# Patient Record
Sex: Male | Born: 1945 | Race: White | Hispanic: No | Marital: Married
Health system: Midwestern US, Community
[De-identification: ages and names within clinical notes are randomized; demographics above are authoritative.]

## PROBLEM LIST (undated history)

## (undated) DIAGNOSIS — E785 Hyperlipidemia, unspecified: Principal | ICD-10-CM

## (undated) DIAGNOSIS — R7301 Impaired fasting glucose: Secondary | ICD-10-CM

## (undated) DIAGNOSIS — J3801 Paralysis of vocal cords and larynx, unilateral: Secondary | ICD-10-CM

## (undated) DIAGNOSIS — D485 Neoplasm of uncertain behavior of skin: Secondary | ICD-10-CM

## (undated) DIAGNOSIS — R0602 Shortness of breath: Secondary | ICD-10-CM

## (undated) DIAGNOSIS — R195 Other fecal abnormalities: Secondary | ICD-10-CM

## (undated) DIAGNOSIS — M5412 Radiculopathy, cervical region: Secondary | ICD-10-CM

## (undated) DIAGNOSIS — D649 Anemia, unspecified: Secondary | ICD-10-CM

## (undated) DIAGNOSIS — Z01818 Encounter for other preprocedural examination: Secondary | ICD-10-CM

## (undated) DIAGNOSIS — I10 Essential (primary) hypertension: Secondary | ICD-10-CM

## (undated) DIAGNOSIS — I119 Hypertensive heart disease without heart failure: Secondary | ICD-10-CM

## (undated) DIAGNOSIS — E7800 Pure hypercholesterolemia, unspecified: Secondary | ICD-10-CM

## (undated) DIAGNOSIS — K621 Rectal polyp: Secondary | ICD-10-CM

## (undated) DIAGNOSIS — R1900 Intra-abdominal and pelvic swelling, mass and lump, unspecified site: Secondary | ICD-10-CM

## (undated) MED ORDER — AMLODIPINE 2.5 MG TAB
2.5 mg | ORAL_TABLET | Freq: Every day | ORAL | Status: DC
Start: ? — End: 2012-07-29

## (undated) MED ORDER — OMEGA-3 ACID ETHYL ESTERS 1 GRAM CAP
1 gram | ORAL_CAPSULE | Freq: Every evening | ORAL | Status: DC
Start: ? — End: 2013-08-26

## (undated) MED ORDER — TRIAMCINOLONE ACETONIDE 55 MCG NASAL SPRAY AEROSOL: 55 mcg | NASAL | Status: AC

## (undated) MED ORDER — TRIAMCINOLONE ACETONIDE 55 MCG NASAL SPRAY AEROSOL
55 mcg | NASAL | Status: DC
Start: ? — End: 2012-10-25

## (undated) MED ORDER — SIMVASTATIN 20 MG TAB
20 mg | ORAL_TABLET | Freq: Every evening | ORAL | Status: DC
Start: ? — End: 2013-02-10

## (undated) MED ORDER — POTASSIUM CHLORIDE SR 10 MEQ TAB
10 mEq | ORAL_TABLET | Freq: Every day | ORAL | Status: DC
Start: ? — End: 2013-03-31

## (undated) MED ORDER — POTASSIUM CHLORIDE SR 10 MEQ TAB
10 mEq | ORAL_TABLET | Freq: Every day | ORAL | Status: DC
Start: ? — End: 2012-09-30

## (undated) MED ORDER — CARVEDILOL 12.5 MG TAB
12.5 mg | ORAL_TABLET | Freq: Two times a day (BID) | ORAL | Status: DC
Start: ? — End: 2012-12-30

## (undated) MED ORDER — POTASSIUM CHLORIDE SR 10 MEQ TAB
10 mEq | ORAL_TABLET | Freq: Every day | ORAL | Status: DC
Start: ? — End: 2012-04-01

## (undated) MED ORDER — CARVEDILOL 12.5 MG TAB
12.5 mg | ORAL_TABLET | Freq: Two times a day (BID) | ORAL | Status: DC
Start: ? — End: 2013-11-10

## (undated) MED ORDER — NIACIN ER 500 MG TAB (NIASPAN)
500 mg | ORAL_TABLET | Freq: Every evening | ORAL | Status: DC
Start: ? — End: 2013-04-15

## (undated) MED ORDER — LOSARTAN-HYDROCHLOROTHIAZIDE 100 MG-12.5 MG TAB
ORAL_TABLET | Freq: Every day | ORAL | Status: DC
Start: ? — End: 2013-06-23

## (undated) MED ORDER — AMLODIPINE 2.5 MG TAB
2.5 mg | ORAL_TABLET | ORAL | Status: DC
Start: ? — End: 2012-03-05

## (undated) MED ORDER — AMLODIPINE 2.5 MG TAB
2.5 mg | ORAL_TABLET | Freq: Every day | ORAL | Status: DC
Start: ? — End: 2013-06-23

## (undated) MED ORDER — SIMVASTATIN 20 MG TAB
20 mg | ORAL_TABLET | Freq: Every evening | ORAL | Status: DC
Start: ? — End: 2012-08-12

## (undated) MED ORDER — SIMVASTATIN 20 MG TAB
20 mg | ORAL_TABLET | Freq: Every evening | ORAL | Status: DC
Start: ? — End: 2013-08-06

---

## 2009-04-07 LAB — PSA SCREENING (SCREENING): Prostate Specific Ag: 2.3

## 2009-08-30 NOTE — Patient Instructions (Signed)
Nuclear Stress Test    February 22, 2010 @ 9:00am arrive fifteen minutes early.    [facility] [employer] [facility] - [phone] - Please bring a list of your medications and insurance cards.     Nothing to eat or drink and NO caffeine after midnight, No medication the morning of test. Wear comfortable clothing and shoes. Please be aware that nuclear stress tests are 4-6 hours long.

## 2009-08-30 NOTE — Progress Notes (Signed)
HISTORY OF PRESENT ILLNESS  Raymond [name] is a 64 y.o. male.    HPI  He has been feeling well.  He has rare to occasional brief fluttering.  He has had no prolonged palpitations or sinking feelings.  He denies chest pain, dyspnea, orthopnea or PND.  He has had no syncope.  He denies any symptoms of TIA or amaurosis fugax.  He, as you know, has a history of known coronary artery disease and had an acute inferior wall myocardial infarction on October 12, 2001, and had stenting of the right coronary artery at that time.  In January 2006, he started experiencing frequent palpitations and dizziness with a feeling as if he was dropping down on a roller coaster.  He was admitted to [facility] on February 27, 2004 with an episode of the sinking feeling and nonsustained ventricular tachycardia.  He subsequently underwent cardiac catheterization followed by CABG X3 on March 03, 2004, which consisted of:    1.  LIMA to the LAD.  2.  Sequential SVG to the diagonal and obtuse marginal branch.    His preoperative EF was in the 45% range.  He was treated with amiodarone for nonsustained ventricular tachycardia and has had no recurrence of the sinking feeling or palpitations since the bypass surgery.  He developed a large right pleural effusion which was once drained, but he has had some reaccumulation.  Because of the sinking feeling, he had a 24-hour Holter monitor which failed to reveal a significant cardiac arrhythmia other than PVCs.  He did have the sinking feeling while he was wearing the monitor, but it did not coincide with any significant ventricular arrhythmia.        Review of Systems   Constitutional: Negative for weight loss and malaise/fatigue.   Respiratory: Negative for shortness of breath.    Cardiovascular: Positive for palpitations. Negative for chest pain, orthopnea, claudication, leg swelling and PND.   Neurological: Negative for dizziness and weakness.       Physical Exam    Constitutional: He is oriented to person, place, and time. He appears well-developed and well-nourished.   Eyes: Pupils are equal, round, and reactive to light.   Neck: Normal range of motion. Neck supple. No JVD present.   Cardiovascular: Normal rate, regular rhythm and normal heart sounds.  Exam reveals no gallop and no friction rub.    No murmur heard.  Pulmonary/Chest: Effort normal. He has no rales.   Abdominal: Soft. No tenderness.   Musculoskeletal: Normal range of motion. He exhibits no edema.   Neurological: He is alert and oriented to person, place, and time.     BP 126/72   Pulse 48   Ht 5\' 9"  (1.753 m)   Wt 175 lb (79.379 kg)   BMI 25.84 kg/m2   SpO2 98%    Past Medical History   Diagnosis Date   ??? Coronary artery disease      inferior wall myocardial infarction and stenting of the RCA 9/03; Status post CABG X3 in January 2006.   ??? Myocardial infarction, inferior wall 10/12/01     acute   ??? Hypertension    ??? Hypercholesteremia      low HDL   ??? Ventricular tachycardia, nonsustained    ??? 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 Pleural effusion on right    ??? Echocardiogram 11/08/05     LVH; inferior & inferolateral wall motion abnormality; trace MR; EF 45-50%   ??? S/P cardiac cath  02/29/04     RCA-occluded ostium; p/mLAD-85%; LAD-D1, D2-85-95%; Cx-40-50%; LVEDP 12 mmHg; EF 45%   ??? Myocardial perfusion 12/22/03     small to mod size scarring in mid to basal inf and inferolateral wall w/mild ischemia in mid/basal lateral wall only; mild hypok of inf wall; mild/mod hypok of mid lateral wall; EF 48%; mild LV dilatation         Past Surgical History   Procedure Date   ??? Stent insertion 10/12/01     right coronary artery   ??? Hx heart catheterization 03/03/04     followed by CABG X3   ??? Hx coronary artery bypass graft 03/03/04     LIMA to the LAD. Sequential SVG to the diagonal and obtuse marginal branch   ??? Hx coronary stent placement 10/12/01     RCA stented using 3.5 x 13 mm Express 2 stent         Current outpatient prescriptions    Medication Sig Dispense Refill   ??? niacin ER (NIASPAN) 500 mg tablet Take 1,500 mg by mouth nightly.       ??? aspirin 81 mg tablet Take 81 mg by mouth daily.       ??? simvastatin (ZOCOR) 20 mg tablet Take 20 mg by mouth nightly.       ??? MULTIVITAMIN PO Take 1 Tab by mouth daily.       ??? TRIAMCINOLONE ACETONIDE (NASACORT NA) 1 Spray by Nasal route as needed.       ??? omega-3 acid ethyl esters (LOVAZA) 1 gram capsule Take 1 g by mouth daily (with breakfast).       ??? amlodipine (NORVASC) 2.5 mg tablet Take 2.5 mg by mouth daily.       ??? carvedilol (COREG) 12.5 mg tablet Take 12.5 mg by mouth two (2) times daily (with meals).       ??? losartan-hydrochlorothiazide (HYZAAR) 100-12.5 mg per tablet Take 1 Tab by mouth daily.             EKG: unchanged from previous tracings, sinus bradycardia, RBBB, inf.scar    .   ASSESSMENT and PLAN  Encounter Diagnoses   Code Name Primary?   ??? 414.01 CAD, history of inferior wall myocardial infarction/status post stenting of the RCA in September 2003/status post CABG X3 in January 2006/EF 45%. Yes   ??? 427.1BD Nonsustained ventricular tachycardia    ??? 402.10 Hypertension    ??? 272.0J Hypercholesteremia    ??? 427.69BL PVC's      Raymond [name] continues to do well.  He has had no symptoms to indicate angina or cardiac decompensation.  He has had no symptoms of significant cardiac arrhythmia.  His blood pressure has been under control.  For now, I would simply continue on current medical regimen.

## 2009-09-02 MED ORDER — TRIAMCINOLONE ACETONIDE 55 MCG/ACTUATION NASAL SPRAY, AEROSOL
55 mcg/actuation | Freq: Every day | NASAL | Status: DC
Start: 2009-09-02 — End: 2010-09-19

## 2009-09-06 NOTE — Telephone Encounter (Signed)
rx called [facility]

## 2009-09-06 NOTE — Telephone Encounter (Signed)
Called in nasacort  AQ 74mcg/a  On Thursday-still not at [place]. Please call in

## 2009-09-13 MED ORDER — CARVEDILOL 12.5 MG TAB
12.5 mg | ORAL_TABLET | Freq: Two times a day (BID) | ORAL | Status: DC
Start: 2009-09-13 — End: 2010-06-28

## 2009-09-13 NOTE — Telephone Encounter (Signed)
Last seen 07-13-09.

## 2009-12-06 MED ORDER — NIACIN ER 500 MG TAB (NIASPAN)
500 mg | ORAL_TABLET | Freq: Every evening | ORAL | Status: DC
Start: 2009-12-06 — End: 2011-02-20

## 2009-12-06 NOTE — Telephone Encounter (Signed)
Last seen 07-13-09

## 2009-12-06 NOTE — Telephone Encounter (Signed)
Chart float 1

## 2009-12-06 NOTE — Telephone Encounter (Signed)
Chart to float 1

## 2010-01-04 MED ORDER — AMLODIPINE 2.5 MG TAB
2.5 mg | ORAL_TABLET | Freq: Every day | ORAL | Status: DC
Start: 2010-01-04 — End: 2010-04-04

## 2010-01-04 NOTE — Telephone Encounter (Signed)
Next ov 01-13-10

## 2010-01-07 LAB — CBC WITH AUTOMATED DIFF
ABS. BASOPHILS: 0 10*3/uL (ref 0.0–0.2)
ABS. EOSINOPHILS: 0.3 10*3/uL (ref 0.0–0.4)
ABS. LYMPHOCYTES: 1.4 10*3/uL (ref 0.7–4.5)
ABS. MONOCYTES: 0.3 10*3/uL (ref 0.1–1.0)
ABS. NEUTROPHILS: 2.5 10*3/uL (ref 1.8–7.8)
BASOPHILS: 0 % (ref 0–3)
EOSINOPHILS: 6 % (ref 0–7)
HCT: 37.7 % (ref 36.0–50.0)
HGB: 13 g/dL (ref 12.5–17.0)
LYMPHOCYTES: 31 % (ref 14–46)
MCH: 31.7 pg (ref 27.0–34.0)
MCHC: 34.5 g/dL (ref 32.0–36.0)
MCV: 92 fL (ref 80–98)
MONOCYTES: 8 % (ref 4–13)
NEUTROPHILS: 55 % (ref 40–74)
PLATELET: 138 10*3/uL — ABNORMAL LOW (ref 140–415)
RBC: 4.1 x10E6/uL (ref 4.10–5.60)
RDW: 12.4 % (ref 11.7–15.0)
WBC: 4.5 10*3/uL (ref 4.0–10.5)

## 2010-01-07 LAB — METABOLIC PANEL, COMPREHENSIVE
A-G Ratio: 1.6 (ref 0.6–2.2)
ALT (SGPT): 16 U/L (ref 10–40)
AST (SGOT): 15 U/L (ref 9–44)
Albumin: 3.9 g/dL (ref 3.5–5.3)
Alk. phosphatase: 46 U/L — ABNORMAL LOW (ref 53–128)
BUN: 17 mg/dL (ref 7–25)
Bilirubin, total: 0.6 mg/dL (ref 0.2–1.3)
CO2: 29.3 mmol/L (ref 21.0–32.0)
Calcium: 8.7 mg/dL (ref 8.1–10.4)
Chloride: 107 mEq/L (ref 94–112)
Creatinine: 1.07 mg/dL (ref 0.40–1.40)
GFR est AA: 84 mL/min/{1.73_m2} (ref >59)
GFR est non-AA: 72 mL/min/{1.73_m2} (ref >59)
GLOBULIN, TOTAL: 2.4 g/dL (ref 2.0–4.8)
Glucose: 108 mg/dL — ABNORMAL HIGH (ref 70–100)
Potassium: 3.8 mEq/L (ref 3.6–5.5)
Protein, total: 6.3 g/dL (ref 6.3–8.5)
Sodium: 139 mEq/L (ref 135–145)

## 2010-01-07 LAB — URINALYSIS W/ RFLX MICROSCOPIC
Bilirubin: NEGATIVE
Glucose: NEGATIVE
Ketone: NEGATIVE
Leukocyte Esterase: NEGATIVE
Nitrites: NEGATIVE
Protein: NEGATIVE
Specific Gravity: 1.02 (ref 1.005–1.030)
Urobilinogen: 0.2 mg/dL (ref 0.0–1.9)
pH (UA): 7 (ref 5.0–7.5)

## 2010-01-07 LAB — MICROSCOPIC EXAMINATION
Cast type: NEGATIVE
Casts: NEGATIVE /lpf
Crystal type: NEGATIVE
Crystals: NEGATIVE
Epithelial cells, renal: NEGATIVE /hpf
Mucus: NEGATIVE
Trichomonas: NEGATIVE
WBC: NEGATIVE /hpf (ref 0–?)
Yeast: NEGATIVE

## 2010-01-07 LAB — LIPID PANEL
Cholesterol, total: 132 mg/dL (ref 120–200)
HDL Cholesterol: 49 mg/dL (ref 36–85)
LDL, calculated: 73 mg/dL (ref 0–160)
Triglyceride: 51 mg/dL (ref 36–165)
VLDL, calculated: 10 mg/dL (ref 5–40)

## 2010-01-07 NOTE — Progress Notes (Addendum)
Addended byRoyetta Asal on: 01/07/2010      Modules accepted: Orders

## 2010-01-08 LAB — PSA, DIAGNOSTIC (PROSTATE SPECIFIC AG): Prostate Specific Ag: 5.9 ng/mL — ABNORMAL HIGH (ref 0.0–4.0)

## 2010-01-13 NOTE — Patient Instructions (Signed)
MyChart Activation    Thank you for requesting access to MyChart. Please follow the instructions below to securely access and download your online medical record. MyChart allows you to send messages to your doctor, view your test results, renew your prescriptions, schedule appointments, and more.    How Do I Sign Up?    1. In your internet browser, go to [email or web address].  2. Click on the First Time User? Click Here link in the Sign In box. You will see the New Member Sign Up page.  3. Enter your MyChart Access Code exactly as it appears below. You will not need to use this code after you???ve completed the sign-up process. If you do not sign up before the expiration date, you must request a new code.    MyChart Access Code: [email or web address]  Expires: 04/13/10 08:55 AM (This is the date your MyChart access code will expire)    4. Enter the last four digits of your Social Security Number (xxxx) and Date of Birth (mm/dd/yyyy) as indicated and click Submit. You will be taken to the next sign-up page.  5. Create a MyChart ID. This will be your MyChart login ID and cannot be changed, so think of one that is secure and easy to remember.  6. Create a MyChart password. You can change your password at any time.  7. Enter your Password Reset Question and Answer. This can be used at a later time if you forget your password.   8. Enter your e-mail address. You will receive e-mail notification when new information is available in MyChart.  9. Click Sign Up. You can now view and download portions of your medical record.  10. Click the Download Summary menu link to download a portable copy of your medical information.    Additional Information    If you have questions, please call [phone]. Remember, MyChart is NOT to be used for urgent needs. For medical emergencies, dial 911.

## 2010-01-13 NOTE — Progress Notes (Addendum)
Addended by: Atlee Abide on: 01/13/2010      Modules accepted: Orders, SmartSet

## 2010-01-13 NOTE — Progress Notes (Signed)
Raymond [name], sanders 24-Oct-1945, is a 64 y.o. male, who is seen today for reevaluation of hypertension, atherosclerotic heart disease, elevated PSA, and impaired fasting glucose. He is feeling well and remains active though he doesn't exercise per se. He does cut the grass and does does a lot of work at [employer] and lives with grandchildren but doesn't do any regular walking for exercise anymore. 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 His hypertension has been quite well-controlled on current medication which he takes regularly. Lipids are done well on current medicine is well, his cardiologist added Niaspan to increase his HDL cholesterol. He has arthroscopic heart disease having had an inferior infarction followed by a stent in September of 2003 and then develop more symptoms and required coronary artery bypass surgery in January of 2006. He has done well since then and will be having a stress test next month at the office of his cardiologist. He was found to have elevated PSA of 6 a year ago and is seen a urologist since then. PSA has been lower since then and he has a followup appointment tomorrow with Dr.Auman.      Past Medical History   Diagnosis Date   ??? Coronary artery disease      inferior wall myocardial infarction and stenting of the RCA 9/03; Status post CABG X3 in January 2006.   ??? Myocardial infarction, inferior wall 10/12/01     acute   ??? Hypertension    ??? Hypercholesteremia      low HDL   ??? Ventricular tachycardia, nonsustained    ??? 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 Pleural effusion on right    ??? Echocardiogram 11/08/05     LVH; inferior & inferolateral wall motion abnormality; trace MR; EF 45-50%   ??? S/P cardiac cath 02/29/04     RCA-occluded ostium; p/mLAD-85%; LAD-D1, D2-85-95%; Cx-40-50%; LVEDP 12 mmHg; EF 45%   ??? Myocardial perfusion 12/22/03      small to mod size scarring in mid to basal inf and inferolateral wall w/mild ischemia in mid/basal lateral wall only; mild hypok of inf wall; mild/mod hypok of mid lateral wall; EF 48%; mild LV dilatation   ??? BPH w urinary obs/LUTS    ??? Elevated prostate specific antigen (PSA)    ??? Microscopic hematuria    ??? Hypertension    ??? Right renal cyst    ??? ASHD (arteriosclerotic heart disease)        Current outpatient prescriptions   Medication Sig Dispense Refill   ??? amLODIPine (NORVASC) 2.5 mg tablet Take 1 Tab by mouth daily.  30 Tab  2   ??? niacin ER (NIASPAN) 500 mg tablet Take 3 Tabs by mouth nightly.  270 Tab  4   ??? carvedilol (COREG) 12.5 mg tablet Take 1 Tab by mouth two (2) times a day.  60 Tab  9   ??? Triamcinolone Acetonide (NASACORT AQ) 55 mcg/Actuation Aero nasal spray 2 Sprays by Nasal route daily.  1 Container  11   ??? aspirin 81 mg tablet Take 81 mg by mouth daily.       ??? simvastatin (ZOCOR) 20 mg tablet Take 20 mg by mouth nightly.       ??? MULTIVITAMIN PO Take 1 Tab by mouth daily.       ??? omega-3 acid ethyl esters (LOVAZA) 1 gram capsule Take 1 g by mouth daily (with  breakfast).       ??? losartan-hydrochlorothiazide (HYZAAR) 100-12.5 mg per tablet Take 1 Tab by mouth daily.           He doesn't drink alcohol or smoke cigarettes. He has no drug allergies but is allergic to bee stings.    He is married and retired as noted above.    Review of systems his weight is basically unchanged over the last year, he has no HEENT complaints and does see an eye doctor each year, is no dyspnea on exertion cough PND orthopnea, no exertional chest discomfort and no edema. He also has no nausea vomiting bowel complaints blood in his stools black stools or urinary symptoms.    Visit Vitals   Item Reading   ??? BP 144/68   ??? Pulse 68   ??? Temp(Src) 98.4 ??F (36.9 ??C) (Oral)   ??? Resp 16   ??? Ht 5\' 8"  (1.727 m)   ??? Wt 178 lb 8 oz (80.967 kg)   ??? BMI 27.14 kg/m2        Pupils are equal and react to light, extraocular movements are full, fundi are not checked. Tympanic membranes appear normal. Oral cavity no lesions. Neck reveals no adenopathy or thyromegaly. Carotids are 2+ no bruits. Lungs are clear to percussion. No wheezing rales or rhonchi on auscultation. Heart reveals a regular rhythm with no murmur gallop click or rub. Apical impulse is in the fifth interspace in the midclavicular line. Median sternotomy scar is well-healed. Abdomen is soft and nontender with no postauricular masses and no bruits. Extremities reveal no clubbing cyanosis or edema. Pulses are 2+. Rectal exam was not done that will be done tomorrow by his urologist.    Results for orders placed in visit on 01/07/10   CBC WITH AUTOMATED DIFF   Component Value Range   ??? WBC 4.5  4.0 - 10.5 (x10E3/uL)   ??? RBC 4.10  4.10 - 5.60 (x10E6/uL)   ??? HGB 13.0  12.5 - 17.0 (g/dL)   ??? HCT 37.7  36.0 - 50.0 (%)   ??? MCV 92  80 - 98 (fL)   ??? MCH 31.7  27.0 - 34.0 (pg)   ??? MCHC 34.5  32.0 - 36.0 (g/dL)   ??? RDW 12.4  11.7 - 15.0 (%)   ??? PLATELET 138 (*) 140 - 415 (x10E3/uL)   ??? NEUTROPHILS 55  40 - 74 (%)   ??? LYMPHOCYTES 31  14 - 46 (%)   ??? MONOCYTES 8  4 - 13 (%)   ??? EOSINOPHILS 6  0 - 7 (%)   ??? BASOPHILS 0  0 - 3 (%)   ??? ABSOLUTE NEUTS 2.5  1.8 - 7.8 (x10E3/uL)   ??? ABSOLUTE LYMPHS 1.4  0.7 - 4.5 (x10E3/uL)   ??? ABSOLUTE MONOS 0.3  0.1 - 1.0 (x10E3/uL)   ??? ABSOLUTE EOSINS 0.3  0.0 - 0.4 (x10E3/uL)   ??? ABSOLUTE BASOS 0.0  0.0 - 0.2 (x10E3/uL)   ??? Hematology Comments: CANCELED     METABOLIC PANEL, COMPREHENSIVE   Component Value Range   ??? Glucose 108 (*) 70 - 100 (mg/dL)   ??? BUN 17  7 - 25 (mg/dL)   ??? Creatinine 1.07  0.40 - 1.40 (mg/dL)   ??? GFR est non-AA 72  >59 (mL/min/1.73)   ??? GFR est AA 84  >59 (mL/min/1.73)   ??? Sodium 139  135 - 145 (mEq/L)   ??? Potassium 3.8  3.6 - 5.5 (mEq/L)   ???  Chloride 107  94 - 112 (mEq/L)   ??? CO2 29.3  21.0 - 32.0 (mmol/L)   ??? Calcium 8.7  8.1 - 10.4 (mg/dL)   ??? Protein, total 6.3  6.3 - 8.5 (g/dL)    ??? Albumin 3.9  3.5 - 5.3 (g/dL)   ??? GLOBULIN, TOTAL 2.4  2.0 - 4.8 (g/dL)   ??? A-G Ratio 1.6  0.6 - 2.2    ??? Bilirubin, total 0.6  0.2 - 1.3 (mg/dL)   ??? Alk. phosphatase 46 (*) 53 - 128 (U/L)   ??? AST 15  9 - 44 (U/L)   ??? ALT 16  10 - 40 (U/L)   LIPID PANEL   Component Value Range   ??? Cholesterol, total 132  120 - 200 (mg/dL)   ??? Triglyceride 51  36 - 165 (mg/dL)   ??? HDL Cholesterol 49  36 - 85 (mg/dL)   ??? VLDL, calculated 10  5 - 40 (mg/dL)   ??? LDL, calculated 73  0 - 160 (mg/dL)   URINALYSIS W/ RFLX MICROSCOPIC   Component Value Range   ??? Specific Gravity 1.020  1.005 - 1.030    ??? pH 7.0  5.0 - 7.5    ??? Color Yellow  Yellow    ??? Appearance Clear  Clear    ??? Leukocyte Esterase Negative  Negative    ??? Protein Negative  Negative/Trace    ??? Glucose Negative  Negative    ??? GLUCOSE REFLEX CANCELED     ??? Ketone Negative  Negative    ??? Blood TRACE  Negative    ??? Bilirubin Negative  Negative    ??? Urobilinogen 0.2  0.0 - 1.9 (mg/dL)   ??? Nitrites Negative  Negative    ??? Microscopic Examination See additional order     PROSTATE SPECIFIC AG (PSA)   Component Value Range   ??? Prostate Specific Ag 5.9 (*) 0.0 - 4.0 (ng/mL)   MICROSCOPIC EXAMINATION   Component Value Range   ??? WBC Negative  0 -  5 (/hpf)   ??? RBC 0-3  0 -  3 (/hpf)   ??? Epithelial cells 0-10  0 - 10 (/hpf)   ??? Epithelials-renal Negative  None seen (/hpf)   ??? Casts Negative  None seen (/lpf)   ??? Cast type Negative  N/A    ??? Crystals Negative  N/A    ??? Crystal type Negative  N/A    ??? Mucus Negative  Not Estab.    ??? Bacteria Moderate (*) None seen/Few    ??? Yeast Negative  None seen    ??? Trichomonas Negative  None seen    ??? Comment CANCELED          Assessment: #1. He now has impaired fasting glucose, we will continue healthy diet and maintain a normal weight and we will recheck a glucose and check a hemoglobin A1c in 6 months. #2. Atherosclerotic heart disease status post inferior infarction followed by stents and then nearly 5 years ago by bypass surgery, now asymptomatic but schedule for a stress test next month. #3. Hyperlipidemia is doing well on simvastatin. He'll continue 20 mg of simvastatin each evening and I will leave Niaspan to the discretion of his cardiologist, though a large randomized study showed that it does not reduce risk of coronary events.  #4. Elevated PSA is about the same as it was a year ago, he will see his urologist tomorrow. #5. His never had a colonoscopy that we discussed  this a few years ago in some detail as to the advantages and who he might see for colonoscopy so I gave him the same information again today the reasons why he should have it the procedure involved and the names and phone numbers for 5 local qualified endoscopists.    The patient did get a flu shot today and after a discussion with him he also agreed to Pneumovax. Risks and benefits were discussed in detail.    He'll continue as above and we will plan followup with lab in 6 months.

## 2010-01-14 LAB — AMB POC URINALYSIS DIP STICK AUTO W/O MICRO
Bilirubin (UA POC): NEGATIVE
Glucose (UA POC): NEGATIVE
Ketones (UA POC): NEGATIVE
Leukocyte esterase (UA POC): NEGATIVE
Nitrites (UA POC): NEGATIVE
Protein (UA POC): NEGATIVE mg/dL
Specific gravity (UA POC): 1.02 (ref 1.001–1.035)
Urobilinogen (UA POC): 1
pH (UA POC): 6 (ref 4.6–8.0)

## 2010-01-14 NOTE — Progress Notes (Signed)
Raymond [name] returns for followup of a variable PSA. Last December he had a rise in his PSA but came down to his usual mid 2 range in March. Recent repeat was 5.9. He has no change in his voiding symptoms. He has had no urinary tract infections, prostatitis or retention.     His urine today is clear.    Prostates estimated 20 g, smooth without nodules. No rectal masses.    Impression: Elevated PSA    Plan: Recommend recheck in 6 months. PSA variability and lack of specificity were discussed.

## 2010-02-22 NOTE — Procedures (Signed)
[facility]   [facility]   [address]   [place], [place] [address]   NUCLEAR CARDIOLOGY REPORT    PATIENT: Raymond [name], Raymond [name]  MRN: [id number]  BILLING: [id number]  DATE: 02/22/2010  LOCATION:  REFERRING:  DICTATING: Synetta Shadow, MD      EXERCISE NUCLEAR SCAN    PROCEDURES PERFORMED: Exercise nuclear scan.    Baseline electrocardiogram shows sinus bradycardia, rate of 55 beats per  minute. Right bundle branch block pattern is noted. The patient exercised 7  minutes and 59 seconds on Bruce protocol stopping secondary to fatigue. No  chest pain was noted.    FINDINGS  1. ST segment changes: None.  2. Dysrhythmia: Occasional PVCs.  3. Chest pains: None.  4. Heart rate response: Normal.  5. Blood pressure response: Normal.    TREADMILL CONCLUSION: This is a nondiagnostic exercise stress test from  electrocardiographic standpoint due to bundle branch block pattern noted at  baseline.    MYOCARDIAL NUCLEAR PERFUSION STUDY FINDINGS  1. Perfusion imaging shows no evidence of reversible defect. However, there  is inferobasal fixed defect noted at rest as well as during stress. This is  likely consistent with diaphragmatic attenuation. However, prior  inferobasal infarct cannot be excluded.  2. Gated SPECT imaging shows upper normal left ventricular chamber size and  normal left ventricular systolic function with estimated ejection fraction  of 55%. There may be very minimal inferobasal hypokinesis noted.    CONCLUSIONS  1. Nondiagnostic exercise stress test from electrocardiographic standpoint  due to baseline bundle branch block pattern.  2. Equivocal perfusion study.  3. Perfusion imaging shows no evidence of significant reversible defect.  However, there is fixed defect involving the inferior base noted at rest as  well as during stress. This can be consistent with diaphragmatic  attenuation versus prior inferobasal injury.   4. Gated SPECT imaging shows upper normal to mildly increased left  ventricular chamber size with overall normal left ventricular systolic  function with estimated ejection fraction of 55%. There may be mild  inferobasal hypokinesis noted.                   STAGE MINUTES MPH GRADE METS HR BP   ?? ?? ?? ?? ?? ?? ??   ?? ?? ?? ?? ?? ?? ??   ?? ?? ?? ?? ?? ?? ??   ?? ?? ?? ?? ?? ?? ??   ?? ?? ?? ?? ?? ?? ??   ?? ?? ?? ?? ?? ?? ??   ?? ?? ?? ?? ?? ?? ??   ?? ?? ?? ?? ?? ?? ??                     Electronically Signed   Synetta Shadow, MD 02/28/2010 13:38   Milas Kocher Maryjo [place], MD    [facility]  D: 02/22/2010  CScript:02/22/2010 5:14 P  CQDocID #: [id number] CScriptDoc #:  [id number]   cc: DAE B. Junius Finner, MD   Synetta Shadow, MD   [name], MD  DR. Theodoro Grist

## 2010-02-22 NOTE — Procedures (Signed)
Procedures signed by  at 02/28/10  1338                 Author: Synetta Shadow, MD  Service: --  Author Type: Physician       Filed: 02/28/10 1339  Date of Service: 02/22/10 1714  Status: Signed          Editor: Synetta Shadow, MD (Physician)            Procedure Orders        1. TRANSCRIBED NUCLEAR MEDICINE [[id number]] ordered by  at 02/22/10 1714                         <!--EPICS--> <BR>                         [facility]<BR>                           [facility]<BR>                               [address]<BR>                         [place], [place] [address]<BR>                          NUCLEAR CARDIOLOGY REPORT<BR> <BR> PATIENT:    Raymond [name], Raymond [name]<BR> MRN:        [id number]<BR> BILLING:    [id number]<HQ> DATE:       02/22/2010<BR>  LOCATION:<BR> REFERRING:<BR> DICTATING:  Kihanna Kamiya K. Chrislynn Mosely, MD<BR> <BR> <BR> EXERCISE NUCLEAR SCAN<BR> <BR> PROCEDURES PERFORMED: Exercise nuclear scan.<BR> <BR> Baseline electrocardiogram shows sinus bradycardia, rate of 55 beats per<BR> minute. Right bundle  branch block pattern is noted. The patient exercised 7<BR> minutes and 59 seconds on Bruce protocol stopping secondary to fatigue. No<BR> chest pain was noted.<BR> <BR> FINDINGS<BR> 1. ST segment changes: None.<BR> 2. Dysrhythmia: Occasional PVCs.<BR>  3. Chest pains: None.<BR> 4. Heart rate response: Normal.<BR> 5. Blood pressure response: Normal.<BR> <BR> TREADMILL CONCLUSION: This is a nondiagnostic exercise stress test from<BR> electrocardiographic standpoint due to bundle branch block pattern noted  at<BR> baseline.<BR> <BR> MYOCARDIAL NUCLEAR PERFUSION STUDY FINDINGS<BR> 1. Perfusion imaging shows no evidence of reversible defect. However, there<BR> is inferobasal fixed defect noted at rest as well as during stress. This is<BR> likely consistent  with diaphragmatic attenuation. However, prior<BR> inferobasal infarct cannot be excluded.<BR> 2. Gated SPECT imaging shows upper normal left  ventricular chamber size and<BR> normal left ventricular systolic function with estimated ejection fraction<BR>  of 55%. There may be very minimal inferobasal hypokinesis noted.<BR> <BR> CONCLUSIONS<BR> 1. Nondiagnostic exercise stress test from electrocardiographic standpoint<BR> due to baseline bundle branch block pattern.<BR> 2. Equivocal perfusion study.<BR>  3. Perfusion imaging shows no evidence of significant reversible defect.<BR> However, there is fixed defect involving the inferior base noted at rest as<BR> well as during stress. This can be consistent with diaphragmatic<BR> attenuation versus prior  inferobasal injury.<BR> 4. Gated SPECT imaging shows upper normal to mildly increased left<BR> ventricular chamber size with overall normal left ventricular systolic<BR> function with estimated ejection fraction of 55%. There may be mild<BR> inferobasal  hypokinesis noted.<BR> <BR> <BR> <BR> <BR> <BR> <BR> <BR> <BR>   STAGE    MINUTES  MPH       GRADE       METS        HR      BP<BR>    ??         ??         ??         ??         ??         ??      ??<BR>    ??         ??         ??         ??          ??         ??      ??<BR>    ??         ??         ??         ??         ??         ??      ??<BR>    ??         ??         ??         ??         ??         ??      ??<BR>    ??         ??         ??         ??         ??         ??      ??<BR>     ??         ??         ??         ??         ??         ??      ??<BR>    ??         ??         ??         ??         ??         ??      ??<BR>    ??         ??         ??         ??         ??         ??      ??<BR> <BR> <BR> <BR> <BR> <BR> <BR> <BR>  <BR> <BR>      Electronically Signed<BR>      Jayley Hustead Maryjo Rochester, MD 02/28/2010 13:38<BR>                                      Elyssia Strausser K. Gjon Letarte, MD<BR> <BR> JKC:WMX<BR> D: 02/22/2010<BR> CScript:02/22/2010  5:14 P<BR> CQDocID #: [id number]   CScriptDoc #:<BR> [id number]<BR>   cc:  DAE B. CHOUGH, MD<BR>       Robynne Roat K. Danel Studzinski, MD<BR>        [name], MD<BR> DR. CHO<BR> <!--EPICE-->

## 2010-02-28 NOTE — Telephone Encounter (Signed)
LMOM for patient to call me back.  ++++++++++++++++++++  Looks good.    Lavonna Monarch, RN    +++++++++++++++++    Please see NUC on Mr. Arris Meyn, seeing Dr. Junius Finner on 03/24/10

## 2010-03-01 NOTE — Telephone Encounter (Signed)
Patient called back today.  Made him aware of results.  Will keep Feb. Follow up with Dr. Junius Finner as planned.

## 2010-03-02 NOTE — Telephone Encounter (Addendum)
Let him know I have reviewd it and it's ok  ----- Message -----  From: Delilah Shan  Sent: 02/28/2010 4:10 PM  To: Edrick Oh, MD

## 2010-03-24 NOTE — Progress Notes (Signed)
HISTORY OF PRESENT ILLNESS  Raymond [name] is a 65 y.o. male.    HPI  He has been feeling well.  He has had occasional brief palpitation but no prolonged palpitation.  He has had no sinking feelings.  He denies chest pain, dyspnea, orthopnea, or PND.  He has had no dizziness or syncope.  He has had no symptoms to indicate TIA or amaurosis fugax.  Overall, he has been feeling well, and he has remained active.    He has a history of known coronary artery disease and had an acute inferior wall myocardial infarction on October 12, 2001, and had stenting of the right coronary artery at that time. In January 2006, he started experiencing frequent palpitations and dizziness with a feeling as if he was dropping down on a roller coaster. He was admitted to [facility] on February 27, 2004 with an episode of the sinking feeling and nonsustained ventricular tachycardia. He subsequently underwent cardiac catheterization followed by CABG X3 on March 03, 2004, which consisted of:   1. LIMA to the LAD.   2. Sequential SVG to the diagonal and obtuse marginal branch.   His preoperative EF was in the 45% range. He was treated with amiodarone for nonsustained ventricular tachycardia and has had no recurrence of the sinking feeling or palpitations since the bypass surgery. He developed a large right pleural effusion which was once drained, but he has had some reaccumulation. Because of the sinking feeling, he had a 24-hour Holter monitor which failed to reveal a significant cardiac arrhythmia other than PVCs. He did have the sinking feeling while he was wearing the monitor, but it did not coincide with any significant ventricular arrhythmia.  He had the follow up stress nuclear cardiac imaging on 02/17/2010 which demonstrated fixed defect involving the inferior base with no significant ischemia.  Ejection fraction was estimated at 55%.        Review of Systems    Constitutional: Negative for weight loss and malaise/fatigue.   Respiratory: Negative for shortness of breath.    Cardiovascular: Positive for palpitations. Negative for chest pain, orthopnea, claudication, leg swelling and PND.   Neurological: Negative for dizziness and weakness.       Physical Exam   Constitutional: He is oriented to person, place, and time. He appears well-developed and well-nourished.   HENT:   Head: Normocephalic.   Eyes: Conjunctivae are normal. Pupils are equal, round, and reactive to light.   Neck: Normal range of motion. Neck supple. No JVD present.   Cardiovascular: Regular rhythm, S1 normal and S2 normal.   No extrasystoles are present. Bradycardia present.  PMI is not displaced.  Exam reveals no gallop, no S3 and no friction rub.    No murmur heard.  Pulses:       Carotid pulses are 3+ on the right side, and 3+ on the left side.  Pulmonary/Chest: Effort normal. He has no rales.   Abdominal: Soft. No tenderness.   Musculoskeletal: Normal range of motion. He exhibits no edema.   Neurological: He is alert and oriented to person, place, and time.   Skin: Skin is warm and dry.     BP 130/72   Pulse 44   Ht 5\' 8"  (1.727 m)   Wt 184 lb (83.462 kg)   BMI 27.98 kg/m2   SpO2 99%    Past Medical History   Diagnosis Date   ??? Coronary artery disease      inferior wall myocardial infarction and  stenting of the RCA 9/03; Status post CABG X3 in January 2006.   ??? Myocardial infarction, inferior wall 10/12/01     acute   ??? Hypertension    ??? Hypercholesteremia      low HDL   ??? Ventricular tachycardia, nonsustained    ??? 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 Pleural effusion on right    ??? Echocardiogram 11/08/05     LVH; inferior & inferolateral wall motion abnormality; trace MR; EF 45-50%   ??? S/P cardiac cath 02/29/04     RCA-occluded ostium; p/mLAD-85%; LAD-D1, D2-85-95%; Cx-40-50%; LVEDP 12 mmHg; EF 45%   ??? Myocardial perfusion 02/22/2010      Fixed defect in inferior base c/w artifact vs prior inferobasal injury. LVE. EF 55%. MIld inferobasal HK noted.   ??? BPH w urinary obs/LUTS    ??? Elevated prostate specific antigen (PSA)    ??? Microscopic hematuria    ??? Hypertension    ??? Right renal cyst    ??? ASHD (arteriosclerotic heart disease)        Past Surgical History   Procedure Date   ??? Stent insertion 10/12/01     right coronary artery   ??? Hx heart catheterization 03/03/04     followed by CABG X3   ??? Hx coronary artery bypass graft 03/03/04     LIMA to the LAD. Sequential SVG to the diagonal and obtuse marginal branch   ??? Hx coronary stent placement 10/12/01     RCA stented using 3.5 x 13 mm Express 2 stent   ??? Hx tonsillectomy        Current Outpatient Prescriptions   Medication Sig Dispense Refill   ??? amLODIPine (NORVASC) 2.5 mg tablet Take 1 Tab by mouth daily.  30 Tab  2   ??? niacin ER (NIASPAN) 500 mg tablet Take 3 Tabs by mouth nightly.  270 Tab  4   ??? carvedilol (COREG) 12.5 mg tablet Take 1 Tab by mouth two (2) times a day.  60 Tab  9   ??? Triamcinolone Acetonide (NASACORT AQ) 55 mcg/Actuation Aero nasal spray 2 Sprays by Nasal route daily.  1 Container  11   ??? aspirin 81 mg tablet Take 81 mg by mouth daily.       ??? simvastatin (ZOCOR) 20 mg tablet Take 20 mg by mouth nightly.       ??? MULTIVITAMIN PO Take 1 Tab by mouth daily.       ??? omega-3 acid ethyl esters (LOVAZA) 1 gram capsule Take 1 g by mouth every evening.       ??? losartan-hydrochlorothiazide (HYZAAR) 100-12.5 mg per tablet Take 1 Tab by mouth daily.           EKG: unchanged from previous tracings, sinus bradycardia, RBBB, inf.scar  .   ASSESSMENT and PLAN  Encounter Diagnoses   Name Primary?   ??? CAD, history of inferior wall myocardial infarction/status post stenting of the RCA in September 2003/status post CABG X3 in January 2006/EF 45%. Yes   ??? Nonsustained ventricular tachycardia    ??? Hypertension    ??? Hypercholesteremia    ??? PVC's (premature ventricular contractions)     He has been doing quite well.  He has had no symptoms of angina.  He has had no significant cardiac arrhythmia based on his symptoms.  His blood pressure has been under control.  His cholesterol profile is very satisfactory with the total cholesterol 132, HDL 49, and LDL 73.  For  now, I would simply continue on current medical regimen.

## 2010-04-04 MED ORDER — AMLODIPINE 2.5 MG TAB
2.5 mg | ORAL_TABLET | Freq: Every day | ORAL | Status: DC
Start: 2010-04-04 — End: 2010-10-31

## 2010-06-28 MED ORDER — CARVEDILOL 12.5 MG TAB
12.5 mg | ORAL_TABLET | Freq: Two times a day (BID) | ORAL | Status: DC
Start: 2010-06-28 — End: 2011-06-20

## 2010-06-28 MED ORDER — OMEGA-3 ACID ETHYL ESTERS 1 GRAM CAP
1 gram | ORAL_CAPSULE | Freq: Every evening | ORAL | Status: DC
Start: 2010-06-28 — End: 2010-08-11

## 2010-06-28 MED ORDER — LOSARTAN-HYDROCHLOROTHIAZIDE 100 MG-12.5 MG TAB
ORAL_TABLET | Freq: Every day | ORAL | Status: DC
Start: 2010-06-28 — End: 2010-08-11

## 2010-06-28 NOTE — Progress Notes (Signed)
Raymond [name], Raymond [name] 21-Nov-1945, is a 66 y.o. male, who is seen today for evaluation after recent fall. He was carrying 2 bags full of supplies at his church when he tripped on some uneven ground and fell into a break wall scraping his left knee left arm and has had some abdominal discomfort since then as well. He was evaluated at the emergency room and no x-rays were done except his left knee. He is walking well. He had been using Neosporin until yesterday. He plans to be going to [place] soon for 2-3 weeks and did get a tetanus shot when he was evaluated for this injury.  Past Medical History   Diagnosis Date   ??? Coronary artery disease      inferior wall myocardial infarction and stenting of the RCA 9/03; Status post CABG X3 in January 2006.   ??? Myocardial infarction, inferior wall 10/12/01     acute   ??? Hypertension    ??? Hypercholesteremia      low HDL   ??? Ventricular tachycardia, nonsustained    ??? 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 Pleural effusion on right    ??? Echocardiogram 11/08/05     LVH; inferior & inferolateral wall motion abnormality; trace MR; EF 45-50%   ??? S/P cardiac cath 02/29/04     RCA-occluded ostium; p/mLAD-85%; LAD-D1, D2-85-95%; Cx-40-50%; LVEDP 12 mmHg; EF 45%   ??? Myocardial perfusion 02/22/2010     Fixed defect in inferior base c/w artifact vs prior inferobasal injury. LVE. EF 55%. MIld inferobasal HK noted.   ??? Hypertrophy of prostate with urinary obstruction and other lower urinary tract symptoms (LUTS)    ??? Elevated prostate specific antigen (PSA)    ??? Microscopic hematuria    ??? Hypertension    ??? Right renal cyst    ??? ASHD (arteriosclerotic heart disease)      Current Outpatient Prescriptions   Medication Sig Dispense Refill   ??? carvedilol (COREG) 12.5 mg tablet Take 1 Tab by mouth two (2) times a day.  60 Tab  11   ??? losartan-hydrochlorothiazide (HYZAAR) 100-12.5 mg per tablet Take 1 Tab by mouth daily.  30 Tab  11    ??? omega-3 acid ethyl esters (LOVAZA) 1 gram capsule Take 1 Cap by mouth every evening.  30 Cap  11   ??? amLODIPine (NORVASC) 2.5 mg tablet Take 1 Tab by mouth daily.  30 Tab  6   ??? niacin ER (NIASPAN) 500 mg tablet Take 3 Tabs by mouth nightly.  270 Tab  4   ??? Triamcinolone Acetonide (NASACORT AQ) 55 mcg/Actuation Aero nasal spray 2 Sprays by Nasal route daily.  1 Container  11   ??? aspirin 81 mg tablet Take 81 mg by mouth daily.       ??? simvastatin (ZOCOR) 20 mg tablet Take 20 mg by mouth nightly.       ??? MULTIVITAMIN PO Take 1 Tab by mouth daily.         Visit Vitals   Item Reading   ??? BP 132/78   ??? Pulse 80   ??? Temp(Src) 98.3 ??F (36.8 ??C) (Oral)   ??? Resp 16   ??? Ht 5\' 8"  (1.727 m)   ??? Wt 82.555 kg (182 lb)   ??? BMI 27.67 kg/m2     Neck is supple with no discomfort with range of motion. Lungs are clear with no rub wheezes rhonchi or rales. Heart reveals a regular rhythm with no murmur gallop  or rub. Abdomen is not distended and there is no tympany and no tenderness and no hepatosplenomegaly or masses. No visible bruising anywhere but he does have a superficial abrasion on the upper left arm and over the lower portion of the anterior knee. Both areas are totally sealed and neither is apparently infected.    Assessment: Recent fall with some residual abdominal pain but with bowels moving properly. #2. Abrasions are relatively superficial and no longer weeping. They will not get infected now and he may stop using Neosporin and just normal showering.    Followup as previously scheduled within the next few weeks.

## 2010-06-28 NOTE — Patient Instructions (Signed)
MyChart Activation    Thank you for requesting access to MyChart. Please follow the instructions below to securely access and download your online medical record. MyChart allows you to send messages to your doctor, view your test results, renew your prescriptions, schedule appointments, and more.    How Do I Sign Up?    1. In your internet browser, go to [email or web address]  2. Click on the First Time User? Click Here link in the Sign In box. You will be redirect to the New Member Sign Up page.  3. Enter your MyChart Access Code exactly as it appears below. You will not need to use this code after you???ve completed the sign-up process. If you do not sign up before the expiration date, you must request a new code.    MyChart Access Code: Not generated  Current MyChart Status: Active (This is the date your MyChart access code will expire)    4. Enter the last four digits of your Social Security Number (xxxx) and Date of Birth (mm/dd/yyyy) as indicated and click Submit. You will be taken to the next sign-up page.  5. Create a MyChart ID. This will be your MyChart login ID and cannot be changed, so think of one that is secure and easy to remember.  6. Create a MyChart password. You can change your password at any time.  7. Enter your Password Reset Question and Answer. This can be used at a later time if you forget your password.   8. Enter your e-mail address. You will receive e-mail notification when new information is available in MyChart.  9. Click Sign Up. You can now view and download portions of your medical record.  10. Click the Download Summary menu link to download a portable copy of your medical information.    Additional Information    Remember, MyChart is NOT to be used for urgent needs. For medical emergencies, dial 911.

## 2010-07-28 LAB — LIPID PANEL
Cholesterol, total: 142 mg/dL (ref 120–200)
HDL Cholesterol: 57 mg/dL (ref 36–85)
LDL, calculated: 73 mg/dL (ref 0–160)
Triglyceride: 58 mg/dL (ref 36–165)
VLDL, calculated: 12 mg/dL (ref 5–40)

## 2010-07-28 LAB — ALT: ALT (SGPT): 17 U/L (ref 10–40)

## 2010-07-28 LAB — GLUCOSE, RANDOM: Glucose: 95 mg/dL (ref 70–100)

## 2010-08-03 NOTE — Patient Instructions (Signed)
MyChart Activation    Thank you for requesting access to MyChart. Please follow the instructions below to securely access and download your online medical record. MyChart allows you to send messages to your doctor, view your test results, renew your prescriptions, schedule appointments, and more.    How Do I Sign Up?    1. In your internet browser, go to [email or web address]  2. Click on the First Time User? Click Here link in the Sign In box. You will be redirect to the New Member Sign Up page.  3. Enter your MyChart Access Code exactly as it appears below. You will not need to use this code after you???ve completed the sign-up process. If you do not sign up before the expiration date, you must request a new code.    MyChart Access Code: Not generated  Current MyChart Status: Active (This is the date your MyChart access code will expire)    4. Enter the last four digits of your Social Security Number (xxxx) and Date of Birth (mm/dd/yyyy) as indicated and click Submit. You will be taken to the next sign-up page.  5. Create a MyChart ID. This will be your MyChart login ID and cannot be changed, so think of one that is secure and easy to remember.  6. Create a MyChart password. You can change your password at any time.  7. Enter your Password Reset Question and Answer. This can be used at a later time if you forget your password.   8. Enter your e-mail address. You will receive e-mail notification when new information is available in MyChart.  9. Click Sign Up. You can now view and download portions of your medical record.  10. Click the Download Summary menu link to download a portable copy of your medical information.    Additional Information    If you have questions, please visit the Frequently Asked Questions section of the MyChart website at [email or web address] Remember, MyChart is NOT to be used for urgent needs. For medical emergencies, dial 911.

## 2010-08-03 NOTE — Progress Notes (Signed)
Raymond [name], malkiewicz [birth year], is a 65 y.o. male, who is seen today for Reevaluation of his recurrent disease, hypertension and hyperlipidemia. Elevated PSA several months ago and will be seeing his urologist for a period he takes his medicines correctly. Recently returned from vacation in [place]. He continues his active lifestyle and healthy diet. No chest discomfort or dyspnea.    Past Medical History   Diagnosis Date   ??? Coronary artery disease      inferior wall myocardial infarction and stenting of the RCA 9/03; Status post CABG X3 in January 2006.   ??? Myocardial infarction, inferior wall 10/12/01     acute   ??? Hypertension    ??? Hypercholesteremia      low HDL   ??? Ventricular tachycardia, nonsustained    ??? 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 Pleural effusion on right    ??? Echocardiogram 11/08/05     LVH; inferior & inferolateral wall motion abnormality; trace MR; EF 45-50%   ??? S/P cardiac cath 02/29/04     RCA-occluded ostium; p/mLAD-85%; LAD-D1, D2-85-95%; Cx-40-50%; LVEDP 12 mmHg; EF 45%   ??? Myocardial perfusion 02/22/2010     Fixed defect in inferior base c/w artifact vs prior inferobasal injury. LVE. EF 55%. MIld inferobasal HK noted.   ??? Hypertrophy of prostate with urinary obstruction and other lower urinary tract symptoms (LUTS)    ??? Elevated prostate specific antigen (PSA)    ??? Microscopic hematuria    ??? Hypertension    ??? Right renal cyst    ??? ASHD (arteriosclerotic heart disease)      Current Outpatient Prescriptions   Medication Sig Dispense Refill   ??? carvedilol (COREG) 12.5 mg tablet Take 1 Tab by mouth two (2) times a day.  60 Tab  11   ??? losartan-hydrochlorothiazide (HYZAAR) 100-12.5 mg per tablet Take 1 Tab by mouth daily.  30 Tab  11   ??? omega-3 acid ethyl esters (LOVAZA) 1 gram capsule Take 1 Cap by mouth every evening.  30 Cap  11   ??? amLODIPine (NORVASC) 2.5 mg tablet Take 1 Tab by mouth daily.  30 Tab  6   ??? niacin ER (NIASPAN) 500 mg tablet Take 3 Tabs by mouth nightly.  270 Tab  4    ??? Triamcinolone Acetonide (NASACORT AQ) 55 mcg/Actuation Aero nasal spray 2 Sprays by Nasal route daily.  1 Container  11   ??? aspirin 81 mg tablet Take 81 mg by mouth daily.       ??? simvastatin (ZOCOR) 20 mg tablet Take 20 mg by mouth nightly.       ??? MULTIVITAMIN PO Take 1 Tab by mouth daily.         Visit Vitals   Item Reading   ??? BP 118/66   ??? Pulse 58   ??? Temp(Src) 98.3 ??F (36.8 ??C) (Oral)   ??? Resp 16   ??? Ht 5\' 9"  (1.753 m)   ??? Wt 183 lb (83.008 kg)   ??? BMI 27.02 kg/m2     Carotids are 2+ without bruits. Lungs are clear to percussion. Good breath sounds with no wheezing rales or rhonchi. Heart reveals a regular rhythm with normal S1 and S2 and no murmur gallop click or rub. Is a well-healed median sternotomy scar. Abdomen is soft and nontender with no perisplenic or masses no bruits. Extremities reveal no edema. Pulses are 2+.    Results for orders placed in visit on 07/28/10   LIPID PANEL  Component Value Range    Cholesterol, total 142  120 - 200 (mg/dL)    Triglyceride 58  36 - 165 (mg/dL)    HDL Cholesterol 57  36 - 85 (mg/dL)    VLDL, calculated 12  5 - 40 (mg/dL)    LDL, calculated 73  0 - 160 (mg/dL)   HEMOGLOBIN Z6X       Component Value Range    Hemoglobin A1C 8.3 (*) 4.8 - 5.6 (%)   GLUCOSE, RANDOM       Component Value Range    Glucose 95  70 - 100 (mg/dL)   ALT       Component Value Range    ALT 17  10 - 40 (U/L)      Assessment: #1. Hypertension is very well controlled. He'll continue Hyzaar and amlodipine 2.5 mg daily. #2. Lipids are doing well. He will continue simvastatin 20 mg each evening and he is on niacin 1500 mg each evening as ordered by his cardiologist. #3. ASHD stable since bypass surgery. He'll continue healthy diet and carvedilol 12.5 mg b.i.d. And aspirin 81 mg daily. #4. Elevated PSA, that has been labile and will check PSA level now. #5. His fasting glucose was slightly elevated last visit now is 95 but his A1c is 8.3, consistent with diabetes. This was discussed at length with the patient he'll be continuing exercise and cutting out the sweets he has been eating except for special occasions we will recheck all of his lab in 6 months. He may need metformin.    followup 6 months for a complete evaluation

## 2010-08-04 LAB — AMB POC URINALYSIS DIP STICK AUTO W/O MICRO
Bilirubin (UA POC): NEGATIVE
Glucose (UA POC): NEGATIVE
Ketones (UA POC): NEGATIVE
Leukocyte esterase (UA POC): NEGATIVE
Nitrites (UA POC): NEGATIVE
Protein (UA POC): NEGATIVE mg/dL
Specific gravity (UA POC): 1.02 (ref 1.001–1.035)
Urobilinogen (UA POC): 0.2
pH (UA POC): 6 (ref 4.6–8.0)

## 2010-08-04 LAB — PSA, DIAGNOSTIC (PROSTATE SPECIFIC AG): Prostate Specific Ag: 2.8 ng/mL (ref 0.0–4.0)

## 2010-08-04 NOTE — Progress Notes (Signed)
Raymond [name] returns for followup of a variable PSA. He times his PSA goes above 4.0. He had one done yesterday which was 2.8. He has minimal BPH symptoms and is not currently on medications for his prostate. He has had no urinary retention, prostatitis, urinary tract infections, hematuria or other urinary complaints.    His urine is clear with no evidence of infection or hematuria    Past medical history, family history, review of systems, medications and allergies were reviewed and are unchanged.    I did not recheck his prostate today.    Impression: #1 elevated PSA #2 early BPH    Plan: Continue monitor PSA. Recheck in 6 months. Prostate exam at that time.

## 2010-08-05 LAB — HEMOGLOBIN A1C WITH EAG: Hemoglobin A1c: 5.9 % — ABNORMAL HIGH (ref 4.8–5.6)

## 2010-08-11 MED ORDER — OMEGA-3 ACID ETHYL ESTERS 1 GRAM CAP
1 gram | ORAL_CAPSULE | Freq: Every evening | ORAL | Status: DC
Start: 2010-08-11 — End: 2011-08-08

## 2010-08-11 MED ORDER — LOSARTAN-HYDROCHLOROTHIAZIDE 100 MG-12.5 MG TAB
ORAL_TABLET | Freq: Every day | ORAL | Status: DC
Start: 2010-08-11 — End: 2011-08-02

## 2010-08-11 NOTE — Telephone Encounter (Signed)
Last visit: 08/03/2010  RX  Class sent: normal

## 2010-08-17 MED ORDER — SIMVASTATIN 20 MG TAB
20 mg | ORAL_TABLET | Freq: Every evening | ORAL | Status: DC
Start: 2010-08-17 — End: 2011-02-13

## 2010-08-17 NOTE — Telephone Encounter (Signed)
Last visit:08/03/2010  RX  Class sent:  normal

## 2010-08-17 NOTE — Telephone Encounter (Signed)
Simvastatin 20mg  #30 refill requested  [facility]    [phone]

## 2010-09-19 MED ORDER — TRIAMCINOLONE ACETONIDE 55 MCG/ACTUATION NASAL SPRAY, AEROSOL
55 mcg/actuation | Freq: Every day | NASAL | Status: DC
Start: 2010-09-19 — End: 2012-03-05

## 2010-09-19 MED ORDER — TRIAMCINOLONE ACETONIDE 55 MCG NASAL SPRAY AEROSOL
55 mcg | NASAL | Status: DC
Start: 2010-09-19 — End: 2011-02-02

## 2010-09-19 NOTE — Telephone Encounter (Signed)
Rx routed to MD for approval  Last seen 08/03/10

## 2010-09-22 NOTE — Progress Notes (Signed)
HISTORY OF PRESENT ILLNESS  Raymond [name] is a 65 y.o. male.    HPI  Tiler has been doing well.  He has been active and denies chest pain, dyspnea, orthopnea or PND.  He denies palpitation.  He does have occasional mild dizziness but no syncope or sinking feeling.  He has had no symptoms to indicate TIA or amaurosis fugax.    He has a history of known coronary artery disease and had an acute inferior wall myocardial infarction on October 12, 2001, and had stenting of the right coronary artery at that time. In January 2006, he started experiencing frequent palpitations and dizziness with a feeling as if he was dropping down on a roller coaster. He was admitted to [facility] on February 27, 2004 with an episode of the sinking feeling and nonsustained ventricular tachycardia. He subsequently underwent cardiac catheterization followed by CABG X3 on March 03, 2004, which consisted of:   1. LIMA to the LAD.   2. Sequential SVG to the diagonal and obtuse marginal branch.   His preoperative EF was in the 45% range. He was treated with amiodarone for nonsustained ventricular tachycardia and has had no recurrence of the sinking feeling or palpitations since the bypass surgery. He developed a large right pleural effusion which was once drained, but he has had some reaccumulation. Because of the sinking feeling, he had a 24-hour Holter monitor which failed to reveal a significant cardiac arrhythmia other than PVCs. He did have the sinking feeling while he was wearing the monitor, but it did not coincide with any significant ventricular arrhythmia.   He had the follow up stress nuclear cardiac imaging on 02/17/2010 which demonstrated fixed defect involving the inferior base with no significant ischemia. Ejection fraction was estimated at 55%.      Review of Systems   Constitutional: Negative for weight loss and malaise/fatigue.   Respiratory: Negative for shortness of breath.     Cardiovascular: Negative for chest pain, palpitations, orthopnea, claudication, leg swelling and PND.   Neurological: Positive for dizziness. Negative for weakness.       Physical Exam   Constitutional: He is oriented to person, place, and time. He appears well-developed and well-nourished.   Eyes: Conjunctivae are normal. Pupils are equal, round, and reactive to light.   Neck: Normal range of motion. Neck supple. No JVD present.   Cardiovascular: Regular rhythm, S1 normal and S2 normal.   No extrasystoles are present. Bradycardia present.  PMI is not displaced.  Exam reveals no gallop and no friction rub.    No murmur heard.  Pulses:       Carotid pulses are 3+ on the right side, and 3+ on the left side.  Pulmonary/Chest: Effort normal. He has no rales.   Abdominal: Soft. There is no tenderness.   Musculoskeletal: He exhibits no edema.   Neurological: He is alert and oriented to person, place, and time.   Skin: Skin is warm and dry.     BP 125/70   Pulse 44   Ht 5\' 9"  (1.753 m)   Wt 83.462 kg (184 lb)   BMI 27.17 kg/m2   SpO2 94%    Past Medical History   Diagnosis Date   ??? Coronary artery disease      inferior wall myocardial infarction and stenting of the RCA 9/03; Status post CABG X3 in January 2006.   ??? Myocardial infarction, inferior wall 10/12/01     acute   ??? Hypertension    ???  Hypercholesteremia      low HDL   ??? Ventricular tachycardia, nonsustained    ??? 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 Pleural effusion on right    ??? Echocardiogram 11/08/05     LVH; inferior & inferolateral wall motion abnormality; trace MR; EF 45-50%   ??? S/P cardiac cath 02/29/2004     oRCA 100%.  LM patent.  p/mLAD 85%.  D1 90%.  D2 90%.  CX 45%.  LVEDP 12 mmHg.  EF 45%.  CABG recommended.   ??? Myocardial perfusion 02/22/2010     Fixed defect in inferior base c/w artifact vs prior inferobasal injury. LVE. EF 55%. MIld inferobasal HK noted.   ??? Hypertrophy of prostate with urinary obstruction and other lower urinary tract symptoms (LUTS)     ??? Elevated prostate specific antigen (PSA)    ??? Microscopic hematuria    ??? Hypertension    ??? Right renal cyst    ??? ASHD (arteriosclerotic heart disease)        Past Surgical History   Procedure Date   ??? Stent insertion 10/12/01     right coronary artery   ??? Hx heart catheterization 03/03/04     followed by CABG X3   ??? Hx coronary artery bypass graft 03/03/04     LIMA to the LAD. Sequential SVG to the diagonal and obtuse marginal branch   ??? Hx coronary stent placement 10/12/01     RCA stented using 3.5 x 13 mm Express 2 stent   ??? Hx tonsillectomy        Current Outpatient Prescriptions   Medication Sig Dispense Refill   ??? triamcinolone (NASACORT AQ) 55 mcg nasal inhaler INSTILL 2 SPRAYS INTO EACH NOSTRIL DAILY  17 g  10   ??? Triamcinolone Acetonide (NASACORT AQ) 55 mcg/actuation Aero nasal spray 2 Sprays by Both Nostrils route daily.  1 Container  11   ??? simvastatin (ZOCOR) 20 mg tablet Take 1 Tab by mouth nightly.  30 Tab  5   ??? losartan-hydrochlorothiazide (HYZAAR) 100-12.5 mg per tablet Take 1 Tab by mouth daily.  30 Tab  11   ??? omega-3 acid ethyl esters (LOVAZA) 1 gram capsule Take 1 Cap by mouth every evening.  30 Cap  11   ??? carvedilol (COREG) 12.5 mg tablet Take 1 Tab by mouth two (2) times a day.  60 Tab  11   ??? amLODIPine (NORVASC) 2.5 mg tablet Take 1 Tab by mouth daily.  30 Tab  6   ??? niacin ER (NIASPAN) 500 mg tablet Take 3 Tabs by mouth nightly.  270 Tab  4   ??? aspirin 81 mg tablet Take 81 mg by mouth daily.       ??? MULTIVITAMIN PO Take 1 Tab by mouth daily.           EKG: unchanged from previous tracings, sinus bradycardia, RBBB, inf.scar  .   ASSESSMENT and PLAN  Encounter Diagnoses   Name Primary?   ??? CAD, history of inferior wall myocardial infarction/status post stenting of the RCA in September 2003/status post CABG X3 in January 2006/EF 45%. Yes   ??? Nonsustained ventricular tachycardia    ??? Hypertension    ??? Hypercholesteremia    ??? PVC's (premature ventricular contractions)     He continues to do well.  He has had no symptoms to indicate angina.  He has had no significant dizziness or syncope.  He does have sinus bradycardia, most likely related to beta-blocker treatment  but has not had any symptoms to indicate severe bradycardia enough to warrant pacemaker insertion.  For now, he will be continued on current medical regimen.

## 2010-10-31 MED ORDER — AMLODIPINE 2.5 MG TAB
2.5 mg | ORAL_TABLET | Freq: Every day | ORAL | Status: DC
Start: 2010-10-31 — End: 2011-05-29

## 2011-01-26 LAB — LIPID PANEL
Cholesterol, total: 144 mg/dL (ref 120–200)
HDL Cholesterol: 58 mg/dL (ref 36–85)
LDL, calculated: 72 mg/dL (ref 0–160)
Triglyceride: 69 mg/dL (ref 36–165)
VLDL, calculated: 14 mg/dL (ref 5–40)

## 2011-01-26 LAB — CBC WITH AUTOMATED DIFF
ABS. BASOPHILS: 0 10*3/uL (ref 0.0–0.2)
ABS. EOSINOPHILS: 0.2 10*3/uL (ref 0.0–0.4)
ABS. MONOCYTES: 0.5 10*3/uL (ref 0.1–1.0)
ABS. NEUTROPHILS: 3.2 10*3/uL (ref 1.8–7.8)
Abs Lymphocytes: 1.5 10*3/uL (ref 0.7–4.5)
BASOPHILS: 0 % (ref 0–3)
EOSINOPHILS: 4 % (ref 0–7)
HCT: 41.1 % (ref 37.5–51.0)
HGB: 13.5 g/dL (ref 12.6–17.7)
Lymphocytes: 27 % (ref 14–46)
MCH: 32.1 pg (ref 26.6–33.0)
MCHC: 32.8 g/dL (ref 31.5–35.7)
MCV: 98 fL — ABNORMAL HIGH (ref 79–97)
MONOCYTES: 9 % (ref 4–13)
NEUTROPHILS: 59 % (ref 40–74)
PLATELET: 146 10*3/uL (ref 140–415)
RBC: 4.21 x10E6/uL (ref 4.14–5.80)
RDW: 13.1 % (ref 12.3–15.4)
WBC: 5.4 10*3/uL (ref 4.0–10.5)

## 2011-01-26 LAB — MICROSCOPIC EXAMINATION
Cast type: NEGATIVE
Casts: NEGATIVE /lpf
Crystal type: NEGATIVE
Crystals: NEGATIVE
Epithelial cells, renal: NEGATIVE /hpf
Mucus: NEGATIVE
Trichomonas: NEGATIVE
Yeast: NEGATIVE

## 2011-01-26 LAB — URINALYSIS W/ RFLX MICROSCOPIC
Bilirubin: NEGATIVE
Blood: NEGATIVE
Glucose: NEGATIVE
Ketone: NEGATIVE
Nitrites: NEGATIVE
Specific Gravity: 1.02 (ref 1.005–1.030)
Urobilinogen: 0.2 mg/dL (ref 0.0–1.9)
pH (UA): 7 (ref 5.0–7.5)

## 2011-01-26 LAB — METABOLIC PANEL, COMPREHENSIVE
A-G Ratio: 1.7 (ref 0.6–2.2)
ALT (SGPT): 6 U/L (ref 5–30)
AST (SGOT): 16 U/L (ref 9–44)
Albumin: 4 g/dL (ref 3.5–5.3)
Alk. phosphatase: 42 U/L — ABNORMAL LOW (ref 53–128)
BUN: 22 mg/dL (ref 7–25)
Bilirubin, total: 0.8 mg/dL (ref 0.2–1.3)
CO2: 29.8 mEq/L (ref 25.0–34.5)
Calcium: 9.1 mg/dL (ref 8.1–10.4)
Chloride: 104 mEq/L (ref 94–112)
Creatinine: 1.47 mg/dL — ABNORMAL HIGH (ref 0.40–1.40)
GFR est non-AA: 49 mL/min/{1.73_m2} — ABNORMAL LOW (ref >59)
GLOBULIN, TOTAL: 2.3 g/dL (ref 2.0–4.8)
Glucose: 103 mg/dL — ABNORMAL HIGH (ref 70–100)
Potassium: 3.7 mEq/L (ref 3.6–5.5)
Protein, total: 6.3 g/dL (ref 6.3–8.5)
Sodium: 142 mEq/L (ref 135–145)
eGFR If African American: 57 mL/min/{1.73_m2} — ABNORMAL LOW (ref >59)

## 2011-01-27 LAB — AMB POC URINALYSIS DIP STICK AUTO W/O MICRO
Bilirubin (UA POC): NEGATIVE
Glucose (UA POC): NEGATIVE
Ketones (UA POC): NEGATIVE
Leukocyte esterase (UA POC): NEGATIVE
Nitrites (UA POC): NEGATIVE
Protein (UA POC): NEGATIVE mg/dL
Specific gravity (UA POC): 1.025 (ref 1.001–1.035)
Urobilinogen (UA POC): 0.2 (ref 0.2–1)
pH (UA POC): 6 (ref 4.6–8.0)

## 2011-01-27 LAB — HEMOGLOBIN A1C WITH EAG: Hemoglobin A1c: 5.6 % (ref 4.8–5.6)

## 2011-01-27 LAB — PSA, DIAGNOSTIC (PROSTATE SPECIFIC AG): Prostate Specific Ag: 3 ng/mL (ref 0.0–4.0)

## 2011-01-27 NOTE — Progress Notes (Signed)
Raymond [name] returns for followup both a long history of minimal microscopic hematuria and a variable PSA. Over the past couple of years he is had a normal PSA. He is on no medications for prostate. He said no gross hematuria or prostatitis.    His urine today is trace positive for hemoglobin and has a rare RBC    Prostate is estimated 15-20 g, smooth symmetrical without nodules. There are no rectal masses    Impression: #1 history of elevated PSA #chronic microscopic hematuria    Plan: Check PSA in 6 months.

## 2011-02-02 NOTE — Progress Notes (Signed)
Raymond [name], Raymond [name] 02-Dec-1945, is a 65 y.o. male, who is seen today for.i routine physical exam and followup of his atherosclerotic heart disease, slightly elevated glucose, hyperlipidemia, no UTIs and elevated BPH along with hypertension. He is taking his medicines correctly and thinks his blood pressure is doing well. He checks it occasionally. He is having no chest discomfort with exertion and remains quite active. End stent placed in 2003 and bypass surgery in 2006 after an initial inferior infarction in 2003. He is breathing well passing his urine well and no other complaints. The cold about 10 days ago which is subsiding. Said some right shoulder discomfort off and on for several weeks at least with certain movements. He is active but doesn't exercise. Does his work.    Past Medical History   Diagnosis Date   ??? Coronary artery disease      inferior wall myocardial infarction and stenting of the RCA 9/03; Status post CABG X3 in January 2006.   ??? Myocardial infarction, inferior wall 10/12/01     acute   ??? Hypertension    ??? Hypercholesteremia      low HDL   ??? Ventricular tachycardia, nonsustained    ??? 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 Pleural effusion on right    ??? Echocardiogram 11/08/05     LVH; inferior & inferolateral wall motion abnormality; trace MR; EF 45-50%   ??? S/P cardiac cath 02/29/2004     oRCA 100%.  LM patent.  p/mLAD 85%.  D1 90%.  D2 90%.  CX 45%.  LVEDP 12 mmHg.  EF 45%.  CABG recommended.   ??? Myocardial perfusion 02/22/2010     Fixed defect in inferior base c/w artifact vs prior inferobasal injury. LVE. EF 55%. MIld inferobasal HK noted.   ??? Hypertrophy of prostate with urinary obstruction and other lower urinary tract symptoms (LUTS)    ??? Elevated prostate specific antigen (PSA)    ??? Microscopic hematuria    ??? Hypertension    ??? Right renal cyst    ??? ASHD (arteriosclerotic heart disease)      Current Outpatient Prescriptions   Medication Sig Dispense Refill    ??? amLODIPine (NORVASC) 2.5 mg tablet Take 1 Tab by mouth daily.  30 Tab  6   ??? Triamcinolone Acetonide (NASACORT AQ) 55 mcg/actuation Aero nasal spray 2 Sprays by Both Nostrils route daily.  1 Container  11   ??? simvastatin (ZOCOR) 20 mg tablet Take 1 Tab by mouth nightly.  30 Tab  5   ??? losartan-hydrochlorothiazide (HYZAAR) 100-12.5 mg per tablet Take 1 Tab by mouth daily.  30 Tab  11   ??? omega-3 acid ethyl esters (LOVAZA) 1 gram capsule Take 1 Cap by mouth every evening.  30 Cap  11   ??? carvedilol (COREG) 12.5 mg tablet Take 1 Tab by mouth two (2) times a day.  60 Tab  11   ??? niacin ER (NIASPAN) 500 mg tablet Take 3 Tabs by mouth nightly.  270 Tab  4   ??? aspirin 81 mg tablet Take 81 mg by mouth daily.       ??? MULTIVITAMIN PO Take 1 Tab by mouth daily.         He doesn't drink alcohol or smoke cigarettes. Doesn't use drugs. He is married. Grandchildren.    Review of systems on the above is negative.    Visit Vitals   Item Reading   ??? BP 118/76   ??? Pulse 60   ???  Temp(Src) 98.5 ??F (36.9 ??C) (Oral)   ??? Resp 14   ??? Ht 5\' 9"  (1.753 m)   ??? Wt 183 lb (83.008 kg)   ??? BMI 27.02 kg/m2     Oral cavity reveals no lesions. Ear canals appear normal. Neck reveals no adenopathy or thyromegaly. Carotids are 2+ without bruits. Lungs are clear to percussion. Good breath sounds with no wheezing rales or rhonchi. Heart reveals a regular rhythm with normal muscle S2 no murmur gallop click or rub. Apical impulse is nonpalpable. Has a well-healed median sternotomy scar. Abdomen is soft and nontender with no hepatosplenomegaly or masses no bruits. Extremities reveal no edema. Pulses are 2+. Extremities reveal no clubbing cyanosis or edema. Pulses are 2+. Rectal exam is normal, prostate is red nodules. No evidence of hernia.    Results for orders placed in visit on 01/27/11   AMB POC URINALYSIS DIP STICK AUTO W/O MICRO       Component Value Range    Color Yellow  (none)    Clarity Clear  (none)    Glucose Negative  (none)     Bilirubin Negative  (none)    Ketones Negative  (none)    Spec.Grav. 1.025  1.001 - 1.035    Blood Trace  (none)    pH 6.0  4.6 - 8.0    Protein neg  Negative mg/dL    Urobilinogen 0.2 mg/dL  0.2 - 1    Nitrites Negative  (none)    Leukocyte esterase Negative  (none)      assessment:#1. Hyperlipidemia is doing well. He'll continue simvastatin 20 mg each evening and Niaspan 1500 mg daily as prescribed by his cardiologist. #2. CS is doing well. He will continue Flonase and Proscar. #3. ASHD very stable with no symptoms since 2006 bypass surgery. #4. #4. Review elevation of glucose now at 103 with A1c normal at 5.6. #5. Recent head cold resolving normal exam.    He would get a flu shot now, continue with current medical regimen and followup will be in 6 months with lab.    Everything else is up-to-date. He'll continue walking for exercise once avoid weight gain.

## 2011-02-02 NOTE — Patient Instructions (Signed)
MyChart Activation    Thank you for requesting access to MyChart. Please follow the instructions below to securely access and download your online medical record. MyChart allows you to send messages to your doctor, view your test results, renew your prescriptions, schedule appointments, and more.    How Do I Sign Up?    1. In your internet browser, go to [email or web address]  2. Click on the First Time User? Click Here link in the Sign In box. You will be redirect to the New Member Sign Up page.  3. Enter your MyChart Access Code exactly as it appears below. You will not need to use this code after you???ve completed the sign-up process. If you do not sign up before the expiration date, you must request a new code.    MyChart Access Code: Not generated  Current MyChart Status: Active (This is the date your MyChart access code will expire)    4. Enter the last four digits of your Social Security Number (xxxx) and Date of Birth (mm/dd/yyyy) as indicated and click Submit. You will be taken to the next sign-up page.  5. Create a MyChart ID. This will be your MyChart login ID and cannot be changed, so think of one that is secure and easy to remember.  6. Create a MyChart password. You can change your password at any time.  7. Enter your Password Reset Question and Answer. This can be used at a later time if you forget your password.   8. Enter your e-mail address. You will receive e-mail notification when new information is available in MyChart.  9. Click Sign Up. You can now view and download portions of your medical record.  10. Click the Download Summary menu link to download a portable copy of your medical information.    Additional Information    If you have questions, please visit the Frequently Asked Questions section of the MyChart website at [email or web address] Remember, MyChart is NOT to be used for urgent needs. For medical emergencies, dial 911.

## 2011-02-13 MED ORDER — SIMVASTATIN 20 MG TAB
20 mg | ORAL_TABLET | Freq: Every evening | ORAL | Status: DC
Start: 2011-02-13 — End: 2011-08-14

## 2011-02-13 NOTE — Telephone Encounter (Signed)
From: Traum,Adron F   To: [name], MD   Sent: Wynelle Link Feb 12, 2011 7:57 PM   Subject: Medication Renewal Request    Original authorizing provider: Greggory Brandy. Jill Poling, MD    [name] would like a refill of the following medications:  simvastatin (ZOCOR) 20 mg tablet [Roderick [name], MD]    Preferred pharmacy: [facility]    Comment:

## 2011-02-20 MED ORDER — NIACIN ER 500 MG TAB (NIASPAN)
500 mg | ORAL_TABLET | Freq: Every evening | ORAL | Status: DC
Start: 2011-02-20 — End: 2012-05-13

## 2011-02-20 NOTE — Telephone Encounter (Signed)
Last visit: 02/02/2011  RX  Class sent:normal

## 2011-02-20 NOTE — Telephone Encounter (Signed)
From: Nembhard,Gradie F   To: [name], MD   Sent: Mon Feb 20, 2011 4:46 PM   Subject: Medication Renewal Request    Original authorizing provider: Greggory Brandy. Jill Poling, MD    [name] would like a refill of the following medications:  niacin ER (NIASPAN) 500 mg tablet [Roderick [name], MD]    Preferred pharmacy: [facility]    Comment:

## 2011-03-30 NOTE — Progress Notes (Signed)
HISTORY OF PRESENT ILLNESS  Raymond [name] is a 66 y.o. male.    HPI  He has been doing well.  He offers no cardiac complaints. He denies chest pain, dyspnea, orthopnea or PND.  He denies palpitations, dizziness or syncope. He has had no symptoms of sinking feeling that he used to have.  He denies any symptoms to indicate TIA or amaurosis fugax. His activity level seems to have been adequate.                                          He has a history of known coronary artery disease and had an acute inferior wall myocardial infarction on October 12, 2001, and had stenting of the right coronary artery at that time. In January 2006, he started experiencing frequent palpitations and dizziness with a feeling as if he was dropping down on a roller coaster. He was admitted to [facility] on February 27, 2004 with an episode of the sinking feeling and nonsustained ventricular tachycardia. He subsequently underwent cardiac catheterization followed by CABG X3 on March 03, 2004, which consisted of:   1. LIMA to the LAD.   2. Sequential SVG to the diagonal and obtuse marginal branch.   His preoperative EF was in the 45% range. He was treated with amiodarone for nonsustained ventricular tachycardia and has had no recurrence of the sinking feeling or palpitations since the bypass surgery. He developed a large right pleural effusion which was once drained, but he has had some reaccumulation. Because of the sinking feeling, he had a 24-hour Holter monitor which failed to reveal a significant cardiac arrhythmia other than PVCs. He did have the sinking feeling while he was wearing the monitor, but it did not coincide with any significant ventricular arrhythmia.   He had the follow up stress nuclear cardiac imaging on 02/17/2010 which demonstrated fixed defect involving the inferior base with no significant ischemia. Ejection fraction was estimated at 55%.      Review of Systems   Constitutional: Negative for weight loss  and malaise/fatigue.   Respiratory: Negative for shortness of breath.    Cardiovascular: Negative for chest pain, palpitations, orthopnea, claudication, leg swelling and PND.   Neurological: Negative for dizziness and weakness.       Physical Exam   Constitutional: He is oriented to person, place, and time. He appears well-developed and well-nourished.   Eyes: Conjunctivae are normal. Pupils are equal, round, and reactive to light.   Neck: Normal range of motion. Neck supple. No JVD present.   Cardiovascular: Regular rhythm, S1 normal and S2 normal.   No extrasystoles are present. Bradycardia present.  PMI is not displaced.  Exam reveals no gallop and no friction rub.    No murmur heard.  Pulses:       Carotid pulses are 3+ on the right side, and 3+ on the left side.  Pulmonary/Chest: Effort normal. He has no rales.   Abdominal: Soft. There is no tenderness.   Musculoskeletal: He exhibits no edema.   Neurological: He is alert and oriented to person, place, and time.   Skin: Skin is warm and dry.     BP 128/70   Pulse 44   Ht 5\' 9"  (1.753 m)   Wt 85.276 kg (188 lb)   BMI 27.76 kg/m2   SpO2 97%    Past Medical History   Diagnosis  Date   ??? Coronary artery disease      inferior wall myocardial infarction and stenting of the RCA 9/03; Status post CABG X3 in January 2006.   ??? Myocardial infarction, inferior wall 10/12/01     acute   ??? Hypertension    ??? Hypercholesteremia      low HDL   ??? Ventricular tachycardia, nonsustained    ??? 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 Pleural effusion on right    ??? Echocardiogram 11/08/2005     LVE.  EF 45-50%.  Inferior, inferolateral WMA.     ??? S/P cardiac cath 02/29/2004     oRCA 100%.  LM patent.  p/mLAD 85%.  D1 90%.  D2 90%.  CX 45%.  LVEDP 12 mmHg.  EF 45%.  CABG recommended.   ??? Myocardial perfusion 02/22/2010     Fixed defect in inferior base c/w artifact vs prior inferobasal injury. LVE. EF 55%. MIld inferobasal HK noted.   ??? Hypertrophy of prostate with urinary obstruction and other lower  urinary tract symptoms (LUTS)    ??? Elevated prostate specific antigen (PSA)    ??? Microscopic hematuria    ??? Hypertension    ??? Right renal cyst    ??? ASHD (arteriosclerotic heart disease)        Past Surgical History   Procedure Date   ??? Stent insertion 10/12/01     right coronary artery   ??? Hx heart catheterization 03/03/04     followed by CABG X3   ??? Hx coronary artery bypass graft 03/03/04     LIMA to the LAD. Sequential SVG to the diagonal and obtuse marginal branch   ??? Hx coronary stent placement 10/12/01     RCA stented using 3.5 x 13 mm Express 2 stent   ??? Hx tonsillectomy        Current Outpatient Prescriptions   Medication Sig Dispense Refill   ??? niacin ER (NIASPAN) 500 mg tablet Take 3 Tabs by mouth nightly.  270 Tab  4   ??? simvastatin (ZOCOR) 20 mg tablet Take 1 Tab by mouth nightly.  30 Tab  5   ??? amLODIPine (NORVASC) 2.5 mg tablet Take 1 Tab by mouth daily.  30 Tab  6   ??? Triamcinolone Acetonide (NASACORT AQ) 55 mcg/actuation Aero nasal spray 2 Sprays by Both Nostrils route daily.  1 Container  11   ??? losartan-hydrochlorothiazide (HYZAAR) 100-12.5 mg per tablet Take 1 Tab by mouth daily.  30 Tab  11   ??? omega-3 acid ethyl esters (LOVAZA) 1 gram capsule Take 1 Cap by mouth every evening.  30 Cap  11   ??? carvedilol (COREG) 12.5 mg tablet Take 1 Tab by mouth two (2) times a day.  60 Tab  11   ??? aspirin 81 mg tablet Take 81 mg by mouth daily.       ??? MULTIVITAMIN PO Take 1 Tab by mouth daily.           EKG: unchanged from previous tracings, sinus bradycardia, RBBB, inf.scar  .                                       ASSESSMENT and PLAN  Encounter Diagnoses   Name Primary?   ??? CAD, history of inferior wall myocardial infarction/status post stenting of the RCA in September 2003/status post CABG X3 in January 2006/EF 45%. Yes   ???  Nonsustained ventricular tachycardia    ??? Hypercholesteremia    ??? Hypertension    He continues to do well. He has had no symptoms to indicate angina.  He has had no symptoms to indicate  tachyarrhythmia or bradycardia related symptoms.  His activity level seems to be satisfactory. His blood pressure has been adequate.  For now, he will be continued on the current medical regimen.

## 2011-05-29 MED ORDER — AMLODIPINE 2.5 MG TAB
2.5 mg | ORAL_TABLET | Freq: Every day | ORAL | Status: DC
Start: 2011-05-29 — End: 2011-12-25

## 2011-05-29 NOTE — Telephone Encounter (Signed)
From: Schiano,Kaladin F   To: Bennie Hind, MD   Sent: Mon May 29, 2011 1:39 PM   Subject: Medication Renewal Request    Original authorizing provider: Bennie Hind, MD    [name] would like a refill of the following medications:  amLODIPine (NORVASC) 2.5 mg tablet Bennie Hind, MD]    Preferred pharmacy: [facility]    Comment:

## 2011-05-29 NOTE — Telephone Encounter (Signed)
Last visit: 02/02/2011  RX  Class sent: normal

## 2011-06-20 MED ORDER — POLYETHYLENE GLYCOL 3350 100 % ORAL POWDER
17 gram/dose | ORAL | Status: DC
Start: 2011-06-20 — End: 2011-09-01

## 2011-06-20 MED ORDER — CARVEDILOL 12.5 MG TAB
12.5 mg | ORAL_TABLET | Freq: Two times a day (BID) | ORAL | Status: DC
Start: 2011-06-20 — End: 2012-01-01

## 2011-06-20 NOTE — Progress Notes (Signed)
Raymond [name], gomes April 12, 1945, is a 66 y.o. male, who is seen today for Evaluation of a one-week history of intermittent constipation. Some days it has actually loose stools but most of the time with constipated stools. He is having to strain somewhat and is trying to avoid laxatives. No blood in the stools or pain or fever or chills. No distention.    Past Medical History   Diagnosis Date   ??? Coronary artery disease      inferior wall myocardial infarction and stenting of the RCA 9/03; Status post CABG X3 in January 2006.   ??? Myocardial infarction, inferior wall 10/12/01     acute   ??? Hypertension    ??? Hypercholesteremia      low HDL   ??? Ventricular tachycardia, nonsustained    ??? 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 Pleural effusion on right    ??? Echocardiogram 11/08/2005     LVE.  EF 45-50%.  Inferior, inferolateral WMA.     ??? S/P cardiac cath 02/29/2004     oRCA 100%.  LM patent.  p/mLAD 85%.  D1 90%.  D2 90%.  CX 45%.  LVEDP 12 mmHg.  EF 45%.  CABG recommended.   ??? Myocardial perfusion 02/22/2010     Fixed defect in inferior base c/w artifact vs prior inferobasal injury. LVE. EF 55%. MIld inferobasal HK noted.   ??? Hypertrophy of prostate with urinary obstruction and other lower urinary tract symptoms (LUTS)    ??? Elevated prostate specific antigen (PSA)    ??? Microscopic hematuria    ??? Hypertension    ??? Right renal cyst    ??? ASHD (arteriosclerotic heart disease)      Current Outpatient Prescriptions   Medication Sig Dispense Refill   ??? carvedilol (COREG) 12.5 mg tablet Take 1 Tab by mouth two (2) times a day.  90 Tab  3   ??? polyethylene glycol (MIRALAX) 17 gram/dose powder One capful in liquid daily or as directed  537 g  10   ??? amLODIPine (NORVASC) 2.5 mg tablet Take 1 Tab by mouth daily.  30 Tab  6   ??? niacin ER (NIASPAN) 500 mg tablet Take 3 Tabs by mouth nightly.  270 Tab  4   ??? simvastatin (ZOCOR) 20 mg tablet Take 1 Tab by mouth nightly.  30 Tab  5   ??? Triamcinolone Acetonide (NASACORT AQ) 55 mcg/actuation Aero nasal spray  2 Sprays by Both Nostrils route daily.  1 Container  11   ??? losartan-hydrochlorothiazide (HYZAAR) 100-12.5 mg per tablet Take 1 Tab by mouth daily.  30 Tab  11   ??? omega-3 acid ethyl esters (LOVAZA) 1 gram capsule Take 1 Cap by mouth every evening.  30 Cap  11   ??? aspirin 81 mg tablet Take 81 mg by mouth daily.       ??? MULTIVITAMIN PO Take 1 Tab by mouth daily.         Past Surgical History   Procedure Date   ??? Stent insertion 10/12/01     right coronary artery   ??? Hx heart catheterization 03/03/04     followed by CABG X3   ??? Hx coronary artery bypass graft 03/03/04     LIMA to the LAD. Sequential SVG to the diagonal and obtuse marginal branch   ??? Hx coronary stent placement 10/12/01     RCA stented using 3.5 x 13 mm Express 2 stent   ??? Hx tonsillectomy  Visit Vitals   Item Reading   ??? BP 122/64   ??? Pulse 61   ??? Temp(Src) 98.2 ??F (36.8 ??C) (Oral)   ??? Resp 17   ??? Ht 5\' 9"  (1.753 m)   ??? Wt 178 lb (80.74 kg)   ??? BMI 26.29 kg/m2   ??? SpO2 98%     Abdomen is not distended no hepatosplenomegaly or masses no bruits. No tympany. Bowel sounds reveal no high pitched tinkling sounds and normoactive bowel sounds. No point tenderness.    Assessment: Constipation with some vague abdominal fullness. No evidence of blockage or anything more serious. Will treat with polyethylene glycol proctoscopy about how to adjust dose week by week.    Followup as previously scheduled

## 2011-06-20 NOTE — Telephone Encounter (Signed)
Called pharmacy and gave information for medication miralax script. Pharmacist received information well.

## 2011-06-20 NOTE — Telephone Encounter (Signed)
PT has some stomach or bowel irritability. He is going out of the country next week,. Not diarrhea, not totally constipated but not working properly.  Pt wants appt today. PT# [id number] or  [phone]

## 2011-06-20 NOTE — Patient Instructions (Signed)
MyChart Activation    Thank you for requesting access to MyChart. Please follow the instructions below to securely access and download your online medical record. MyChart allows you to send messages to your doctor, view your test results, renew your prescriptions, schedule appointments, and more.    How Do I Sign Up?    1. In your internet browser, go to [email or web address]  2. Click on the First Time User? Click Here link in the Sign In box. You will be redirect to the New Member Sign Up page.  3. Enter your MyChart Access Code exactly as it appears below. You will not need to use this code after you???ve completed the sign-up process. If you do not sign up before the expiration date, you must request a new code.    MyChart Access Code: Not generated  Current MyChart Status: Active (This is the date your MyChart access code will expire)    4. Enter the last four digits of your Social Security Number (xxxx) and Date of Birth (mm/dd/yyyy) as indicated and click Submit. You will be taken to the next sign-up page.  5. Create a MyChart ID. This will be your MyChart login ID and cannot be changed, so think of one that is secure and easy to remember.  6. Create a MyChart password. You can change your password at any time.  7. Enter your Password Reset Question and Answer. This can be used at a later time if you forget your password.   8. Enter your e-mail address. You will receive e-mail notification when new information is available in MyChart.  9. Click Sign Up. You can now view and download portions of your medical record.  10. Click the Download Summary menu link to download a portable copy of your medical information.    Additional Information    If you have questions, please visit the Frequently Asked Questions section of the MyChart website at [email or web address] Remember, MyChart is NOT to be used for urgent needs. For medical emergencies, dial 911.

## 2011-06-20 NOTE — Telephone Encounter (Signed)
appt scheduled

## 2011-08-02 MED ORDER — LOSARTAN-HYDROCHLOROTHIAZIDE 100 MG-12.5 MG TAB
ORAL_TABLET | Freq: Every day | ORAL | Status: DC
Start: 2011-08-02 — End: 2012-07-29

## 2011-08-02 NOTE — Telephone Encounter (Signed)
Last visit: 06/20/2011  RX  Class sent: normal

## 2011-08-02 NOTE — Telephone Encounter (Signed)
From: Linsley,Johannes F   To: Bennie Hind, MD   Sent: Wed Aug 02, 2011 8:58 AM   Subject: Medication Renewal Request    Original authorizing provider: Bennie Hind, MD    Raymond [name] would like a refill of the following medications:  losartan-hydrochlorothiazide (HYZAAR) 100-12.5 mg per tablet Bennie Hind, MD]    Preferred pharmacy: [facility]    Comment:

## 2011-08-08 MED ORDER — OMEGA-3 ACID ETHYL ESTERS 1 GRAM CAP
1 gram | ORAL_CAPSULE | Freq: Every evening | ORAL | Status: DC
Start: 2011-08-08 — End: 2012-08-05

## 2011-08-08 NOTE — Telephone Encounter (Signed)
Last visit: 06/20/2011  RX  Class sent: normal

## 2011-08-08 NOTE — Telephone Encounter (Signed)
From: Shimko,Rhyland F   To: Bennie Hind, MD   Sent: Tue Aug 08, 2011 8:40 AM   Subject: Medication Renewal Request    Original authorizing provider: Bennie Hind, MD    [name] would like a refill of the following medications:  omega-3 acid ethyl esters (LOVAZA) 1 gram capsule Bennie Hind, MD]    Preferred pharmacy: [facility]    Comment:

## 2011-08-09 ENCOUNTER — Encounter

## 2011-08-14 LAB — LIPID PANEL
Cholesterol, total: 144 mg/dL (ref 120–200)
HDL Cholesterol: 60 mg/dL (ref 36–85)
LDL, calculated: 74 mg/dL (ref 0–160)
Triglyceride: 51 mg/dL (ref 36–165)
VLDL, calculated: 10 mg/dL (ref 5–40)

## 2011-08-14 LAB — METABOLIC PANEL, COMPREHENSIVE
A-G Ratio: 1.9 (ref 0.6–2.2)
ALT (SGPT): <5 U/L — ABNORMAL LOW (ref 5–30)
AST (SGOT): 11 U/L (ref 7–31)
Albumin: 4.1 g/dL (ref 3.5–5.3)
Alk. phosphatase: 48 U/L — ABNORMAL LOW (ref 53–128)
BUN/Creatinine ratio: 16
BUN: 18 mg/dL (ref 7–25)
Bilirubin, total: 0.9 mg/dL (ref 0.2–1.3)
CO2: 28.4 mEq/L (ref 25.0–34.5)
Calcium: 9.4 mg/dL (ref 8.1–10.4)
Chloride: 105 mEq/L (ref 94–112)
Creatinine: 1.14 mg/dL (ref 0.40–1.40)
GFR est non-AA: 66 mL/min/{1.73_m2} (ref >59)
GLOBULIN, TOTAL: 2.2 g/dL (ref 2.0–4.8)
Glucose: 105 mg/dL — ABNORMAL HIGH (ref 70–100)
Potassium: 3.5 mEq/L — ABNORMAL LOW (ref 3.6–5.5)
Protein, total: 6.3 g/dL (ref 6.3–8.5)
Sodium: 142 mEq/L (ref 135–145)
eGFR If African American: 77 mL/min/{1.73_m2} (ref >59)

## 2011-08-14 MED ORDER — SIMVASTATIN 20 MG TAB
20 mg | ORAL_TABLET | Freq: Every evening | ORAL | Status: DC
Start: 2011-08-14 — End: 2012-02-12

## 2011-08-14 NOTE — Telephone Encounter (Signed)
Last visit: 06/20/2011  RX  Class sent: normal

## 2011-08-14 NOTE — Telephone Encounter (Signed)
From: Stepp,Rollin F   To: [name], MD   Sent: Wynelle Link Aug 13, 2011 7:21 PM   Subject: Medication Renewal Request    Original authorizing provider: Greggory Brandy. Jill Poling, MD    Raymond [name] would like a refill of the following medications:  simvastatin (ZOCOR) 20 mg tablet [Roderick [name], MD]    Preferred pharmacy: [facility]    Comment:

## 2011-08-15 LAB — PSA, DIAGNOSTIC (PROSTATE SPECIFIC AG): Prostate Specific Ag: 3.3 ng/mL (ref 0.0–4.0)

## 2011-08-15 LAB — HEMOGLOBIN A1C WITH EAG: Hemoglobin A1c: 5.9 % — ABNORMAL HIGH (ref 4.8–5.6)

## 2011-09-01 LAB — AMB POC URINALYSIS DIP STICK AUTO W/O MICRO
Bilirubin (UA POC): NEGATIVE
Glucose (UA POC): NEGATIVE
Ketones (UA POC): NEGATIVE
Leukocyte esterase (UA POC): NEGATIVE
Nitrites (UA POC): NEGATIVE
Protein (UA POC): NEGATIVE mg/dL
Specific gravity (UA POC): 1.025 (ref 1.001–1.035)
Urobilinogen (UA POC): 0.2 (ref 0.2–1)
pH (UA POC): 6.5 (ref 4.6–8.0)

## 2011-09-01 NOTE — Progress Notes (Signed)
Raymond [name], Raymond [name] [birth year], is a 66 y.o. male, who is seen today for Evaluation of prediabetes and his recurrent disease, hyperlipidemia, hypertension and elevated PSA. He saw Dr. Thomasena Edis recently and his PSA is quite stable at 3.3 recently. He just got back from 3 weeks in [place] and enjoy himself. He is having no chest pain or shortness of breath and states are active.    Past Medical History   Diagnosis Date   ??? Coronary artery disease      inferior wall myocardial infarction and stenting of the RCA 9/03; Status post CABG X3 in January 2006.   ??? Myocardial infarction, inferior wall 10/12/01     acute   ??? Hypertension    ??? Hypercholesteremia      low HDL   ??? Ventricular tachycardia, nonsustained    ??? 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 Pleural effusion on right    ??? Echocardiogram 11/08/2005     LVE.  EF 45-50%.  Inferior, inferolateral WMA.     ??? S/P cardiac cath 02/29/2004     oRCA 100%.  LM patent.  p/mLAD 85%.  D1 90%.  D2 90%.  CX 45%.  LVEDP 12 mmHg.  EF 45%.  CABG recommended.   ??? Myocardial perfusion 02/22/2010     Fixed defect in inferior base c/w artifact vs prior inferobasal injury. LVE. EF 55%. MIld inferobasal HK noted.   ??? Hypertrophy of prostate with urinary obstruction and other lower urinary tract symptoms (LUTS)    ??? Elevated prostate specific antigen (PSA)    ??? Microscopic hematuria    ??? Hypertension    ??? Right renal cyst    ??? ASHD (arteriosclerotic heart disease)      Current Outpatient Prescriptions   Medication Sig Dispense Refill   ??? simvastatin (ZOCOR) 20 mg tablet Take 1 Tab by mouth nightly.  30 Tab  5   ??? omega-3 acid ethyl esters (LOVAZA) 1 gram capsule Take 1 Cap by mouth every evening.  30 Cap  11   ??? losartan-hydrochlorothiazide (HYZAAR) 100-12.5 mg per tablet Take 1 Tab by mouth daily.  30 Tab  11   ??? carvedilol (COREG) 12.5 mg tablet Take 1 Tab by mouth two (2) times a day.  90 Tab  3   ??? amLODIPine (NORVASC) 2.5 mg tablet Take 1 Tab by mouth daily.  30 Tab  6   ??? niacin ER (NIASPAN) 500 mg  tablet Take 3 Tabs by mouth nightly.  270 Tab  4   ??? Triamcinolone Acetonide (NASACORT AQ) 55 mcg/actuation Aero nasal spray 2 Sprays by Both Nostrils route daily.  1 Container  11   ??? aspirin 81 mg tablet Take 81 mg by mouth daily.       ??? MULTIVITAMIN PO Take 1 Tab by mouth daily.         Visit Vitals   Item Reading   ??? BP 120/66   ??? Pulse 50   ??? Temp(Src) 97.8 ??F (36.6 ??C) (Oral)   ??? Resp 14   ??? Ht 5\' 9"  (1.753 m)   ??? Wt 184 lb 12 oz (83.802 kg)   ??? BMI 27.28 kg/m2   ??? SpO2 99%     Carotids are 2 no bruits. No adenopathy or thyromegaly. Lungs are clear to percussion. Breath sounds with no wheezing rales or rhonchi. Heart reveals a regular rhythm with normal S1 and S2 no murmur gallop click or rub. Apical impulse is nonpalpable. Abdomen is soft and  nontender with no hepatosplenomegaly or masses. Extremities reveal no clubbing or cyanosis. There is trace edema in the lower legs and ankles. Pulses are 2+ throughout.    Results for orders placed in visit on 09/01/11   AMB POC URINALYSIS DIP STICK AUTO W/O MICRO       Component Value Range    Color Yellow  (none)    Clarity Clear  (none)    Glucose Negative  (none)    Bilirubin Negative  (none)    Ketones Negative  (none)    Spec.Grav. 1.025  1.001 - 1.035    Blood 1+  (none)    pH 6.5  4.6 - 8.0    Protein, Urine Negative  Negative mg/dL    Urobilinogen 0.2 mg/dL  0.2 - 1    Nitrites Negative  (none)    Leukocyte esterase Negative  (none)     Cholesterol is 144 and LDL 74. Glucose is 105 with hemoglobin A1c 5.9. PSA 3.3.    Assessment: One. Hyperlipidemia doing well. We will continue simvastatin 20 mg each evening and his cardiologist has him on niacin 1500 mg each evening and Lovaza 1 g daily. #2. ASHD asymptomatic. Is done well since bypass surgery. #3. Hypertension is well controlled. We will continue amlodipine 2.5 mg daily and Hyzaar 100/12.5 one daily. #4. Elevated PSA. This seems to be stable at this point. Repeat PSA in 6 months and he'll be seeing his  urologist in 6 months.    Followup in 6 months for physical

## 2011-09-01 NOTE — Progress Notes (Signed)
Chief complaint elevated PSA    Raymond [name] Returns for followup of elevated PSA. No sodas a history of microscopic hematuria. He's not had a prostate biopsy. His PSA has returned to normal. He has no voiding complaints. No history of gross hematuria or dysuria frequency. He does have nocturia times one which is no change.    Current Outpatient Prescriptions   Medication Sig Dispense Refill   ??? simvastatin (ZOCOR) 20 mg tablet Take 1 Tab by mouth nightly.  30 Tab  5   ??? omega-3 acid ethyl esters (LOVAZA) 1 gram capsule Take 1 Cap by mouth every evening.  30 Cap  11   ??? losartan-hydrochlorothiazide (HYZAAR) 100-12.5 mg per tablet Take 1 Tab by mouth daily.  30 Tab  11   ??? carvedilol (COREG) 12.5 mg tablet Take 1 Tab by mouth two (2) times a day.  90 Tab  3   ??? amLODIPine (NORVASC) 2.5 mg tablet Take 1 Tab by mouth daily.  30 Tab  6   ??? niacin ER (NIASPAN) 500 mg tablet Take 3 Tabs by mouth nightly.  270 Tab  4   ??? Triamcinolone Acetonide (NASACORT AQ) 55 mcg/actuation Aero nasal spray 2 Sprays by Both Nostrils route daily.  1 Container  11   ??? aspirin 81 mg tablet Take 81 mg by mouth daily.       ??? MULTIVITAMIN PO Take 1 Tab by mouth daily.         Past Medical History   Diagnosis Date   ??? Coronary artery disease      inferior wall myocardial infarction and stenting of the RCA 9/03; Status post CABG X3 in January 2006.   ??? Myocardial infarction, inferior wall 10/12/01     acute   ??? Hypertension    ??? Hypercholesteremia      low HDL   ??? Ventricular tachycardia, nonsustained    ??? 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 Pleural effusion on right    ??? Echocardiogram 11/08/2005     LVE.  EF 45-50%.  Inferior, inferolateral WMA.     ??? S/P cardiac cath 02/29/2004     oRCA 100%.  LM patent.  p/mLAD 85%.  D1 90%.  D2 90%.  CX 45%.  LVEDP 12 mmHg.  EF 45%.  CABG recommended.   ??? Myocardial perfusion 02/22/2010     Fixed defect in inferior base c/w artifact vs prior inferobasal injury. LVE. EF 55%. MIld inferobasal HK noted.   ??? Hypertrophy of  prostate with urinary obstruction and other lower urinary tract symptoms (LUTS)    ??? Elevated prostate specific antigen (PSA)    ??? Microscopic hematuria    ??? Hypertension    ??? Right renal cyst    ??? ASHD (arteriosclerotic heart disease)      Review of systems  Cardiac no recent problems chest pain or palpitations his treadmill study has been normal.    Physical exam    Rectum is without mass  Prostate 25 g smooth and nontender  Urinalysis to 3 red cells, 1-3 white cells    PSA 7/13---3.3  12/12---3.0  6/12---2.8  12/11---5.9  3/11---2.3    Impression  #1 history of an elevated PSA x1. PSA is subsequently returned to normal. He has a benign feeling gland.  #2 long history of benign microscopic hematuria    Plan  #1 continue to follow PSA every 6 months.    PLEASE NOTE:      This document has  been produced using voice recognition software.  Unrecognized errors in transcription may be present.    Alveria Apley., M.D.

## 2011-09-26 MED ORDER — POTASSIUM CHLORIDE SR 8 MEQ TAB
8 mEq | ORAL_TABLET | Freq: Every day | ORAL | Status: DC
Start: 2011-09-26 — End: 2011-10-11

## 2011-09-26 NOTE — Progress Notes (Signed)
HISTORY OF PRESENT ILLNESS  Raymond [name] is a 66 y.o. male.    HPI  He has been doing very well. He just had 3 1/2 week trip to [place] where he had a good time. He did rent a car and drove around in [place] with no difficulties. He denies chest pain, dyspnea, orthopnea or PND.  He denies palpitations, dizziness or syncope.  He has had no symptoms to indicate TIA or amaurosis fugax. He has had no recurrence of palpitations or sinking feeling.      He has a history of known coronary artery disease and had an acute inferior wall myocardial infarction on October 12, 2001, and had stenting of the right coronary artery at that time. In January 2006, he started experiencing frequent palpitations and dizziness with a feeling as if he was dropping down on a roller coaster. He was admitted to [facility] on February 27, 2004 with an episode of the sinking feeling and nonsustained ventricular tachycardia. He subsequently underwent cardiac catheterization followed by CABG X3 on March 03, 2004, which consisted of:   1. LIMA to the LAD.   2. Sequential SVG to the diagonal and obtuse marginal branch.   His preoperative EF was in the 45% range. He was treated with amiodarone for nonsustained ventricular tachycardia and has had no recurrence of the sinking feeling or palpitations since the bypass surgery. He developed a large right pleural effusion which was once drained, but he has had some reaccumulation. Because of the sinking feeling, he had a 24-hour Holter monitor which failed to reveal a significant cardiac arrhythmia other than PVCs. He did have the sinking feeling while he was wearing the monitor, but it did not coincide with any significant ventricular arrhythmia.   He had the follow up stress nuclear cardiac imaging on 02/17/2010 which demonstrated fixed defect involving the inferior base with no significant ischemia. Ejection fraction was estimated at 55%.    Review of Systems   Constitutional: Negative for  weight loss and malaise/fatigue.   HENT: Negative for hearing loss.    Eyes: Negative for double vision.   Respiratory: Negative for shortness of breath.    Cardiovascular: Negative for chest pain, palpitations, orthopnea, claudication, leg swelling and PND.   Gastrointestinal: Negative for heartburn and blood in stool.   Genitourinary: Negative for hematuria.   Musculoskeletal: Positive for joint pain. Negative for back pain.   Neurological: Negative for dizziness, loss of consciousness and weakness.   Psychiatric/Behavioral: Negative for memory loss.       Physical Exam   Constitutional: He is oriented to person, place, and time. He appears well-developed and well-nourished.   HENT:   Head: Normocephalic.   Eyes: Conjunctivae are normal. Pupils are equal, round, and reactive to light.   Neck: Normal range of motion. Neck supple. No JVD present.   Cardiovascular: Regular rhythm, S1 normal and S2 normal.   No extrasystoles are present. Bradycardia present.  PMI is not displaced.  Exam reveals no gallop and no friction rub.    No murmur heard.  Pulses:       Carotid pulses are 3+ on the right side, and 3+ on the left side.  Pulmonary/Chest: Effort normal. He has no rales.   Abdominal: Soft. There is no tenderness.   Musculoskeletal: He exhibits no edema.   Neurological: He is alert and oriented to person, place, and time.   Skin: Skin is warm and dry.     BP 140/80  Pulse 51   Ht 5\' 9"  (1.753 m)   Wt 85.73 kg (189 lb)   BMI 27.91 kg/m2   SpO2 98%    Past Medical History   Diagnosis Date   ??? Coronary artery disease      inferior wall myocardial infarction and stenting of the RCA 9/03; Status post CABG X3 in January 2006.   ??? Myocardial infarction, inferior wall 10/12/01     acute   ??? Hypertension    ??? Hypercholesteremia      low HDL   ??? Ventricular tachycardia, nonsustained    ??? 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 Pleural effusion on right    ??? Echocardiogram 11/08/2005     LVE.  EF 45-50%.  Inferior, inferolateral WMA.     ??? S/P  cardiac cath 02/29/2004     oRCA 100%.  LM patent.  p/mLAD 85%.  D1 90%.  D2 90%.  CX 45%.  LVEDP 12 mmHg.  EF 45%.  CABG recommended.   ??? Myocardial perfusion 02/22/2010     Fixed defect in inferior base c/w artifact vs prior inferobasal injury. LVE. EF 55%. MIld inferobasal HK noted.   ??? Hypertrophy of prostate with urinary obstruction and other lower urinary tract symptoms (LUTS)    ??? Elevated prostate specific antigen (PSA)    ??? Microscopic hematuria    ??? Hypertension    ??? Right renal cyst    ??? ASHD (arteriosclerotic heart disease)        Past Surgical History   Procedure Date   ??? Stent insertion 10/12/01     right coronary artery   ??? Hx heart catheterization 03/03/04     followed by CABG X3   ??? Hx coronary artery bypass graft 03/03/04     LIMA to the LAD. Sequential SVG to the diagonal and obtuse marginal branch   ??? Hx coronary stent placement 10/12/01     RCA stented using 3.5 x 13 mm Express 2 stent   ??? Hx tonsillectomy        Current Outpatient Prescriptions   Medication Sig Dispense Refill   ??? simvastatin (ZOCOR) 20 mg tablet Take 1 Tab by mouth nightly.  30 Tab  5   ??? omega-3 acid ethyl esters (LOVAZA) 1 gram capsule Take 1 Cap by mouth every evening.  30 Cap  11   ??? losartan-hydrochlorothiazide (HYZAAR) 100-12.5 mg per tablet Take 1 Tab by mouth daily.  30 Tab  11   ??? carvedilol (COREG) 12.5 mg tablet Take 1 Tab by mouth two (2) times a day.  90 Tab  3   ??? amLODIPine (NORVASC) 2.5 mg tablet Take 1 Tab by mouth daily.  30 Tab  6   ??? niacin ER (NIASPAN) 500 mg tablet Take 3 Tabs by mouth nightly.  270 Tab  4   ??? Triamcinolone Acetonide (NASACORT AQ) 55 mcg/actuation Aero nasal spray 2 Sprays by Both Nostrils route daily.  1 Container  11   ??? aspirin 81 mg tablet Take 81 mg by mouth daily.       ??? MULTIVITAMIN PO Take 1 Tab by mouth daily.           EKG: unchanged from previous tracings, sinus bradycardia, RBBB, inf.scar  .   ASSESSMENT and PLAN  Encounter Diagnoses   Name Primary?   ??? CAD, history of inferior  wall myocardial infarction/status post stenting of the RCA in September 2003/status post CABG X3 in January 2006/EF 45%. Yes   ???  Nonsustained ventricular tachycardia    ??? Hypercholesteremia    ??? Hypertension    He continues to do well. He has had no symptoms to indicate angina or cardiac decompensation. He has had no symptoms to indicate recurrent ventricular tachycardia. His cholesterol profile is excellent with high HDL and satisfactory LDL in the 70s. His blood pressure has been under control and his activity level has been satisfactory. For now, he will be continued on the current medical regimen.

## 2011-09-26 NOTE — Patient Instructions (Signed)
Start K-dur 8 meq daily

## 2011-10-11 LAB — METABOLIC PANEL, BASIC
BUN/Creatinine ratio: 15 (ref 10–22)
BUN: 18 mg/dL (ref 8–27)
CO2: 24 mmol/L (ref 19–28)
Calcium: 9.1 mg/dL (ref 8.6–10.2)
Chloride: 105 mmol/L (ref 97–108)
Creatinine: 1.23 mg/dL (ref 0.76–1.27)
GFR est non-AA: 60 mL/min/{1.73_m2} (ref >59)
Glucose: 104 mg/dL — ABNORMAL HIGH (ref 65–99)
Potassium: 3.6 mmol/L (ref 3.5–5.2)
Sodium: 145 mmol/L — ABNORMAL HIGH (ref 134–144)
eGFR If African American: 70 mL/min/{1.73_m2} (ref >59)

## 2011-10-11 LAB — MAGNESIUM: Magnesium: 1.9 mg/dL (ref 1.6–2.6)

## 2011-10-11 NOTE — Progress Notes (Signed)
Quick Note:    Please review- per your note   Start K-dur 8 meq daily   BMP / Mag in 2 weeks  ______

## 2011-10-11 NOTE — Telephone Encounter (Signed)
Patient aware of results

## 2011-10-11 NOTE — Telephone Encounter (Signed)
Message copied by Orlin Hilding on Wed Oct 11, 2011  2:32 PM  ------       Message from: [place], [place] B       Created: Wed Oct 11, 2011  2:04 PM         Make KCL 10 meq a day.       ----- Message -----          From: Orlin Hilding          Sent: 10/11/2011   9:03 AM            To: Edrick Oh, MD              Please review- per your note        Start K-dur 8 meq daily        BMP / Mag in 2 weeks

## 2011-11-16 ENCOUNTER — Other Ambulatory Visit

## 2011-12-25 NOTE — Telephone Encounter (Signed)
Last visit:09/01/2011  RX  Class sent: normal

## 2011-12-25 NOTE — Telephone Encounter (Signed)
From: [name]  To: [name], MD  Sent: 12/24/2011 10:01 PM EST  Subject: Medication Renewal Request    Original authorizing provider: Greggory Brandy. Jill Poling, MD    [name] would like a refill of the following medications:  amLODIPine (NORVASC) 2.5 mg tablet [Roderick [name], MD]    Preferred pharmacy: [facility]    Comment:

## 2012-01-01 NOTE — Telephone Encounter (Signed)
From: [name]  To: [name], MD  Sent: 12/30/2011 10:31 AM EST  Subject: Medication Renewal Request    Original authorizing provider: Greggory Brandy. Jill Poling, MD    [name] would like a refill of the following medications:  carvedilol (COREG) 12.5 mg tablet [Roderick [name], MD]    Preferred pharmacy: [facility]    Comment:

## 2012-01-01 NOTE — Telephone Encounter (Signed)
Last visit:09/01/2011  RX  Class sent: normal

## 2012-02-12 NOTE — Telephone Encounter (Signed)
Last visit:09/01/2011  RX  Class sent: normal

## 2012-02-12 NOTE — Telephone Encounter (Signed)
From: [name]  To: [name], MD  Sent: 02/12/2012 8:11 AM EST  Subject: Medication Renewal Request    Original authorizing provider: Greggory Brandy. Jill Poling, MD    [name] would like a refill of the following medications:  simvastatin (ZOCOR) 20 mg tablet [Roderick [name], MD]    Preferred pharmacy: [facility]    Comment:

## 2012-02-27 NOTE — Addendum Note (Signed)
Addended by: Rondel Jumbo on: 02/27/2012 08:31 AM     Modules accepted: Orders

## 2012-02-28 LAB — METABOLIC PANEL, COMPREHENSIVE
A-G Ratio: 1.6 (ref 0.6–2.2)
ALT (SGPT): 12 U/L (ref 5–30)
AST (SGOT): 9 U/L (ref 7–31)
Albumin: 3.9 g/dL (ref 3.5–5.0)
Alk. phosphatase: 47 U/L (ref 35–123)
BUN/Creatinine ratio: 13
BUN: 16 mg/dL (ref 8–26)
Bilirubin, total: 0.7 mg/dL (ref 0.2–1.0)
CO2: 31.6 mEq/L — ABNORMAL HIGH (ref 24.0–31.0)
Calcium: 9.5 mg/dL (ref 8.5–10.5)
Chloride: 104 mmol/L (ref 101–110)
Creatinine: 1.25 mg/dL (ref 0.70–1.30)
GFR est AA: 68 mL/min/{1.73_m2} (ref >59)
GFR est non-AA: 59 mL/min/{1.73_m2} — ABNORMAL LOW (ref >59)
GLOBULIN, TOTAL: 2.4 g/dL (ref 2.0–4.8)
Glucose: 106 mg/dL — ABNORMAL HIGH (ref 65–99)
Potassium: 3.7 mmol/L (ref 3.5–5.1)
Protein, total: 6.3 g/dL — ABNORMAL LOW (ref 6.4–8.3)
Sodium: 140 mmol/L (ref 136–145)

## 2012-02-28 LAB — CBC WITH AUTOMATED DIFF
ABS. BASOPHILS: 0 10*3/uL (ref 0.0–0.2)
ABS. EOSINOPHILS: 0.2 10*3/uL (ref 0.0–0.4)
ABS. MONOCYTES: 0.5 10*3/uL (ref 0.1–0.9)
ABS. NEUTROPHILS: 3.1 10*3/uL (ref 1.4–7.0)
Abs Lymphocytes: 1.4 10*3/uL (ref 0.7–3.1)
BASOPHILS: 1 % (ref 0–3)
EOSINOPHILS: 4 % (ref 0–5)
HCT: 38.5 % (ref 37.5–51.0)
HGB: 13.1 g/dL (ref 12.6–17.7)
Lymphocytes: 26 % (ref 14–46)
MCH: 31 pg (ref 26.6–33.0)
MCHC: 34 g/dL (ref 31.5–35.7)
MCV: 91 fL (ref 79–97)
MONOCYTES: 10 % (ref 4–12)
NEUTROPHILS: 59 % (ref 40–74)
PLATELET: 139 10*3/uL — ABNORMAL LOW (ref 155–379)
RBC: 4.22 x10E6/uL (ref 4.14–5.80)
RDW: 13.8 % (ref 12.3–15.4)
WBC: 5.2 10*3/uL (ref 3.4–10.8)

## 2012-02-28 LAB — URINALYSIS W/ RFLX MICROSCOPIC
Bilirubin: NEGATIVE
Glucose: NEGATIVE
Ketone: NEGATIVE
Leukocyte Esterase: NEGATIVE
Nitrites: NEGATIVE
Protein: NEGATIVE
Specific Gravity: 1.02 (ref 1.005–1.030)
Urobilinogen: 0.2 mg/dL (ref 0.0–1.9)
pH (UA): 7 (ref 5.0–7.5)

## 2012-02-28 LAB — MICROSCOPIC EXAMINATION
Cast type: NEGATIVE
Casts: NEGATIVE /lpf
Crystal type: NEGATIVE
Crystals: NEGATIVE
Epithelial cells, renal: NEGATIVE /hpf
Mucus: NEGATIVE
Trichomonas: NEGATIVE
Yeast: NEGATIVE

## 2012-02-28 LAB — LIPID PANEL
Cholesterol, total: 140 mg/dL (ref 100–199)
HDL Cholesterol: 61 mg/dL (ref >39)
LDL, calculated: 64 mg/dL (ref 0–160)
Triglyceride: 75 mg/dL (ref 0–149)
VLDL, calculated: 15 mg/dL (ref 5–40)

## 2012-02-28 LAB — TSH 3RD GENERATION: TSH: 5.13 u[IU]/mL — ABNORMAL HIGH (ref 0.450–4.500)

## 2012-02-28 LAB — PSA SCREENING (SCREENING): Prostate Specific Ag: 3.3 ng/mL (ref 0.0–4.0)

## 2012-02-28 LAB — HEMOGLOBIN A1C WITH EAG: Hemoglobin A1c: 5.9 % — ABNORMAL HIGH (ref 4.8–5.6)

## 2012-03-01 NOTE — Progress Notes (Signed)
Reviewed chart in preparation of office visit and have obtained necessary documentation.

## 2012-03-05 LAB — AMB POC URINALYSIS DIP STICK AUTO W/O MICRO
Bilirubin (UA POC): NEGATIVE
Glucose (UA POC): NEGATIVE
Ketones (UA POC): NEGATIVE
Leukocyte esterase (UA POC): NEGATIVE
Nitrites (UA POC): NEGATIVE
Protein (UA POC): NEGATIVE mg/dL
Specific gravity (UA POC): 1.02 (ref 1.001–1.035)
Urobilinogen (UA POC): 0.2 (ref 0.2–1)
pH (UA POC): 7 (ref 4.6–8.0)

## 2012-03-05 NOTE — Progress Notes (Signed)
Raymond [name], Raymond [name] Nov 06, 1945, is a 67 y.o. male, who is seen today for Reevaluation of his recurrent disease hyperlipidemia impaired fasting glucose hypertension and elevated PSA. He saw Dr. Thomasena Edis again today and is having urine sent for cytology apparently. He has had microscopic hematuria. He is feeling well except for some left shoulder pain. He was installing a dishwasher about 2 months ago and thinks he strained his shoulder then. The pain is a lot better now but certain movements of the shoulder still hurts in the shoulder and into the biceps region. No chest pain or shortness of breath he is continuing his healthy diet and medication. He has a followup with his cardiologist in the fairly near future.    Past Medical History   Diagnosis Date   ??? Coronary artery disease      inferior wall myocardial infarction and stenting of the RCA 9/03; Status post CABG X3 in January 2006.   ??? Myocardial infarction, inferior wall 10/12/01     acute   ??? Hypertension    ??? Hypercholesteremia      low HDL   ??? Ventricular tachycardia, nonsustained    ??? 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 Pleural effusion on right    ??? Echocardiogram 11/08/2005     LVE.  EF 45-50%.  Inferior, inferolateral WMA.     ??? S/P cardiac cath 02/29/2004     oRCA 100%.  LM patent.  p/mLAD 85%.  D1 90%.  D2 90%.  CX 45%.  LVEDP 12 mmHg.  EF 45%.  CABG recommended.   ??? Myocardial perfusion 02/22/2010     Fixed defect in inferior base c/w artifact vs prior inferobasal injury. LVE. EF 55%. MIld inferobasal HK noted.   ??? Hypertrophy of prostate with urinary obstruction and other lower urinary tract symptoms (LUTS)    ??? Elevated prostate specific antigen (PSA)    ??? Microscopic hematuria    ??? Hypertension    ??? Right renal cyst    ??? ASHD (arteriosclerotic heart disease)      Past Surgical History   Procedure Laterality Date   ??? Stent insertion  10/12/01     right coronary artery   ??? Hx heart catheterization  03/03/04     followed by CABG X3   ??? Hx coronary artery bypass graft   03/03/04     LIMA to the LAD. Sequential SVG to the diagonal and obtuse marginal branch   ??? Hx coronary stent placement  10/12/01     RCA stented using 3.5 x 13 mm Express 2 stent   ??? Hx tonsillectomy       Current Outpatient Prescriptions   Medication Sig Dispense Refill   ??? simvastatin (ZOCOR) 20 mg tablet Take 1 Tab by mouth nightly.  30 Tab  5   ??? carvedilol (COREG) 12.5 mg tablet Take 1 Tab by mouth two (2) times a day.  180 Tab  3   ??? amLODIPine (NORVASC) 2.5 mg tablet Take 1 Tab by mouth daily.  30 Tab  6   ??? potassium chloride SR (KLOR-CON) 10 mEq tablet Take 1 Tab by mouth daily.  30 Tab  5   ??? triamcinolone (NASACORT AQ) 55 mcg nasal inhaler INSTILL 2 SPRAYS INTO EACH NOSTRIL DAILY  17 g  9   ??? omega-3 acid ethyl esters (LOVAZA) 1 gram capsule Take 1 Cap by mouth every evening.  30 Cap  11   ??? losartan-hydrochlorothiazide (HYZAAR) 100-12.5 mg per tablet Take 1  Tab by mouth daily.  30 Tab  11   ??? niacin ER (NIASPAN) 500 mg tablet Take 3 Tabs by mouth nightly.  270 Tab  4   ??? aspirin 81 mg tablet Take 81 mg by mouth daily.       ??? MULTIVITAMIN PO Take 1 Tab by mouth daily.         Allergies   Allergen Reactions   ??? Ace Inhibitors Unknown (comments)     Intolerance b/c of dry coughing   ??? Bee Sting (Sting, Bee) Hives     History     Social History   ??? Marital Status: MARRIED     Spouse Name: N/A     Number of Children: N/A   ??? Years of Education: N/A     Social History Main Topics   ??? Smoking status: Never Smoker    ??? Smokeless tobacco: Never Used   ??? Alcohol Use: Yes      Comment: social   ??? Drug Use: No   ??? Sexually Active:      Other Topics Concern   ??? Not on file     Social History Narrative   ??? No narrative on file     Visit Vitals   Item Reading   ??? BP 140/68   ??? Pulse 49   ??? Temp(Src) 97.7 ??F (36.5 ??C) (Oral)   ??? Resp 14   ??? Ht 5\' 9"  (1.753 m)   ??? Wt 184 lb (83.462 kg)   ??? BMI 27.16 kg/m2   ??? SpO2 98%     The patient is a well-developed, well-nourished male in no apparent distress. HEENT: Pupils are equal  and react to light and extraocular movements are full. Fundi revealed no AV crossing changes and no hemorrhages or exudates. Ear canals and tympanic membranes appear normal. Oral cavity appears normal with no oral lesions. Neck: Carotids are 2+ without bruits. No adenopathy or thyromegaly. Lungs are clear to percussion. I hear no wheezing, rales or rhonchi. Heart reveals a nondisplaced apical impulse in the fifth interspace at the midclavicular line. Rhythm is regular and no murmur, gallop, click or rub. Abdomen is soft and nontender with no hepatosplenomegaly or masses. No distention or tympany and normoactive bowel sounds. Extremities reveal no clubbing cyanosis or edema. Pulses are 2+ throughout. Normal external genitalia with no hernia. Rectal exam was not repeated today having been done regularly by his urologist.  No suspicious skin growths.    Results for orders placed in visit on 03/05/12   AMB POC URINALYSIS DIP STICK AUTO W/O MICRO       Result Value Range    Color (UA POC) Yellow      Clarity (UA POC) Clear      Glucose (UA POC) Negative  Negative    Bilirubin (UA POC) Negative  Negative    Ketones (UA POC) Negative  Negative    Specific gravity (UA POC) 1.020  1.001 - 1.035    Blood (UA POC) Trace  Negative    pH (UA POC) 7.0  4.6 - 8.0    Protein (UA POC) Negative  Negative mg/dL    Urobilinogen (UA POC) 0.2 mg/dL  0.2 - 1    Nitrites (UA POC) Negative  Negative    Leukocyte esterase (UA POC) Negative  Negative     Glucose 106. Cluster 140 triglycerides 75 HDL 61 LDL 64. Other chemistries are normal and PSA 3.3.    Assessment: #1. Hyperlipidemia doing well  but perhaps overtreated. Current evidence to base guidelines would favor using atorvastatin at 40 mg daily and probably getting rid of niacin and commercial fish oil. I have written this down as to my suggestion and he will check with his cardiologist on that. #2. ASHD doing well since bypass surgery 2006. He will continue aspirin 81 mg daily and  exercise. #3. Impaired fasting glucose unchanged. He is actually not diabetic, I removed them from his problem list. #4. Hypertension is well controlled. He will continue Hyzaar 100-12.5, one daily and amlodipine 2.5 mg daily.    Followup 6 months with lab and will give him a flu shot today. He says he finds he is ready to get a colonoscopy so I gave him those names again, it then asking him every physical for several years to consider getting that done.

## 2012-03-05 NOTE — Progress Notes (Signed)
Chief complaint elevated PSA    Raymond [name] Has a long-standing history of microscopic hematuria has been benign. He also had an elevated PSA based on one sample. His PSA is returned to normal. He's never had a prostate biopsy. He has no frequency or dysuria no history of gross hematuria he has a good urine stream.    Current Outpatient Prescriptions   Medication Sig Dispense Refill   ??? simvastatin (ZOCOR) 20 mg tablet Take 1 Tab by mouth nightly.  30 Tab  5   ??? carvedilol (COREG) 12.5 mg tablet Take 1 Tab by mouth two (2) times a day.  180 Tab  3   ??? amLODIPine (NORVASC) 2.5 mg tablet Take 1 Tab by mouth daily.  30 Tab  6   ??? potassium chloride SR (KLOR-CON) 10 mEq tablet Take 1 Tab by mouth daily.  30 Tab  5   ??? triamcinolone (NASACORT AQ) 55 mcg nasal inhaler INSTILL 2 SPRAYS INTO EACH NOSTRIL DAILY  17 g  9   ??? omega-3 acid ethyl esters (LOVAZA) 1 gram capsule Take 1 Cap by mouth every evening.  30 Cap  11   ??? losartan-hydrochlorothiazide (HYZAAR) 100-12.5 mg per tablet Take 1 Tab by mouth daily.  30 Tab  11   ??? niacin ER (NIASPAN) 500 mg tablet Take 3 Tabs by mouth nightly.  270 Tab  4   ??? aspirin 81 mg tablet Take 81 mg by mouth daily.       ??? MULTIVITAMIN PO Take 1 Tab by mouth daily.         Past Medical History   Diagnosis Date   ??? Coronary artery disease      inferior wall myocardial infarction and stenting of the RCA 9/03; Status post CABG X3 in January 2006.   ??? Myocardial infarction, inferior wall 10/12/01     acute   ??? Hypertension    ??? Hypercholesteremia      low HDL   ??? Ventricular tachycardia, nonsustained    ??? 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 Pleural effusion on right    ??? Echocardiogram 11/08/2005     LVE.  EF 45-50%.  Inferior, inferolateral WMA.     ??? S/P cardiac cath 02/29/2004     oRCA 100%.  LM patent.  p/mLAD 85%.  D1 90%.  D2 90%.  CX 45%.  LVEDP 12 mmHg.  EF 45%.  CABG recommended.   ??? Myocardial perfusion 02/22/2010     Fixed defect in inferior base c/w artifact vs prior inferobasal injury. LVE.  EF 55%. MIld inferobasal HK noted.   ??? Hypertrophy of prostate with urinary obstruction and other lower urinary tract symptoms (LUTS)    ??? Elevated prostate specific antigen (PSA)    ??? Microscopic hematuria    ??? Hypertension    ??? Right renal cyst    ??? ASHD (arteriosclerotic heart disease)      Review of systems  Cardiac no chest pain or palpitations  GI no nausea vomiting diarrhea or constipation        Urinalysis 3-5 red cells occasional epithelial cell    PSA 1/14---3.3  12/12---3.0\\    Impression  #1 history of benign microscopic hematuria  #2 history of elevated PSA basal one sample. PSA has returned to normal and remained normal.    Plan  #1 urine cytology today  #2 followup in 6 months with a PSA and DRE    PLEASE NOTE:  This document has been produced using voice recognition software.  Unrecognized errors in transcription may be present.    Providence Lanius., M.D.

## 2012-04-01 NOTE — Progress Notes (Signed)
HISTORY OF PRESENT ILLNESS  Raymond [name] is a 67 y.o. male.    HPI  Raymond [name] has been doing quite well.  Other than occasional palpitations as a skipping sensation for a few seconds, he has no cardiac complaints. He denies chest pain, dyspnea, orthopnea or PND. He has had no dizziness or syncope. He has had no symptoms to indicate TIA or amaurosis fugax. His cholesterol profile has been very satisfactory including total cholesterol 140, triglycerides 75, HDL 61, and LDL 64 on 02/27/12.      He has a history of known coronary artery disease and had an acute inferior wall myocardial infarction on October 12, 2001, and had stenting of the right coronary artery at that time. In January 2006, he started experiencing frequent palpitations and dizziness with a feeling as if he was dropping down on a roller coaster. He was admitted to [facility] on February 27, 2004 with an episode of the sinking feeling and nonsustained ventricular tachycardia. He subsequently underwent cardiac catheterization followed by CABG X3 on March 03, 2004, which consisted of:   1. LIMA to the LAD.   2. Sequential SVG to the diagonal and obtuse marginal branch.   His preoperative EF was in the 45% range. He was treated with amiodarone for nonsustained ventricular tachycardia and has had no recurrence of the sinking feeling or palpitations since the bypass surgery. He developed a large right pleural effusion which was once drained, but he has had some reaccumulation. Because of the sinking feeling, he had a 24-hour Holter monitor which failed to reveal a significant cardiac arrhythmia other than PVCs. He did have the sinking feeling while he was wearing the monitor, but it did not coincide with any significant ventricular arrhythmia.   He had the follow up stress nuclear cardiac imaging on 02/17/2010 which demonstrated fixed defect involving the inferior base with no significant ischemia. Ejection fraction was estimated at 55%.    Review  of Systems   Constitutional: Negative for weight loss and malaise/fatigue.   HENT: Negative for hearing loss.    Eyes: Negative for blurred vision and double vision.   Respiratory: Negative for shortness of breath.    Cardiovascular: Positive for palpitations. Negative for chest pain, orthopnea, claudication, leg swelling and PND.   Gastrointestinal: Negative for heartburn, nausea and blood in stool.   Genitourinary: Negative for hematuria.   Musculoskeletal: Negative for back pain and joint pain.   Neurological: Negative for dizziness, loss of consciousness, weakness and headaches.   Psychiatric/Behavioral: Negative for memory loss.       Physical Exam   Constitutional: He is oriented to person, place, and time. He appears well-developed and well-nourished.   HENT:   Head: Normocephalic.   Eyes: Conjunctivae are normal. Pupils are equal, round, and reactive to light.   Neck: Normal range of motion. Neck supple. No JVD present.   Cardiovascular: Regular rhythm, S1 normal and S2 normal.   No extrasystoles are present. Bradycardia present.  PMI is not displaced.  Exam reveals no gallop and no friction rub.    No murmur heard.  Pulses:       Carotid pulses are 3+ on the right side, and 3+ on the left side.  Pulmonary/Chest: Effort normal. He has no rales.   Abdominal: Soft. There is no tenderness.   Musculoskeletal: He exhibits no edema.   Neurological: He is alert and oriented to person, place, and time.   Skin: Skin is warm and dry.  BP 140/84   Pulse 47   Ht 5\' 9"  (1.753 m)   Wt 84.823 kg (187 lb)   BMI 27.6 kg/m2   SpO2 97%    Past Medical History   Diagnosis Date   ??? Coronary artery disease      inferior wall myocardial infarction and stenting of the RCA 9/03; Status post CABG X3 in January 2006.   ??? Myocardial infarction, inferior wall 10/12/01     acute   ??? Hypertension    ??? Hypercholesteremia      low HDL   ??? Ventricular tachycardia, nonsustained    ??? 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 Pleural effusion on right    ???  Echocardiogram 11/08/2005     LVE.  EF 45-50%.  Inferior, inferolateral WMA.     ??? S/P cardiac cath 02/29/2004     oRCA 100%.  LM patent.  p/mLAD 85%.  D1 90%.  D2 90%.  CX 45%.  LVEDP 12 mmHg.  EF 45%.  CABG recommended.   ??? Myocardial perfusion 02/22/2010     Fixed defect in inferior base c/w artifact vs prior inferobasal injury. LVE. EF 55%. MIld inferobasal HK noted.   ??? Hypertrophy of prostate with urinary obstruction and other lower urinary tract symptoms (LUTS)    ??? Elevated prostate specific antigen (PSA)    ??? Microscopic hematuria    ??? Hypertension    ??? Right renal cyst    ??? ASHD (arteriosclerotic heart disease)        Past Surgical History   Procedure Laterality Date   ??? Stent insertion  10/12/01     right coronary artery   ??? Hx heart catheterization  03/03/04     followed by CABG X3   ??? Hx coronary artery bypass graft  03/03/04     LIMA to the LAD. Sequential SVG to the diagonal and obtuse marginal branch   ??? Hx coronary stent placement  10/12/01     RCA stented using 3.5 x 13 mm Express 2 stent   ??? Hx tonsillectomy         Current Outpatient Prescriptions   Medication Sig Dispense Refill   ??? simvastatin (ZOCOR) 20 mg tablet Take 1 Tab by mouth nightly.  30 Tab  5   ??? carvedilol (COREG) 12.5 mg tablet Take 1 Tab by mouth two (2) times a day.  180 Tab  3   ??? amLODIPine (NORVASC) 2.5 mg tablet Take 1 Tab by mouth daily.  30 Tab  6   ??? potassium chloride SR (KLOR-CON) 10 mEq tablet Take 1 Tab by mouth daily.  30 Tab  5   ??? triamcinolone (NASACORT AQ) 55 mcg nasal inhaler INSTILL 2 SPRAYS INTO EACH NOSTRIL DAILY  17 g  9   ??? omega-3 acid ethyl esters (LOVAZA) 1 gram capsule Take 1 Cap by mouth every evening.  30 Cap  11   ??? losartan-hydrochlorothiazide (HYZAAR) 100-12.5 mg per tablet Take 1 Tab by mouth daily.  30 Tab  11   ??? niacin ER (NIASPAN) 500 mg tablet Take 3 Tabs by mouth nightly.  270 Tab  4   ??? aspirin 81 mg tablet Take 81 mg by mouth daily.       ??? MULTIVITAMIN PO Take 1 Tab by mouth daily.            EKG: unchanged from previous tracings, nonspecific ST and T waves changes, sinus bradycardia, RBBB, inf.scar  .  ASSESSMENT and PLAN  Encounter Diagnoses   Name Primary?   ??? CAD, history of inferior wall myocardial infarction/status post stenting of the RCA in September 2003/status post CABG X3 in January 2006/EF 45%. Yes   ??? Hypertension    ??? Nonsustained ventricular tachycardia    ??? Hypercholesteremia    He has been doing very well. He has had no symptoms to indicate angina or cardiac decompensation.  He has had no clinical indication of recurrent nonsustained ventricular tachycardia. His blood pressure has been under control. His cholesterol profile has been very satisfactory. He is currently on Simvastatin and Niaspan combination.  There has been no statistical data for improved outcome by Niaspan, however I would continue him on the current combination of Niaspan and Simvastatin since his cholesterol profile is too good to be changed.  This was discussed with the patient and he has a good understanding.    He is currently on Lovaza for the probable purpose of improving cardiac arrhythmia since he had the ventricular tachycardia in the past.  The Lovaza was not for cholesterol management or coronary prevention.

## 2012-04-01 NOTE — Telephone Encounter (Signed)
Verbal order per DAE B CHOUGH, MD

## 2012-04-01 NOTE — Telephone Encounter (Signed)
From: [name]  To: Edrick Oh, MD  Sent: 04/01/2012 1:58 PM EST  Subject: Medication Renewal Request    Original authorizing provider: Edrick Oh, MD    [name] would like a refill of the following medications:  potassium chloride SR (KLOR-CON) 10 mEq tablet Edrick Oh, MD]    Preferred pharmacy: [address] - [place], [place] - [address]    Comment:

## 2012-05-13 NOTE — Telephone Encounter (Signed)
From: [name]  To: [name], MD  Sent: 05/13/2012 8:11 AM EDT  Subject: Medication Renewal Request    Original authorizing provider: Greggory Brandy. Jill Poling, MD    [name] would like a refill of the following medications:  niacin ER (NIASPAN) 500 mg tablet [Roderick [name], MD]    Preferred pharmacy: [address] - [place], [place] - [address]    Comment:

## 2012-05-13 NOTE — Telephone Encounter (Signed)
Last ov 03/05/12.

## 2012-07-29 NOTE — Telephone Encounter (Signed)
Last ov 03/05/12.

## 2012-07-29 NOTE — Telephone Encounter (Signed)
From: [name]  To: [name], MD  Sent: 07/27/2012 10:43 AM EDT  Subject: Medication Renewal Request    Original authorizing provider: Greggory Brandy. Jill Poling, MD    [name] would like a refill of the following medications:  losartan-hydrochlorothiazide (HYZAAR) 100-12.5 mg per tablet [Roderick R. Mackinnon, MD]  amLODIPine (NORVASC) 2.5 mg tablet [Roderick [name], MD]    Preferred pharmacy: [address] - [place], [place] - [address]    Comment:

## 2012-08-05 NOTE — Telephone Encounter (Signed)
From: [name]  To: [name], MD  Sent: 08/04/2012 9:13 AM EDT  Subject: Medication Renewal Request    Original authorizing provider: Greggory Brandy. Jill Poling, MD    [name] would like a refill of the following medications:  omega-3 acid ethyl esters (LOVAZA) 1 gram capsule [Roderick [name], MD]    Preferred pharmacy: [address] - [place], [place] - [address]    Comment:

## 2012-08-05 NOTE — Telephone Encounter (Signed)
Last visit:03/05/2012  RX  Class sent: normal

## 2012-08-12 NOTE — Telephone Encounter (Signed)
Last OV 03/05/12

## 2012-08-12 NOTE — Telephone Encounter (Signed)
From: [name]  To: [name], MD  Sent: 08/12/2012 8:27 AM EDT  Subject: Medication Renewal Request    Original authorizing provider: Greggory Brandy. Jill Poling, MD    [name] would like a refill of the following medications:  simvastatin (ZOCOR) 20 mg tablet [Roderick [name], MD]    Preferred pharmacy: [address] - [place], [place] - [address]    Comment:

## 2012-08-30 LAB — METABOLIC PANEL, COMPREHENSIVE
A-G Ratio: 1.6 CALC (ref 0.6–2.2)
ALT (SGPT): 6 U/L (ref 5–30)
AST (SGOT): 11 U/L (ref 7–31)
Albumin: 4.1 g/dL (ref 3.5–5.0)
Alk. phosphatase: 47 U/L — ABNORMAL LOW (ref 53–128)
BUN/Creatinine ratio: 16.2 CALC (ref 8.0–36.0)
BUN: 19 mg/dL (ref 8–26)
Bilirubin, total: 0.9 mg/dL (ref 0.2–1.3)
CO2: 29.3 mEq/L (ref 24.0–31.0)
Calcium: 9.7 mg/dL (ref 8.5–10.5)
Chloride: 105 mEq/L (ref 101–110)
Creatinine: 1.17 mg/dL (ref 0.70–1.30)
GFR est AA: >60 mL/min/{1.73_m2} (ref >=60)
GFR est non-AA: >60 mL/min/{1.73_m2} (ref >=60)
GLOBULIN, TOTAL: 2.5 g/dL (ref 2.0–4.8)
Glucose: 104 mg/dL — ABNORMAL HIGH (ref 65–99)
Potassium: 3.9 mEq/L (ref 3.5–5.1)
Protein, total: 6.6 g/dL (ref 6.4–8.3)
Sodium: 143 mEq/L (ref 135–145)

## 2012-08-30 LAB — PSA SCREENING (SCREENING): Prostate Specific Ag: 2.7 ng/mL (ref 0.0–4.0)

## 2012-08-30 LAB — LIPID PANEL
Cholesterol, total: 149 mg/dL (ref 100–199)
HDL Cholesterol: 63 mg/dL (ref 40–78)
LDL, calculated: 74 mg/dL (calc) (ref 0–160)
Triglyceride: 60 mg/dL (ref 0–149)
VLDL, calculated: 12 mg/dL (calc)

## 2012-08-30 LAB — CVD REPORT: PDF IMAGE: 0

## 2012-09-04 NOTE — Progress Notes (Signed)
Reviewed chart in preparation of office visit and have obtained necessary documentation.

## 2012-09-05 NOTE — Progress Notes (Signed)
Raymond [name], Raymond [name] May 26, 1945, is a 67 y.o. male, who is seen today for Reevaluation of his recurrent disease hypertension hyperlipidemia. He has been feeling well in general with no chest pain or shortness of breath. He takes all of his medicine regularly but the moment ago he got back from a 3 week trip to [place] and 8 within usual. For quite some time he has had some popping and sense of weakness in the lateral left ankle when he goes down stairs and when he is first on it for a couple of minutes and then it gets better. Also he has noted some lower leg swelling on the right during his recent trip and it has gotten better but continues to swallow but through the day ever since then. No pain in the calf or thigh. This morning he awakened with discomfort along the radial surface of his lower right arm but that feels better now but he did when he first woke up.    Past Medical History   Diagnosis Date   ??? Coronary artery disease      inferior wall myocardial infarction and stenting of the RCA 9/03; Status post CABG X3 in January 2006.   ??? Myocardial infarction, inferior wall 10/12/01     acute   ??? Hypertension    ??? Hypercholesteremia      low HDL   ??? Ventricular tachycardia, nonsustained    ??? 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 Pleural effusion on right    ??? Echocardiogram 11/08/2005     LVE.  EF 45-50%.  Inferior, inferolateral WMA.     ??? S/P cardiac cath 02/29/2004     oRCA 100%.  LM patent.  p/mLAD 85%.  D1 90%.  D2 90%.  CX 45%.  LVEDP 12 mmHg.  EF 45%.  CABG recommended.   ??? Myocardial perfusion 02/22/2010     Fixed defect in inferior base c/w artifact vs prior inferobasal injury. LVE. EF 55%. MIld inferobasal HK noted.   ??? Hypertrophy of prostate with urinary obstruction and other lower urinary tract symptoms (LUTS)    ??? Elevated prostate specific antigen (PSA)    ??? Microscopic hematuria    ??? Hypertension    ??? Right renal cyst    ??? ASHD (arteriosclerotic heart disease)      Past Surgical History   Procedure Laterality Date    ??? Stent insertion  10/12/01     right coronary artery   ??? Hx heart catheterization  03/03/04     followed by CABG X3   ??? Hx coronary artery bypass graft  03/03/04     LIMA to the LAD. Sequential SVG to the diagonal and obtuse marginal branch   ??? Hx coronary stent placement  10/12/01     RCA stented using 3.5 x 13 mm Express 2 stent   ??? Hx tonsillectomy       Current Outpatient Prescriptions   Medication Sig Dispense Refill   ??? simvastatin (ZOCOR) 20 mg tablet Take 1 Tab by mouth nightly.  30 Tab  5   ??? omega-3 acid ethyl esters (LOVAZA) 1 gram capsule Take 1 Cap by mouth every evening.  30 Cap  11   ??? losartan-hydrochlorothiazide (HYZAAR) 100-12.5 mg per tablet Take 1 Tab by mouth daily.  90 Tab  3   ??? amLODIPine (NORVASC) 2.5 mg tablet Take 1 Tab by mouth daily.  90 Tab  3   ??? niacin ER (NIASPAN) 500 mg tablet Take 3 Tabs  by mouth nightly.  270 Tab  3   ??? potassium chloride SR (KLOR-CON 10) 10 mEq tablet Take 1 Tab by mouth daily.  30 Tab  5   ??? carvedilol (COREG) 12.5 mg tablet Take 1 Tab by mouth two (2) times a day.  180 Tab  3   ??? triamcinolone (NASACORT AQ) 55 mcg nasal inhaler INSTILL 2 SPRAYS INTO EACH NOSTRIL DAILY  17 g  9   ??? aspirin 81 mg tablet Take 81 mg by mouth daily.       ??? MULTIVITAMIN PO Take 1 Tab by mouth daily.         Visit Vitals   Item Reading   ??? BP 140/66   ??? Pulse 60   ??? Temp(Src) 98.2 ??F (36.8 ??C) (Oral)   ??? Resp 14   ??? Ht 5\' 9"  (1.753 m)   ??? Wt 188 lb 8 oz (85.503 kg)   ??? BMI 27.82 kg/m2   ??? SpO2 99%     Carotids are 2+ without bruits. Lungs are clear to percussion. Good breath sounds with no wheezing or crackles. Heart reveals a regular rhythm with normal S1 and S2 no murmur gallop or rub. Pulses not palpable. Abdomen is soft and nontender with no hepatosplenomegaly or masses no bruits. Extremities reveal no clubbing or cyanosis, trace edema right ankle, none on the left. No tenderness in the thigh or calf. Good range of motion of his right wrist without pain. Very slight tenderness along  the lower radius on the right. Left ankle when he first flexed it. There is no tenderness. No deformity or effusion. Pulses are 2+ throughout.    Results for orders placed in visit on 08/29/12   LIPID PANEL       Result Value Range    Cholesterol, total 149  100 - 199 mg/dL    Triglyceride 60  0 - 149 mg/dL    HDL Cholesterol 63  40 - 78 mg/dL    VLDL, calculated 12      LDL, calculated 74  0 - 160 mg/dL (calc)   METABOLIC PANEL, COMPREHENSIVE       Result Value Range    Glucose 104 (*) 65 - 99 mg/dL    BUN 19  8 - 26 mg/dL    Creatinine 4.09  8.11 - 1.30 mg/dL    GFR est non-AA >91  >=60 mL/min/1.18m    GFR est AA >60  >=60 mL/min/1.64m    BUN/Creatinine ratio 16.2  8.0 - 36.0 CALC    Sodium 143  135 - 145 mEq/L    Potassium 3.9  3.5 - 5.1 mEq/L    Chloride 105  101 - 110 mEq/L    CO2 29.3  24.0 - 31.0 mEq/L    Calcium 9.7  8.5 - 10.5 mg/dL    Protein, total 6.6  6.4 - 8.3 g/dL    Albumin 4.1  3.5 - 5.0 g/dL    GLOBULIN, TOTAL 2.5  2.0 - 4.8 g/dL    A-G Ratio 1.6  0.6 - 2.2 CALC    Bilirubin, total 0.9  0.2 - 1.3 mg/dL    Alk. phosphatase 47 (*) 53 - 128 U/L    AST 11  7 - 31 U/L    ALT 6  5 - 30 U/L   CVD REPORT       Result Value Range    INTERPRETATION Note      PDF IMAGE .     PSA  SCREENING (SCREENING)       Result Value Range    Prostate Specific Ag 2.7  0.0 - 4.0 ng/mL      assessment: One. ASHDAsymptomatic. He will continue aspirin 81 mg daily and carvedilol 12.5 mg twice daily. #2. Hyperlipidemia doing well. We will continue simvastatin 20 mg each evening and at the cardiologist's recommendation he will stay on niacin 1500 mg at bedtime and commercial fish oil 1 g daily. #3. Hypertension is controlled. He'll continue Hyzaar 100/12.5 one daily and amlodipine 2.5 mg daily. #4. DJD left ankle. We will try using Aleve 2 tablets twice daily for 2 weeks and see how this helps. #5. Recent edema after recent trip, better. If this worsens we will get venous studies of the right.  #6. Right wrist pain this morning  already somewhat better. This is likely 2 continue to improve and he will call me if not.    We discussed Zostavax and he would like to have that done.  He states that he has a federal [employer] which should cover this medicine as it is right along. He will sign a waiver just in case.    followup in 6 months for complete evaluation    Reta Norgren [name], MD FACP    Please note:  This document has been produced using voice recognition software. Unrecognized errors in transcription may be present.

## 2012-09-12 LAB — AMB POC URINALYSIS DIP STICK AUTO W/O MICRO
Bilirubin (UA POC): NEGATIVE
Glucose (UA POC): NEGATIVE
Ketones (UA POC): NEGATIVE
Leukocyte esterase (UA POC): NEGATIVE
Nitrites (UA POC): NEGATIVE
Protein (UA POC): NEGATIVE mg/dL
Specific gravity (UA POC): 1.02 (ref 1.001–1.035)
Urobilinogen (UA POC): 0.2 (ref 0.2–1)
pH (UA POC): 6.5 (ref 4.6–8.0)

## 2012-09-12 NOTE — Progress Notes (Signed)
Chief complaint elevated PSA    Raymond [name] 67 year old male that has been followed for a history of benign microscopic hematuria. His PSA had bumped above for one sample and returned to normal. He has never had a prostate biopsy. Patient denies any dysuria or frequency gross hematuria or urinary hesitancy.    Current Outpatient Prescriptions   Medication Sig Dispense Refill   ??? simvastatin (ZOCOR) 20 mg tablet Take 1 Tab by mouth nightly.  30 Tab  5   ??? omega-3 acid ethyl esters (LOVAZA) 1 gram capsule Take 1 Cap by mouth every evening.  30 Cap  11   ??? losartan-hydrochlorothiazide (HYZAAR) 100-12.5 mg per tablet Take 1 Tab by mouth daily.  90 Tab  3   ??? amLODIPine (NORVASC) 2.5 mg tablet Take 1 Tab by mouth daily.  90 Tab  3   ??? niacin ER (NIASPAN) 500 mg tablet Take 3 Tabs by mouth nightly.  270 Tab  3   ??? potassium chloride SR (KLOR-CON 10) 10 mEq tablet Take 1 Tab by mouth daily.  30 Tab  5   ??? carvedilol (COREG) 12.5 mg tablet Take 1 Tab by mouth two (2) times a day.  180 Tab  3   ??? triamcinolone (NASACORT AQ) 55 mcg nasal inhaler INSTILL 2 SPRAYS INTO EACH NOSTRIL DAILY  17 g  9   ??? aspirin 81 mg tablet Take 81 mg by mouth daily.       ??? MULTIVITAMIN PO Take 1 Tab by mouth daily.         Past Medical History   Diagnosis Date   ??? Coronary artery disease      inferior wall myocardial infarction and stenting of the RCA 9/03; Status post CABG X3 in January 2006.   ??? Myocardial infarction, inferior wall 10/12/01     acute   ??? Hypertension    ??? Hypercholesteremia      low HDL   ??? Ventricular tachycardia, nonsustained    ??? 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 Pleural effusion on right    ??? Echocardiogram 11/08/2005     LVE.  EF 45-50%.  Inferior, inferolateral WMA.     ??? S/P cardiac cath 02/29/2004     oRCA 100%.  LM patent.  p/mLAD 85%.  D1 90%.  D2 90%.  CX 45%.  LVEDP 12 mmHg.  EF 45%.  CABG recommended.   ??? Myocardial perfusion 02/22/2010     Fixed defect in inferior base c/w artifact vs prior inferobasal injury. LVE. EF  55%. MIld inferobasal HK noted.   ??? Hypertrophy of prostate with urinary obstruction and other lower urinary tract symptoms (LUTS)    ??? Elevated prostate specific antigen (PSA)    ??? Microscopic hematuria    ??? Hypertension    ??? Right renal cyst    ??? ASHD (arteriosclerotic heart disease)        Review of systems  Cardiac no chest pain or palpitations  GI no nausea vomiting diarrhea or constipation        Urinalysis 4-5 red cells physical exam  Rectum without lesions  Prostate 30 g smooth nontender no nodules  PSA 7/14---2.7  1/14---3.3  12/12---3.0  6/12---2.8    Impression  #1 benign I prescribed hematuria  #2 PSA elevation in one sample but has returned to normal and she remained stable    Plan  #1 urine cytology today  #2 followup in one year with repeat PSA  PLEASE NOTE:      This document has been produced using voice recognition software.  Unrecognized errors in transcription may be present.    Providence Lanius., M.D.

## 2012-09-12 NOTE — Patient Instructions (Signed)
Blood in the Urine: After Your Visit  Your Care Instructions  Blood in the urine, or hematuria, may make the urine look red, brown, or pink. There may be blood every time you urinate or just from time to time. You cannot always see blood in the urine, but it will show up in a urine test.  Blood in the urine may be serious. It should always be checked by a doctor. Your doctor may recommend more tests, including an X-ray, a CT scan, or a cystoscopy (which lets a doctor look inside the urethra and bladder).  Blood in the urine can be a sign of another problem. Common causes are bladder infections and kidney stones. An injury to your groin or your genital area can also cause bleeding in the urinary tract. Very hard exercise???such as running a marathon???can cause blood in the urine. Blood in the urine can also be a sign of kidney disease or cancer in the bladder or kidney. Many cases of blood in the urine are caused by a harmless condition that runs in families. This is called benign familial hematuria. It does not need any treatment.  Sometimes your urine may look red or brown even though it does not contain blood. For example, not getting enough fluids (dehydration), taking certain medicines, or having a liver problem can change the color of your urine. Eating foods such as beets, rhubarb, or blackberries or foods with red food coloring can make your urine look red or pink.  Follow-up care is a key part of your treatment and safety. Be sure to make and go to all appointments, and call your doctor if you are having problems. It's also a good idea to know your test results and keep a list of the medicines you take.  When should you call for help?  Call your doctor now or seek immediate medical care if:  ?? You have symptoms of a urinary infection. For example:  ?? You have pus in your urine.  ?? You have pain in your back just below your rib cage. This is called flank pain.  ?? You have a fever, chills, or body aches.  ?? It  hurts to urinate.  ?? You have groin or belly pain.  ?? You have more blood in your urine.  Watch closely for changes in your health, and be sure to contact your doctor if:  ?? You have new urination problems.  ?? You do not get better as expected.   Where can you learn more?   Go to http://www.healthwise.net/[facility]  Enter N871 in the search box to learn more about "Blood in the Urine: After Your Visit."   ?? 2006-2014 Healthwise[employer]. Care instructions adapted under license by [facility] (which disclaims liability or warranty for this information). This care instruction is for use with your licensed healthcare professional. If you have questions about a medical condition or this instruction, always ask your healthcare professional. Healthwise, Incorporated disclaims any warranty or liability for your use of this information.  Content Version: 10.1.311062; Current as of: Jun 23, 2011

## 2012-09-12 NOTE — Progress Notes (Signed)
Raymond [name] has a reminder for a "due or due soon" health maintenance. I have asked that he contact his primary care provider for follow-up on this health maintenance.

## 2012-09-30 NOTE — Telephone Encounter (Signed)
Verbal order per DAE B CHOUGH, MD

## 2012-09-30 NOTE — Progress Notes (Signed)
HISTORY OF PRESENT ILLNESS  Raymond [name] is a 67 y.o. male.    HPI  He has been feeling well.  He offers no cardiac complaints. He denies chest pain, dyspnea, orthopnea or PND. He has had no palpitations, dizziness or syncope. He has had no symptoms to indicate TIA or amaurosis fugax. His cholesterol profile has been satisfactory. He is scheduled to have an operation for a corneal nodule and he needs to be off aspirin for approximately two weeks prior to surgery.        He has a history of known coronary artery disease and had an acute inferior wall myocardial infarction on October 12, 2001, and had stenting of the right coronary artery at that time. In January 2006, he started experiencing frequent palpitations and dizziness with a feeling as if he was dropping down on a roller coaster. He was admitted to [facility] on February 27, 2004 with an episode of the sinking feeling and nonsustained ventricular tachycardia. He subsequently underwent cardiac catheterization followed by CABG X3 on March 03, 2004, which consisted of:   1. LIMA to the LAD.   2. Sequential SVG to the diagonal and obtuse marginal branch.   His preoperative EF was in the 45% range. He was treated with amiodarone for nonsustained ventricular tachycardia and has had no recurrence of the sinking feeling or palpitations since the bypass surgery. He developed a large right pleural effusion which was once drained, but he has had some reaccumulation. Because of the sinking feeling, he had a 24-hour Holter monitor which failed to reveal a significant cardiac arrhythmia other than PVCs. He did have the sinking feeling while he was wearing the monitor, but it did not coincide with any significant ventricular arrhythmia.   He had the follow up stress nuclear cardiac imaging on 02/17/2010 which demonstrated fixed defect involving the inferior base with no significant ischemia. Ejection fraction was estimated at 55%.    Review of Systems    Constitutional: Negative for weight loss and malaise/fatigue.   HENT: Negative for hearing loss.    Eyes: Positive for blurred vision. Negative for double vision.   Respiratory: Negative for shortness of breath.    Cardiovascular: Positive for leg swelling. Negative for chest pain, palpitations, orthopnea, claudication and PND.   Gastrointestinal: Negative for heartburn and blood in stool.   Genitourinary: Negative for hematuria.   Musculoskeletal: Negative for back pain and joint pain.   Skin: Negative for itching and rash.   Neurological: Negative for dizziness, loss of consciousness, weakness and headaches.   Psychiatric/Behavioral: Negative for memory loss.       Physical Exam   Constitutional: He is oriented to person, place, and time. He appears well-developed and well-nourished.   HENT:   Head: Normocephalic.   Eyes: Conjunctivae are normal. Pupils are equal, round, and reactive to light.   Neck: Normal range of motion. Neck supple. No JVD present.   Cardiovascular: Regular rhythm, S1 normal and S2 normal.   No extrasystoles are present. Bradycardia present.  PMI is not displaced.  Exam reveals no gallop and no friction rub.    No murmur heard.  Pulses:       Carotid pulses are 3+ on the right side, and 3+ on the left side.  Pulmonary/Chest: Effort normal. He has no rales.   Abdominal: Soft. There is no tenderness.   Musculoskeletal: He exhibits edema.   Trace to 1+   Neurological: He is alert and oriented to person, place,  and time.   Skin: Skin is warm and dry.     BP 140/80   Pulse 44   Ht 5\' 9"  (1.753 m)   Wt 85.276 kg (188 lb)   BMI 27.75 kg/m2   SpO2 98%    Past Medical History   Diagnosis Date   ??? Coronary artery disease      inferior wall myocardial infarction and stenting of the RCA 9/03; Status post CABG X3 in January 2006.   ??? Myocardial infarction, inferior wall 10/12/01     acute   ??? Hypertension    ??? Hypercholesteremia      low HDL   ??? Ventricular tachycardia, nonsustained    ??? 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 Pleural effusion on right    ??? Echocardiogram 11/08/2005     LVE.  EF 45-50%.  Inferior, inferolateral WMA.     ??? S/P cardiac cath 02/29/2004     oRCA 100%.  LM patent.  p/mLAD 85%.  D1 90%.  D2 90%.  CX 45%.  LVEDP 12 mmHg.  EF 45%.  CABG recommended.   ??? Myocardial perfusion 02/22/2010     Fixed defect in inferior base c/w artifact vs prior inferobasal injury. LVE. EF 55%. MIld inferobasal HK noted.   ??? Hypertrophy of prostate with urinary obstruction and other lower urinary tract symptoms (LUTS)    ??? Elevated prostate specific antigen (PSA)    ??? Microscopic hematuria    ??? Hypertension    ??? Right renal cyst    ??? ASHD (arteriosclerotic heart disease)        History     Social History   ??? Marital Status: MARRIED     Spouse Name: N/A     Number of Children: N/A   ??? Years of Education: N/A     Occupational History   ??? Not on file.     Social History Main Topics   ??? Smoking status: Never Smoker    ??? Smokeless tobacco: Never Used   ??? Alcohol Use: Yes      Comment: social   ??? Drug Use: No   ??? Sexually Active:      Other Topics Concern   ??? Not on file     Social History Narrative   ??? No narrative on file       Family History   Problem Relation Age of Onset   ??? Heart Disease Father      x3 heart bypass   ??? Arthritis-osteo Mother    ??? Hypertension Sister        Past Surgical History   Procedure Laterality Date   ??? Stent insertion  10/12/01     right coronary artery   ??? Hx heart catheterization  03/03/04     followed by CABG X3   ??? Hx coronary artery bypass graft  03/03/04     LIMA to the LAD. Sequential SVG to the diagonal and obtuse marginal branch   ??? Hx coronary stent placement  10/12/01     RCA stented using 3.5 x 13 mm Express 2 stent   ??? Hx tonsillectomy         Current Outpatient Prescriptions   Medication Sig Dispense Refill   ??? potassium chloride SR (KLOR-CON 10) 10 mEq tablet Take 1 tablet by mouth daily.  30 tablet  5   ??? simvastatin (ZOCOR) 20 mg tablet Take 1 Tab by mouth nightly.  30 Tab  5   ???  omega-3  acid ethyl esters (LOVAZA) 1 gram capsule Take 1 Cap by mouth every evening.  30 Cap  11   ??? losartan-hydrochlorothiazide (HYZAAR) 100-12.5 mg per tablet Take 1 Tab by mouth daily.  90 Tab  3   ??? amLODIPine (NORVASC) 2.5 mg tablet Take 1 Tab by mouth daily.  90 Tab  3   ??? niacin ER (NIASPAN) 500 mg tablet Take 3 Tabs by mouth nightly.  270 Tab  3   ??? carvedilol (COREG) 12.5 mg tablet Take 1 Tab by mouth two (2) times a day.  180 Tab  3   ??? triamcinolone (NASACORT AQ) 55 mcg nasal inhaler INSTILL 2 SPRAYS INTO EACH NOSTRIL DAILY  17 g  9   ??? aspirin 81 mg tablet Take 81 mg by mouth daily.       ??? MULTIVITAMIN PO Take 1 Tab by mouth daily.           EKG: unchanged from previous tracings, nonspecific ST and T waves changes, sinus bradycardia, RBBB, inf.scar  .  ASSESSMENT and PLAN  Encounter Diagnoses   Name Primary?   ??? CAD, history of inferior wall myocardial infarction/status post stenting of the RCA in September 2003/status post CABG X3 in January 2006/EF 45%. Yes   ??? Hypertension    ??? Nonsustained ventricular tachycardia    ??? Hypercholesterolemia    He has been doing well. He has had no symptoms to indicate angina or cardiac decompensation. He has had no symptoms to indicate recurrent ventricular tachycardia.  He is scheduled to have an operation for a nodule for which there appears to be no contraindication from a cardiac point of view and it should be safe to hold aspirin for two weeks for that period of time.  For now, he will be continued on the current medical regimen.

## 2012-10-31 NOTE — Progress Notes (Signed)
HPI/History  Raymond [name] is a 67 y.o. Caucasian male who presents for med clearance for right eye surgery with Dr. Orson Eva on 10/2.    Pt with hx of ASHD, CAD with hx of MI, HTN, hx of PVC/Vtach, and HLD. Taking hyzaar, norvasc, coreg, k supplement, and ASA. Also on zocor, lovaza, and niacin.  He is followed by Dr. Junius Finner who cleared for upcoming surgery from cardiac standpoint. He denies any cardiopulmonary complaints. No myalgias or GI issues. Very rare flushing from niacin but non-problematic per pt. He has stopped ASA and multivitamin recently per pre-procedure instruction.    He reports he feels great and denies any other complaints. Denies any issues before, during, or after prior surgical procedures.    Patient Active Problem List   Diagnosis Code   ??? Hypertension 402.10   ??? CAD, history of inferior wall myocardial infarction/status post stenting of the RCA in September 2003/status post CABG X3 in January 2006/EF 45%. 414.01   ??? Nonsustained ventricular tachycardia 427.1   ??? Hypercholesteremia 272.0   ??? PVC's 427.69   ??? BPH w urinary obs/LUTS 600.01   ??? Elevated prostate specific antigen (PSA) 790.93   ??? ASHD (arteriosclerotic heart disease) 414.00   ??? Prediabetes 790.29     Past Medical History   Diagnosis Date   ??? Coronary artery disease      inferior wall myocardial infarction and stenting of the RCA 9/03; Status post CABG X3 in January 2006.   ??? Myocardial infarction, inferior wall 10/12/01     acute   ??? Hypertension    ??? Hypercholesteremia      low HDL   ??? Ventricular tachycardia, nonsustained    ??? 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 Pleural effusion on right    ??? Echocardiogram 11/08/2005     LVE.  EF 45-50%.  Inferior, inferolateral WMA.     ??? S/P cardiac cath 02/29/2004     oRCA 100%.  LM patent.  p/mLAD 85%.  D1 90%.  D2 90%.  CX 45%.  LVEDP 12 mmHg.  EF 45%.  CABG recommended.   ??? Myocardial perfusion 02/22/2010     Fixed defect in inferior base c/w artifact vs prior inferobasal injury. LVE. EF 55%. MIld  inferobasal HK noted.   ??? Hypertrophy of prostate with urinary obstruction and other lower urinary tract symptoms (LUTS)    ??? Elevated prostate specific antigen (PSA)    ??? Microscopic hematuria    ??? Hypertension    ??? Right renal cyst    ??? ASHD (arteriosclerotic heart disease)      Past Surgical History   Procedure Laterality Date   ??? Stent insertion  10/12/01     right coronary artery   ??? Hx heart catheterization  03/03/04     followed by CABG X3   ??? Hx coronary artery bypass graft  03/03/04     LIMA to the LAD. Sequential SVG to the diagonal and obtuse marginal branch   ??? Hx coronary stent placement  10/12/01     RCA stented using 3.5 x 13 mm Express 2 stent   ??? Hx tonsillectomy       History     Social History   ??? Marital Status: MARRIED     Spouse Name: N/A     Number of Children: N/A   ??? Years of Education: N/A     Occupational History   ??? Not on file.     Social History Main  Topics   ??? Smoking status: Never Smoker    ??? Smokeless tobacco: Never Used   ??? Alcohol Use: Yes      Comment: social   ??? Drug Use: No   ??? Sexually Active:      Other Topics Concern   ??? Not on file     Social History Narrative   ??? No narrative on file     Family History   Problem Relation Age of Onset   ??? Heart Disease Father      x3 heart bypass   ??? Arthritis-osteo Mother    ??? Hypertension Sister      Current Outpatient Prescriptions   Medication Sig   ??? triamcinolone (NASACORT AQ) 55 mcg nasal inhaler INSTILL 2 SPRAYS INTO EACH NOSTRIL DAILY   ??? potassium chloride SR (KLOR-CON 10) 10 mEq tablet Take 1 tablet by mouth daily.   ??? simvastatin (ZOCOR) 20 mg tablet Take 1 Tab by mouth nightly.   ??? omega-3 acid ethyl esters (LOVAZA) 1 gram capsule Take 1 Cap by mouth every evening.   ??? losartan-hydrochlorothiazide (HYZAAR) 100-12.5 mg per tablet Take 1 Tab by mouth daily.   ??? amLODIPine (NORVASC) 2.5 mg tablet Take 1 Tab by mouth daily.   ??? niacin ER (NIASPAN) 500 mg tablet Take 3 Tabs by mouth nightly.   ??? carvedilol (COREG) 12.5 mg tablet Take 1 Tab  by mouth two (2) times a day.   ??? aspirin 81 mg tablet Stopped for upcoming surgery.   ??? MULTIVITAMIN PO Stopped for upcoming surgery.       Allergies   Allergen Reactions   ??? Ace Inhibitors Unknown (comments)     Intolerance b/c of dry coughing   ??? Bee Sting [Sting, Bee] Hives       Review of Systems  General ??? Denies recent cold/flu-like symptoms or illness. No fevers, chills, rigors, malaise.  ENT ??? Negative.  GI ??? Denies nausea, vomiting, diarrhea, constipation, bowel changes, dark/bloody stools.  Urinary ??? Denies frequency, urgency, dysuria, hematuria.  Rest of complete ROS negative.  Significant findings included in HPI.    Physical Examination  BP 136/62   Pulse 60   Temp(Src) 97.9 ??F (36.6 ??C) (Oral)   Resp 14   Ht 5\' 9"  (1.753 m)   Wt 190 lb (86.183 kg)   BMI 28.05 kg/m2   SpO2 98%    General - Alert, well appearing, and in no acute distress. Pt appears comfortable and in good spirits. Not anxious, non-diaphoretic.  Mental status - Appropriate mood, behavior, speech, dress, motor activity, and thought processes.  Eyes - No erythema or discharge.  Ears - External ears appear normal bilat. Auditory canals and TMs unremarkable.  Nose - Good air movement. No erythema. No rhinorrhea.  Mouth - Mucous membranes moist, pharynx without lesions, erythema, or exudate.  Neck - Supple without rigidity.  Lymph - No periauricular, perimandibular, or cervical tenderness or swelling.  Chest - No tachypnea, retractions, or cyanosis. Good respiratory effort. Clear to auscultation bilat.   Cardiovascular - Normal rate, regular rhythm. No murmurs or gallops noted. No LE edema.  Abdomen - Nondistended. Active bowel sounds. Soft, nontender. No appreciable organomegaly or masses.  Neurological - No focal findings or movement disorder noted    Assessment and Plan  1. ASHD/HTN - ASHD asymptomatic, HTN adequately controlled. Continue current regimen. Dr. Junius Finner has cleared from cardiac standpoint and he will f/u with him as  instructed.  2. HLD - Continue current regimen.  3. Med clearance for right eye surgery - Pt appears medically stable to undergo procedure as planned.       PLEASE NOTE:   This document has been produced using voice recognition software. Unrecognized errors in transcription may be present.    Etta Quill [employer] [facility] [place]  [phone]  10/31/2012

## 2012-11-01 NOTE — Progress Notes (Signed)
I reviewed the patient's medical history, the physician assistant's findings on physical examination, the patient's diagnoses, and treatment plan as documented in the progress note. I concur with the treatment plan as documented. There are no additional recommendations at this time.

## 2012-12-30 NOTE — Telephone Encounter (Signed)
From: [name]  To: [name], MD  Sent: 12/28/2012 1:31 PM EST  Subject: Medication Renewal Request    Original authorizing provider: Greggory Brandy. Jill Poling, MD    [name] would like a refill of the following medications:  carvedilol (COREG) 12.5 mg tablet [Roderick [name], MD]    Preferred pharmacy: [address] - [place], [place] - [address]    Comment:

## 2012-12-30 NOTE — Telephone Encounter (Signed)
Last ov 11/03/12.

## 2013-02-10 NOTE — Telephone Encounter (Signed)
From: Raymond Cookeyavid F Thiam  To: Normand Sloopoderick R Mackinnon, MD  Sent: 02/08/2013 10:41 AM EST  Subject: Medication Renewal Request    Original authorizing provider: Greggory Brandyoderick R. Jill PolingMackinnon, MD    Raymond [name] would like a refill of the following medications:  simvastatin (ZOCOR) 20 mg tablet [Roderick R. Jill PolingMackinnon, MD]    Preferred pharmacy: [address] [place] - [id number]    Comment:

## 2013-02-10 NOTE — Telephone Encounter (Signed)
Last OV 10/31/12

## 2013-03-04 LAB — TSH 3RD GENERATION: TSH: 3.81 u[IU]/mL (ref 0.450–4.500)

## 2013-03-04 LAB — URINALYSIS W/ RFLX MICROSCOPIC
Bilirubin: NEGATIVE
Glucose: NEGATIVE
Ketone: NEGATIVE
Leukocyte Esterase: NEGATIVE
Nitrites: NEGATIVE
Protein: NEGATIVE
Specific Gravity: 1.02
pH (UA): 6.5

## 2013-03-04 LAB — METABOLIC PANEL, COMPREHENSIVE
A-G Ratio: 1.8 CALC (ref 0.6–2.2)
ALT (SGPT): <5 U/L — ABNORMAL LOW (ref 5–30)
AST (SGOT): <7 U/L — ABNORMAL LOW (ref 7–31)
Albumin: 4 g/dL (ref 3.5–5.0)
Alk. phosphatase: 45 U/L — ABNORMAL LOW (ref 53–128)
BUN/Creatinine ratio: 14 CALC (ref 8.0–36.0)
BUN: 16 mg/dL (ref 8–26)
Bilirubin, total: 0.9 mg/dL (ref 0.2–1.3)
CO2: 30.7 mEq/L (ref 24.0–31.0)
Calcium: 9.2 mg/dL (ref 8.5–10.5)
Chloride: 105 mEq/L (ref 101–110)
Creatinine: 1.14 mg/dL (ref 0.70–1.30)
GFR est AA: >60 mL/min/{1.73_m2} (ref >=60)
GFR est non-AA: >60 mL/min/{1.73_m2} (ref >=60)
GLOBULIN, TOTAL: 2.2 g/dL (ref 2.0–4.8)
Glucose: 103 mg/dL — ABNORMAL HIGH (ref 65–99)
Potassium: 3.7 mEq/L (ref 3.5–5.1)
Protein, total: 6.2 g/dL — ABNORMAL LOW (ref 6.4–8.3)
Sodium: 143 mEq/L (ref 135–145)

## 2013-03-04 LAB — LIPID PANEL
Cholesterol, total: 131 mg/dL (ref 100–199)
HDL Cholesterol: 57 mg/dL (ref 40–78)
LDL, calculated: 58 mg/dL (calc) (ref 0–160)
Triglyceride: 81 mg/dL (ref 0–149)
VLDL, calculated: 16 mg/dL (calc)

## 2013-03-04 LAB — CBC WITH AUTOMATED DIFF
ABS. BASOPHILS: 0 10*3/uL (ref 0.0–0.2)
ABS. EOSINOPHILS: 0.2 10*3/uL (ref 0.0–0.4)
ABS. MONOCYTES: 0.5 10*3/uL (ref 0.1–1.0)
ABS. NEUTROPHILS: 3.6 10*3/uL (ref 1.8–7.8)
Abs Lymphocytes: 1.2 10*3/uL (ref 0.7–4.5)
BASOPHILS: 1 % (ref 0–3)
EOSINOPHILS: 3 % (ref 0–7)
HCT: 39 % (ref 37.5–51.0)
HGB: 13.2 g/dL (ref 12.6–17.7)
Lymphocytes: 22 % (ref 14–46)
MCH: 31.8 pg (ref 26.6–33.0)
MCHC: 33.8 g/dL (ref 31.5–35.7)
MCV: 94 fL (ref 79–97)
MONOCYTES: 9 % (ref 4–13)
NEUTROPHILS: 65 % (ref 40–74)
PLATELET: 135 10*3/uL — ABNORMAL LOW (ref 150–379)
RBC: 4.15 x10E6/uL (ref 4.14–5.80)
RDW: 13.1 % (ref 12.3–15.4)
WBC: 5.5 10*3/uL (ref 4.0–10.5)

## 2013-03-04 LAB — CVD REPORT: PDF IMAGE: 0

## 2013-03-04 LAB — MICROSCOPIC EXAMINATION
Bacteria: NONE SEEN
WBC: NEGATIVE

## 2013-03-04 LAB — HEMOGLOBIN A1C WITH EAG: Hemoglobin A1c: 5.9 % — ABNORMAL HIGH (ref 4.8–5.6)

## 2013-03-10 NOTE — Progress Notes (Signed)
Reviewed chart in preparation of office visit and have obtained necessary documentation.

## 2013-03-11 NOTE — Telephone Encounter (Signed)
Pt is req a PSA be done with lab appmnt on 09/09/13, RM

## 2013-03-11 NOTE — Progress Notes (Signed)
Raymond [name], Raymond [name] [birth year], is a 68 y.o. male, who is seen today for a routine physical exam and followup on his atherosclerotic heart disease hyperlipidemia impaired fasting glucose and hypertension. He feels well in general. He is having no chest pain or shortness of breath. He takes all of his medicine regularly. He has had ongoing slight discomfort and slight immobility of his left shoulder ever since he strained it about a year and a half ago doing some lifting. He does have some white mucus production from his nose the last several months despite using Nasacort. He is a very healthy low-fat diet and maintains a good weight.    Past Medical History   Diagnosis Date   ??? Coronary artery disease      inferior wall myocardial infarction and stenting of the RCA 9/03; Status post CABG X3 in January 2006.   ??? Myocardial infarction, inferior wall 10/12/01     acute   ??? Hypertension    ??? Hypercholesteremia      low HDL   ??? Ventricular tachycardia, nonsustained    ??? 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 Pleural effusion on right    ??? Echocardiogram 11/08/2005     LVE.  EF 45-50%.  Inferior, inferolateral WMA.     ??? S/P cardiac cath 02/29/2004     oRCA 100%.  LM patent.  p/mLAD 85%.  D1 90%.  D2 90%.  CX 45%.  LVEDP 12 mmHg.  EF 45%.  CABG recommended.   ??? Myocardial perfusion 02/22/2010     Fixed defect in inferior base c/w artifact vs prior inferobasal injury. LVE. EF 55%. MIld inferobasal HK noted.   ??? Hypertrophy of prostate with urinary obstruction and other lower urinary tract symptoms (LUTS)    ??? Elevated prostate specific antigen (PSA)    ??? Microscopic hematuria    ??? Hypertension    ??? Right renal cyst    ??? ASHD (arteriosclerotic heart disease)      Past Surgical History   Procedure Laterality Date   ??? Stent insertion  10/12/01     right coronary artery   ??? Hx heart catheterization  03/03/04     followed by CABG X3   ??? Hx coronary artery bypass graft  03/03/04     LIMA to the LAD. Sequential SVG to the diagonal and obtuse marginal  branch   ??? Hx coronary stent placement  10/12/01     RCA stented using 3.5 x 13 mm Express 2 stent   ??? Hx tonsillectomy       Current Outpatient Prescriptions   Medication Sig Dispense Refill   ??? simvastatin (ZOCOR) 20 mg tablet Take 1 tablet by mouth nightly.  30 tablet  5   ??? carvedilol (COREG) 12.5 mg tablet Take 1 tablet by mouth two (2) times a day.  180 tablet  3   ??? triamcinolone (NASACORT AQ) 55 mcg nasal inhaler INSTILL 2 SPRAYS INTO EACH NOSTRIL DAILY  17 g  8   ??? potassium chloride SR (KLOR-CON 10) 10 mEq tablet Take 1 tablet by mouth daily.  30 tablet  5   ??? omega-3 acid ethyl esters (LOVAZA) 1 gram capsule Take 1 Cap by mouth every evening.  30 Cap  11   ??? losartan-hydrochlorothiazide (HYZAAR) 100-12.5 mg per tablet Take 1 Tab by mouth daily.  90 Tab  3   ??? amLODIPine (NORVASC) 2.5 mg tablet Take 1 Tab by mouth daily.  90 Tab  3   ???  niacin ER (NIASPAN) 500 mg tablet Take 3 Tabs by mouth nightly.  270 Tab  3   ??? aspirin 81 mg tablet Take 81 mg by mouth daily.       ??? MULTIVITAMIN PO Take 1 Tab by mouth daily.         Allergies   Allergen Reactions   ??? Ace Inhibitors Unknown (comments)     Intolerance b/c of dry coughing   ??? Bee Sting [Sting, Bee] Hives     History     Social History   ??? Marital Status: MARRIED     Spouse Name: N/A     Number of Children: N/A   ??? Years of Education: N/A     Social History Main Topics   ??? Smoking status: Never Smoker    ??? Smokeless tobacco: Never Used   ??? Alcohol Use: Yes      Comment: social   ??? Drug Use: No   ??? Sexually Active:      Other Topics Concern   ??? Not on file     Social History Narrative   ??? No narrative on file     Visit Vitals   Item Reading   ??? BP 138/70   ??? Pulse 58   ??? Temp(Src) 98.4 ??F (36.9 ??C) (Oral)   ??? Resp 14   ??? Ht _0  (1.753 m)   ??? Wt 186 lb (84.369 kg)   ??? BMI 27.45 kg/m2   ??? SpO2 99%     The patient is a well-developed, well-nourished male in no apparent distress. HEENT: Pupils are equal and react to light and extraocular movements are full.  Ear  canals and tympanic membranes appear normal. Oral cavity appears normal with no oral lesions. Neck: Carotids are 2+ without bruits. No adenopathy or thyromegaly. Lungs are clear to percussion. I hear no wheezing, rales or rhonchi. Heart reveals a nondisplaced apical impulse in the fifth interspace at the midclavicular line. Rhythm is regular and no murmur, gallop, click or rub. Abdomen is soft and nontender with no hepatosplenomegaly or masses. No distention or tympany and normoactive bowel sounds. Extremities reveal no clubbing cyanosis or edema. Pulses are 2+ throughout. Normal external genitalia with no hernia. Rectal exam is normal.  The prostate is symmetric and smooth with no nodules.   No suspicious skin growths.    Results for orders placed in visit on 03/03/13   CBC WITH AUTOMATED DIFF       Result Value Range    WBC 5.5  4.0 - 10.5 x10E3/uL    RBC 4.15  4.14 - 5.80 x10E6/uL    HGB 13.2  12.6 - 17.7 g/dL    HCT 39.0  37.5 - 51.0 %    MCV 94  79 - 97 fL    MCH 31.8  26.6 - 33.0 pg    MCHC 33.8  31.5 - 35.7 g/dL    RDW 13.1  12.3 - 15.4 %    PLATELET 135 (*) 150 - 379 x10E3/uL    NEUTROPHILS 65  40 - 74 %    Lymphocytes 22  14 - 46 %    MONOCYTES 9  4 - 13 %    EOSINOPHILS 3  0 - 7 %    BASOPHILS 1  0 - 3 %    ABS. NEUTROPHILS 3.6  1.8 - 7.8 x10E3/uL    Abs Lymphocytes 1.2  0.7 - 4.5 x10E3/uL    ABS. MONOCYTES 0.5  0.1 -  1.0 x10E3/uL    ABS. EOSINOPHILS 0.2  0.0 - 0.4 x10E3/uL    ABS. BASOPHILS 0.0  0.0 - 0.2 x10E3/uL   LIPID PANEL       Result Value Range    Cholesterol, total 131  100 - 199 mg/dL    Triglyceride 81  0 - 149 mg/dL    HDL Cholesterol 57  40 - 78 mg/dL    VLDL, calculated 16      LDL, calculated 58  0 - 160 mg/dL (calc)   HEMOGLOBIN A1C       Result Value Range    Hemoglobin A1c 5.9 (*) 4.8 - 5.6 %   METABOLIC PANEL, COMPREHENSIVE       Result Value Range    Glucose 103 (*) 65 - 99 mg/dL    BUN 16  8 - 26 mg/dL    Creatinine 1.14  0.70 - 1.30 mg/dL    GFR est non-AA >60  >=60 mL/min/1.38m    GFR est AA >60  >=60 mL/min/1.761m  BUN/Creatinine ratio 14.0  8.0 - 36.0 CALC    Sodium 143  135 - 145 mEq/L    Potassium 3.7  3.5 - 5.1 mEq/L    Chloride 105  101 - 110 mEq/L    CO2 30.7  24.0 - 31.0 mEq/L    Calcium 9.2  8.5 - 10.5 mg/dL    Protein, total 6.2 (*) 6.4 - 8.3 g/dL    Albumin 4.0  3.5 - 5.0 g/dL    GLOBULIN, TOTAL 2.2  2.0 - 4.8 g/dL    A-G Ratio 1.8  0.6 - 2.2 CALC    Bilirubin, total 0.9  0.2 - 1.3 mg/dL    Alk. phosphatase 45 (*) 53 - 128 U/L    AST <7 (*) 7 - 31 U/L    ALT <5 (*) 5 - 30 U/L   URINALYSIS W/ RFLX MICROSCOPIC       Result Value Range    Specific Gravity 1.020      pH (UA) 6.5      Color YELLOW      Appearance Clear  Clear    Leukocyte Esterase Negative  Negative    Protein Negative  Negative    Glucose Negative  Negative    Ketone Negative  Negative    Blood See additional order (*) Negative    Bilirubin Negative  Negative    Urobilinogen Comment      Nitrites Negative  Negative   TSH, 3RD GENERATION       Result Value Range    TSH 3.810  0.450 - 4.500 uIU/mL   MICROSCOPIC EXAMINATION       Result Value Range    WBC Negative  Negative    RBC 0-2  Negative    Epithelial cells 0-10  Negative    Bacteria None seen  Negative   CVD REPORT       Result Value Range    INTERPRETATION Note      PDF IMAGE .       Assessment: #1.  Atherosclerotic heart disease asymptomatic since bypass surgery. He will continue current carteolol 12.5 mg twice daily and aspirin 81 mg daily. #2. Hyperlipidemia doing well. He will continue simvastatin 20 mg each evening and niacin 1500 mg at bedtime and prescription fish oil 1 g daily as prescribed by his cardiologist. #3. Hypertension is adequately controlled. He will continue Hyzaar 100/12.5, one daily and amlodipine 2.5 mg daily. #4. Impaired  fasting glucose. This is stable,  he will continue very healthy diet and avoid weight gain. #5. Chronic left shoulder discomfort which is relatively mild. No further treatment or evaluation is indicated.    Colonoscopy  has been recommended several times in the past to him and he prefers not to have that done, discussed with him again today.    He will followup with someone in Dr. Theda Sers' office this fall for his yearly checkup. He'll get a flu shot now.    Followup 6 months with lab

## 2013-03-31 MED ORDER — POTASSIUM CHLORIDE SR 10 MEQ TAB
10 mEq | ORAL_TABLET | Freq: Every day | ORAL | Status: DC
Start: 2013-03-31 — End: 2013-09-29

## 2013-03-31 NOTE — Progress Notes (Signed)
HISTORY OF PRESENT ILLNESS  Raymond CookeyDavid F Mailloux is a 68 y.o. male.    HPI  He has been feeling occasional palpitations and "sunken" feeling in the chest off and on with some dizziness.  This has occurred rather frequently in the past few months but not daily.  He has had no syncope. He denies chest pain, resting dyspnea, orthopnea or PND. He does have mild dyspnea on exertion.  He has had no symptoms to indicate TIA or amaurosis fugax. He does have frequent episodes of mild leg swelling which improves early in the morning.  He does have occasional hot flashes and heartburn from the Niaspan. His cholesterol profile has been very satisfactory, that includes a total cholesterol of 131, triglycerides 81, HDL 57 and LDL 58 on 03/03/13.     He has a history of known coronary artery disease and had an acute inferior wall myocardial infarction on October 12, 2001, and had stenting of the right coronary artery at that time. In January 2006, he started experiencing frequent palpitations and dizziness with a feeling as if he was dropping down on a roller coaster. [facility], [facility] with an episode of the sinking feeling and nonsustained ventricular tachycardia. He subsequently underwent cardiac catheterization followed by CABG X3 on March 03, 2004, which consisted of:   1. LIMA to the LAD.   2. Sequential SVG to the diagonal and obtuse marginal branch.   His preoperative EF was in the 45% range. He was treated with amiodarone for nonsustained ventricular tachycardia and has had no recurrence of the sinking feeling or palpitations since the bypass surgery. He developed a large right pleural effusion which was once drained, but he has had some reaccumulation. Because of the sinking feeling, he had a 24-hour Holter monitor which failed to reveal a significant cardiac arrhythmia other than PVCs. He did have the sinking feeling while he was wearing the monitor, but it did not coincide with  any significant ventricular arrhythmia.   He had the follow up stress nuclear cardiac imaging on 02/17/2010 which demonstrated fixed defect involving the inferior base with no significant ischemia. Ejection fraction was estimated at 55%.    Review of Systems   Constitutional: Positive for diaphoresis. Negative for weight loss and malaise/fatigue.   HENT: Negative for hearing loss.    Eyes: Negative for blurred vision and double vision.   Respiratory: Negative for shortness of breath.    Cardiovascular: Positive for palpitations and leg swelling. Negative for chest pain, orthopnea, claudication and PND.   Gastrointestinal: Positive for heartburn. Negative for blood in stool.   Genitourinary: Negative for hematuria.   Musculoskeletal: Negative for back pain and joint pain.   Skin: Negative for itching and rash.   Neurological: Positive for dizziness. Negative for loss of consciousness, weakness and headaches.   Psychiatric/Behavioral: Negative for memory loss.       Physical Exam   Constitutional: He is oriented to person, place, and time. He appears well-developed and well-nourished.   HENT:   Head: Normocephalic.   Eyes: Conjunctivae are normal. Pupils are equal, round, and reactive to light.   Neck: Normal range of motion. Neck supple. No JVD present.   Cardiovascular: Regular rhythm, S1 normal and S2 normal.   No extrasystoles are present. Bradycardia present.  PMI is not displaced.  Exam reveals no gallop.    No murmur heard.  Pulses:       Carotid pulses are 3+ on the  right side, and 3+ on the left side.  Pulmonary/Chest: Effort normal. He has no rales.   Abdominal: Soft. There is no tenderness.   Musculoskeletal: He exhibits edema.   Trace to 1+   Neurological: He is alert and oriented to person, place, and time.   Skin: Skin is warm and dry.     BP 128/80    Pulse 51    Ht 5\' 9"  (1.753 m)    Wt 83.462 kg (184 lb)    BMI 27.16 kg/m2      SpO2 98%     Past Medical History   Diagnosis Date   ??? Coronary artery  disease      inferior wall myocardial infarction and stenting of the RCA 9/03; Status post CABG X3 in January 2006.   ??? Myocardial infarction, inferior wall 10/12/01     acute   ??? Hypertension    ??? Hypercholesteremia      low HDL   ??? Ventricular tachycardia, nonsustained    ??? 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 Pleural effusion on right    ??? Echocardiogram 11/08/2005     LVE.  EF 45-50%.  Inferior, inferolateral WMA.     ??? S/P cardiac cath 02/29/2004     oRCA 100%.  LM patent.  p/mLAD 85%.  D1 90%.  D2 90%.  CX 45%.  LVEDP 12 mmHg.  EF 45%.  CABG recommended.   ??? Myocardial perfusion 02/22/2010     Fixed defect in inferior base c/w artifact vs prior inferobasal injury. LVE. EF 55%. MIld inferobasal HK noted.   ??? Hypertrophy of prostate with urinary obstruction and other lower urinary tract symptoms (LUTS)    ??? Elevated prostate specific antigen (PSA)    ??? Microscopic hematuria    ??? Hypertension    ??? Right renal cyst    ??? ASHD (arteriosclerotic heart disease)        History     Social History   ??? Marital Status: MARRIED     Spouse Name: N/A     Number of Children: N/A   ??? Years of Education: N/A     Occupational History   ??? Not on file.     Social History Main Topics   ??? Smoking status: Never Smoker    ??? Smokeless tobacco: Never Used   ??? Alcohol Use: Yes      Comment: social   ??? Drug Use: No   ??? Sexual Activity: Not on file     Other Topics Concern   ??? Not on file     Social History Narrative       Family History   Problem Relation Age of Onset   ??? Heart Disease Father      x3 heart bypass   ??? Arthritis-osteo Mother    ??? Hypertension Sister        Past Surgical History   Procedure Laterality Date   ??? Stent insertion  10/12/01     right coronary artery   ??? Hx heart catheterization  03/03/04     followed by CABG X3   ??? Hx coronary artery bypass graft  03/03/04     LIMA to the LAD. Sequential SVG to the diagonal and obtuse marginal branch   ??? Hx coronary stent placement  10/12/01     RCA stented using 3.5 x 13 mm Express 2 stent   ??? Hx  tonsillectomy         Current Outpatient Prescriptions  Medication Sig Dispense Refill   ??? simvastatin (ZOCOR) 20 mg tablet Take 1 tablet by mouth nightly.  30 tablet  5   ??? carvedilol (COREG) 12.5 mg tablet Take 1 tablet by mouth two (2) times a day.  180 tablet  3   ??? triamcinolone (NASACORT AQ) 55 mcg nasal inhaler INSTILL 2 SPRAYS INTO EACH NOSTRIL DAILY  17 g  8   ??? potassium chloride SR (KLOR-CON 10) 10 mEq tablet Take 1 tablet by mouth daily.  30 tablet  5   ??? omega-3 acid ethyl esters (LOVAZA) 1 gram capsule Take 1 Cap by mouth every evening.  30 Cap  11   ??? losartan-hydrochlorothiazide (HYZAAR) 100-12.5 mg per tablet Take 1 Tab by mouth daily.  90 Tab  3   ??? amLODIPine (NORVASC) 2.5 mg tablet Take 1 Tab by mouth daily.  90 Tab  3   ??? niacin ER (NIASPAN) 500 mg tablet Take 3 Tabs by mouth nightly.  270 Tab  3   ??? aspirin 81 mg tablet Take 81 mg by mouth daily.       ??? MULTIVITAMIN PO Take 1 Tab by mouth daily.           EKG: unchanged from previous tracings, sinus bradycardia, RBBB, inf.scar  .  ASSESSMENT and PLAN  Encounter Diagnoses   Name Primary?   ??? CAD, history of inferior wall myocardial infarction/status post stenting of the RCA in September 2003/status post CABG X3 in January 2006/EF 45%. Yes   ??? Hypertension    ??? Nonsustained ventricular tachycardia    ??? Hypercholesterolemia    He has had recurrent "sunken feeling" in the past 2-3 months.  He used to have these feelings in the past and once was diagnosed with nonsustained ventricular tachycardia and subsequently underwent coronary bypass grafting in 2006.  He did have recurrent episodes of similar feelings after but Holter monitor failed to demonstrate any significant cardiac arrhythmia other than some PVCs. He once had these feeling of sunken or sinking but it did not coincide with any significant ventricular arrhythmia.  He has mild dyspnea on exertion which he did not complain of in the past. At this point I would proceed with follow up stress  nuclear cardiac imaging. If he continues with the palpitations or sunken feelings, I would consider further evaluation with implantable loop recorder.  His leg swelling seems to be related to amlodipine but not severe enough to discontinue medication. We discussed Niaspan and he was advised that thus far there has been no outcome benefit by large scale studies.  Since his cholesterol profile has been very satisfactory, we will continue the current regimen including the Niaspan but if his hot flashes or heartburn are more bothersome we would then consider discontinuing Niaspan and changing Simvastatin to Crestor.

## 2013-03-31 NOTE — Progress Notes (Signed)
1. Have you been to the ER, urgent care clinic since your last visit?  Hospitalized since your last visit? Yes, eye surgery Oct. 2014  2. Have you seen or consulted any other health care providers outside of the [facility] since your last visit?  Include any pap smears or colon screening. No

## 2013-04-11 MED ADMIN — 0.9% sodium chloride infusion: INTRAVENOUS | @ 15:00:00 | NDC 00409798437

## 2013-04-11 MED ADMIN — sodium chloride (NS) flush 10 mL: INTRAVENOUS | @ 13:00:00 | NDC 87701099893

## 2013-04-11 MED FILL — SODIUM CHLORIDE 0.9 % IV: INTRAVENOUS | Qty: 1000

## 2013-04-11 MED FILL — BD POSIFLUSH NORMAL SALINE 0.9 % INJECTION SYRINGE: INTRAMUSCULAR | Qty: 10

## 2013-04-11 NOTE — Progress Notes (Signed)
Patient was injected with 11.0 millicuries 99mTc Sestamibi on 04/11/13 at 0820.    Patient was injected with 33.0 millicuries 99mTc Sestamibi on 3/6/15at 0950.    Patient's armbands were removed and placed in shred-it box.

## 2013-04-11 NOTE — Progress Notes (Signed)
Quick Note:    Let me look at it on Monday.  ______

## 2013-04-11 NOTE — Procedures (Signed)
[facility]                            [facility]                               [address]                          [place], [place] [address]                          NUCLEAR MEDICINE CARDIAC STRESS    PATIENT:    Raymond [name], Raymond [name]  MRN:        [id number]  CSN::       [id number]  DATE:       04/11/2013  LOCATION:  REFERRING:  [name], MD  DICTATING:  Marketta Valadez, DO      CARDIOLITE STRESS STUDY    ORDERING PHYSICIAN: Dae Been Chough, MD    REASON FOR STUDY: Chronic coronary artery disease.    OTHER PHYSICIAN: Roderick MacKinnon, MD    ELECTROCARDIOGRAM RESTING: Sinus bradycardia, rate of 55. Right bundle  branch block with left anterior fascicular block, and initial Q-waves  inferolaterally to suggest past inferolateral wall myocardial infarction.    PROCEDURE: The patient received 11 mCi of Cardiolite intravenously and had  a resting myocardial perfusion study completed. He subsequently underwent  stress testing using the standard Bruce protocol. Total exercise time was 6  minutes 0 seconds. Achieved heart rate was 134, 88% of predicted maximum.    REASON FOR TERMINATION: Fatigue and dyspnea.    ABNORMALITY SYMPTOMS: No chest pain.  ST CHANGES: None.  BLOOD PRESSURE RESPONSE: Normal.  ARRHYTHMIAS: Occasional PVCs with exercise.    IMPRESSION: Negative maximal stress test.    The patient received 33 mCi of Cardiolite intravenously and had a stress  myocardial perfusion study completed and compared to the resting study.    FINDINGS: There appeared to be a severe fixed perfusion defect in the basal  inferior wall, as well as a moderately severe, partially transient  perfusion defect in the mid inferior, and proximal and mid inferolateral  walls, the latter showing both mild infarction and mild ischemia in the  inferolateral walls. The remainder of the myocardium appeared to be  adequately perfused. Gated SPECT imaging was unable to be read due  to  gating error, and for further assessment of overall left ventricular  function, another modality such as an echocardiogram is recommended.    SUMMARY  1. Abnormal stress Cardiolite myocardial perfusion study.  2. Severe fixed perfusion defect in the basal inferior wall with a  moderately severe, partially transient perfusion defect in the mid  inferior, and proximal and mid inferolateral walls, consistent with mild to  moderate infarction with mild to moderate peri-infarction ischemia in these  areas with the remainder of the myocardium adequately perfused.  3. Gating error caused the gated SPECT portion of the study to be  unreadable and for assessment of left ventricular function, another  modality such as echocardiogram is recommended.  4. Negative maximal stress test electrocardiographically.  5. In comparison to the report of nuclear myocardial perfusion study of  02/22/2010, the fixed defect in the inferior wall at rest, as well as  during stress, which appeared to be related to   diaphragmatic attenuation  versus inferior basilar injury, seems to have become more expanded with the  inferolateral wall now involved showing mild inferolateral wall scar  with mild to moderate inferolateral wall ischemia.  6. This was an intermediate risk stress Cardiolite myocardial perfusion  study.                       Caryl Fate, DO      ES:wmx  D: 04/11/2013  CScript:04/11/2013 02:12 P  CQDocID #: [id number]   CScriptDoc #:  [id number]   cc:   DAE B. CHOUGH, MD         [name], MD         Pollie Poma, DO

## 2013-04-11 NOTE — Procedures (Signed)
[facility]                            [facility]                               [address]                          [place], [place] [id number]                          NUCLEAR MEDICINE CARDIAC STRESS    PATIENT:    Raymond CookeySMITH, Raymond [name]:        [id number]  CSN::       [id number]  DATE:       04/11/2013  LOCATION:  REFERRING:  Normand SloopODERICK R MACKINNON, MD  DICTATING:  Trina AoEDWARD Bryse Blanchette, DO      CARDIOLITE STRESS STUDY    ORDERING PHYSICIAN: Dae Shelia MediaBeen Chough, MD    REASON FOR STUDY: Chronic coronary artery disease.    OTHER PHYSICIAN: Arnell Asaloderick MacKinnon, MD    ELECTROCARDIOGRAM RESTING: Sinus bradycardia, rate of 55. Right bundle  branch block with left anterior fascicular block, and initial Q-waves  inferolaterally to suggest past inferolateral wall myocardial infarction.    PROCEDURE: The patient received 11 mCi of Cardiolite intravenously and had  a resting myocardial perfusion study completed. He subsequently underwent  stress testing using the standard Bruce protocol. Total exercise time was 6  minutes 0 seconds. Achieved heart rate was 134, 88% of predicted maximum.    REASON FOR TERMINATION: Fatigue and dyspnea.    ABNORMALITY SYMPTOMS: No chest pain.  ST CHANGES: None.  BLOOD PRESSURE RESPONSE: Normal.  ARRHYTHMIAS: Occasional PVCs with exercise.    IMPRESSION: Negative maximal stress test.    The patient received 33 mCi of Cardiolite intravenously and had a stress  myocardial perfusion study completed and compared to the resting study.    FINDINGS: There appeared to be a severe fixed perfusion defect in the basal  inferior wall, as well as a moderately severe, partially transient  perfusion defect in the mid inferior, and proximal and mid inferolateral  walls, the latter showing both mild infarction and mild ischemia in the  inferolateral walls. The remainder of the myocardium appeared to be  adequately perfused. Gated SPECT imaging was unable to be read due  to  gating error, and for further assessment of overall left ventricular  function, another modality such as an echocardiogram is recommended.    SUMMARY  1. Abnormal stress Cardiolite myocardial perfusion study.  2. Severe fixed perfusion defect in the basal inferior wall with a  moderately severe, partially transient perfusion defect in the mid  inferior, and proximal and mid inferolateral walls, consistent with mild to  moderate infarction with mild to moderate peri-infarction ischemia in these  areas with the remainder of the myocardium adequately perfused.  3. Gating error caused the gated SPECT portion of the study to be  unreadable and for assessment of left ventricular function, another  modality such as echocardiogram is recommended.  4. Negative maximal stress test electrocardiographically.  5. In comparison to the report of nuclear myocardial perfusion study of  02/22/2010, the fixed defect in the inferior wall at rest, as well as  during stress, which appeared to be related to  diaphragmatic attenuation  versus inferior basilar injury, seems to have become more expanded with the  inferolateral wall now involved showing mild inferolateral wall scar  with mild to moderate inferolateral wall ischemia.  6. This was an intermediate risk stress Cardiolite myocardial perfusion  study.                       Trina Ao, DO      ES:wmx  D: 04/11/2013  CScript:04/11/2013 02:12 P  CQDocID #: [id number]   CScriptDoc #:  [id number]   cc:   DAE B. Junius Finner, MD         [name], MD         Trina Ao, DO

## 2013-04-14 NOTE — Progress Notes (Signed)
Quick Note:    Let me look at it today.  ______

## 2013-04-14 NOTE — Progress Notes (Signed)
Quick Note:    Let him know it's ok.  ______

## 2013-04-15 NOTE — Telephone Encounter (Signed)
Niacin D/C per Dr Garwin Brothersae Chough - patient aware -     Verbal order and read back per DAE B CHOUGH, MD

## 2013-04-15 NOTE — Telephone Encounter (Signed)
-----   Message from Edrick Ohae B Chough, MD sent at 04/14/2013  5:27 PM EDT -----  Let him know it's ok.

## 2013-04-15 NOTE — Telephone Encounter (Signed)
Spoke with patient about stress test results.  He also wanted to ask Dr Junius Finnerhough about possibly stopping his Niacin - he thinks this may be related to some of his "heart-burn/indigestion" he may be experiencing - he can be reached at [phone] until 12:30 pm - please advise - thank you -

## 2013-04-15 NOTE — Telephone Encounter (Signed)
LMOM for patient

## 2013-04-15 NOTE — Addendum Note (Signed)
Addended by: Kasandra KnudsenGOODE, Eline Geng M on: 04/15/2013 01:01 PM     Modules accepted: Orders, Medications

## 2013-06-23 MED ORDER — LOSARTAN-HYDROCHLOROTHIAZIDE 100 MG-12.5 MG TAB
ORAL_TABLET | ORAL | Status: DC
Start: 2013-06-23 — End: 2014-04-29

## 2013-06-23 MED ORDER — AMLODIPINE 2.5 MG TAB
2.5 mg | ORAL_TABLET | ORAL | Status: DC
Start: 2013-06-23 — End: 2014-03-23

## 2013-08-05 NOTE — Progress Notes (Signed)
Manon HildingDavid F Ek,born May 23, 1945, is a 68 y.o. male, who is seen today for swelling of the right leg and also some right lumbar pain. [place] in [place] and has been home for a week. After 2 weeks in [place] with long flights he started noticing a lot of swelling in his whole lower right leg and into the ankle and foot. He has been elevating his leg and he thinks from elevated so high he does develop some pain in the right lumbar area without radiation down the leg. He had patient and his left leg in the past with sciatica and he calls his current pain sciatica as well. He has not had any real leg pain just the swelling. He has had no dyspnea or pleuritic discomfort with breathing. No chills sweats fever. He is taking all of his usual medicines.    Past Medical History   Diagnosis Date   ??? Coronary artery disease      inferior wall myocardial infarction and stenting of the RCA 9/03; Status post CABG X3 in January 2006.   ??? 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 Hypertension    ??? Hypercholesteremia      low HDL   ??? Ventricular tachycardia, nonsustained (HCC)    ??? 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 Pleural effusion on right    ??? Echocardiogram 11/08/2005     LVE.  EF 45-50%.  Inferior, inferolateral WMA.     ??? S/P cardiac cath 02/29/2004     oRCA 100%.  LM patent.  p/mLAD 85%.  D1 90%.  D2 90%.  CX 45%.  LVEDP 12 mmHg.  EF 45%.  CABG recommended.   ??? Myocardial perfusion 02/22/2010     Fixed defect in inferior base c/w artifact vs prior inferobasal injury. LVE. EF 55%. MIld inferobasal HK noted.   ??? Hypertrophy of prostate with urinary obstruction and other lower urinary tract symptoms (LUTS)    ??? Elevated prostate specific antigen (PSA)    ??? Microscopic hematuria    ??? Hypertension    ??? Right renal cyst    ??? ASHD (arteriosclerotic heart disease)      Current Outpatient Prescriptions   Medication Sig Dispense Refill   ??? amLODIPine (NORVASC) 2.5 mg tablet TAKE ONE TABLET BY MOUTH ONE TIME  DAILY 90 Tab 2   ??? losartan-hydrochlorothiazide (HYZAAR) 100-12.5 mg per tablet TAKE ONE TABLET BY MOUTH ONE TIME DAILY 90 Tab 2   ??? potassium chloride SR (KLOR-CON 10) 10 mEq tablet Take 1 Tab by mouth daily. 30 Tab 5   ??? simvastatin (ZOCOR) 20 mg tablet Take 1 tablet by mouth nightly. 30 tablet 5   ??? carvedilol (COREG) 12.5 mg tablet Take 1 tablet by mouth two (2) times a day. 180 tablet 3   ??? triamcinolone (NASACORT AQ) 55 mcg nasal inhaler INSTILL 2 SPRAYS INTO EACH NOSTRIL DAILY 17 g 8   ??? omega-3 acid ethyl esters (LOVAZA) 1 gram capsule Take 1 Cap by mouth every evening. 30 Cap 11   ??? aspirin 81 mg tablet Take 81 mg by mouth daily.     ??? MULTIVITAMIN PO Take 1 Tab by mouth daily.       Visit Vitals   Item Reading   ??? BP 134/80 mmHg   ??? Pulse 52   ??? Temp 98.1 ??F (36.7 ??C)   ??? Resp 14   ??? Ht 5\' 9"  (1.753 m)   ???  Wt 184 lb (83.462 kg)   ??? BMI 27.16 kg/m2     Lungs are clear percussion. No wheezing crackles and no rub anywhere. Heart reveals a regular rhythm with normal S1 and S2 and no murmur gallop click or rub. The left lower extremity reveals no edema. On the right there is 2+ edema in the foot and ankle and into the calf up to the knee. There is no calf tenderness or thigh tenderness medially or inguinal tenderness and no inguinal adenopathy. Homans sign is absent. Pulses are not felt through the edema in right ankle. There is no tenderness in the lumbar spine and normal straight leg raising. Measurements of the right calf about 12 cm below the tibial plateau is 43 cm and on the left is 37 cm at that level.    Assessment: New onset substantial edema in the right lower leg highly suggestive of DVT particularly in the setting of long plane travels. We'll obtain venous Dopplers as soon as possible and if positive for DVT he started on Xarelto, that eventuality was discussed with the patient and his wife at some length now because of the high probability clinically of  DVT causing this problem.  For at least the next week I've recommended that he do only essential activities and no cutting the grass and no longer stores, just up and around with normal household activities. This    Followup to be arranged    Abdomen: Doppler study showed no evidence of DVT. We'll have the patient continued to elevate his leg when he is sitting but may be up and around as much as he wishes. Clinically he certainly seemed to have DVT there is no obvious reason for this swelling at this point. If it does not go down in the coming week and a half will need further evaluation.  Followup is going to be planned for late next week, or sooner if there is any worsening.

## 2013-08-05 NOTE — Telephone Encounter (Signed)
Vein and vascular from next door called, the patient is negative for a DVT in the right leg

## 2013-08-05 NOTE — Telephone Encounter (Signed)
Please notify the patient that his vascular study shows no evidence of blood clot in the right leg. I would like him to continue to elevate his leg anytime he is sitting down or he can get up and around as much as he wants to during the day including cutting the grass. I would like to evaluate his leg it next week, he needs an office visit please

## 2013-08-05 NOTE — Telephone Encounter (Signed)
LMTC to give results below and schedule ov for next week.

## 2013-08-06 MED ORDER — SIMVASTATIN 20 MG TAB
20 mg | ORAL_TABLET | Freq: Every evening | ORAL | Status: DC
Start: 2013-08-06 — End: 2014-02-09

## 2013-08-06 NOTE — Telephone Encounter (Signed)
Dr. Jill PolingMackinnon there aren't any slots available for next week what day could we possible schedule pt?  Pt prefer a late morning, but willing to take anytime you may have.

## 2013-08-06 NOTE — Telephone Encounter (Signed)
Scheduled.

## 2013-08-06 NOTE — Telephone Encounter (Signed)
From: Raymond Cookeyavid F Cowles  To: Normand Sloopoderick R Mackinnon, MD  Sent: 08/06/2013 11:41 AM EDT  Subject:  Medication Renewal Request    Original  authorizing provider: Greggory Brandyoderick R. Jill PolingMackinnon, MD    Raymond Cookeyavid  F [name] would like a refill of the following medications:  simvastatin  (ZOCOR) 20 mg tablet [Roderick R. Jill PolingMackinnon, MD]    Preferred  pharmacy: [address] [place] - [id number]    Comment:

## 2013-08-06 NOTE — Telephone Encounter (Signed)
Last OV 08/05/13

## 2013-08-06 NOTE — Procedures (Signed)
[facility]  *** FINAL REPORT ***    Name: Raymond [name], Raymond [name]  MRN: [id number]    Outpatient  DOB: 24 Jul 1945  HIS Order #: [id number]  TRAKnet Visit #: [id number]  Date: 05 Aug 2013    TYPE OF TEST: Peripheral Venous Testing    REASON FOR TEST  Limb swelling    Right Leg:-  Deep venous thrombosis:           No  Superficial venous thrombosis:    No  Deep venous insufficiency:        Not examined  Superficial venous insufficiency: Not examined      INTERPRETATION/FINDINGS  Right leg :  1. Deep vein(s) visualized include the common femoral, proximal  femoral, mid femoral, distal femoral, popliteal(above knee),  popliteal(fossa), popliteal(below knee), posterior tibial and peroneal   veins.  2. No evidence of deep venous thrombosis detected in the veins  visualized.  3. No evidence of deep vein thrombosis in the contralateral common  femoral vein.  4. Superficial vein(s) visualized include the great saphenous vein.  5. No evidence of superficial thrombosis detected.    ADDITIONAL COMMENTS    I have personally reviewed the data relevant to the interpretation of  this  study.    TECHNOLOGIST: Emmanuel Bathelemy, BHSC, RVT/  Signed: 08/05/2013 04:25 PM    PHYSICIAN: Saad Buhl A. Corryn Madewell,  Signed: 08/06/2013 09:39 AM

## 2013-08-06 NOTE — Procedures (Signed)
[facility]  *** FINAL REPORT ***    Name: Raymond [name], Raymond [name]  MRN: [id number]    Outpatient  DOB: 24 Jul 1945  HIS Order #: [id number]  TRAKnet Visit #: [id number]  Date: 05 Aug 2013    TYPE OF TEST: Peripheral Venous Testing    REASON FOR TEST  Limb swelling    Right Leg:-  Deep venous thrombosis:           No  Superficial venous thrombosis:    No  Deep venous insufficiency:        Not examined  Superficial venous insufficiency: Not examined      INTERPRETATION/FINDINGS  Right leg :  1. Deep vein(s) visualized include the common femoral, proximal  femoral, mid femoral, distal femoral, popliteal(above knee),  popliteal(fossa), popliteal(below knee), posterior tibial and peroneal   veins.  2. No evidence of deep venous thrombosis detected in the veins  visualized.  3. No evidence of deep vein thrombosis in the contralateral common  femoral vein.  4. Superficial vein(s) visualized include the great saphenous vein.  5. No evidence of superficial thrombosis detected.    ADDITIONAL COMMENTS    I have personally reviewed the data relevant to the interpretation of  this  study.    TECHNOLOGIST: Bess KindsEmmanuel Bathelemy, [facility], RVT/  Signed: 08/05/2013 04:25 PM    PHYSICIAN: Liz BeachMarc A. Lorine Bearsamacho,  Signed: 08/06/2013 09:39 AM

## 2013-08-11 NOTE — Patient Instructions (Signed)
Advance Directives: After Your Visit  Your Care Instructions  An advance directive is a legal way to state your wishes at the end of your life. It tells your family and your doctor what to do if you can no longer say what you want.  There are two main types of advance directives. You can change them any time that your wishes change.  ?? A living will tells your family and your doctor your wishes about life support and other treatment.  ?? A medical power of attorney lets you name a person to make treatment decisions for you when you can't speak for yourself. This person is called a health care agent.  If you do not have an advance directive, decisions about your medical care may be made by a doctor or a judge who doesn't know you.  It may help to think of an advance directive as a gift to the people who care for you. If you have one, they won't have to make tough decisions by themselves.  Follow-up care is a key part of your treatment and safety. Be sure to make and go to all appointments, and call your doctor if you are having problems. It's also a good idea to know your test results and keep a list of the medicines you take.  How can you care for yourself at home?  ?? Discuss your wishes with your loved ones and your doctor. This way, there are no surprises.  ?? Many states have a unique form. Or you might use a universal form that has been approved by many states. This kind of form can sometimes be completed and stored online. Your electronic copy will then be available wherever you have a connection to the Internet. In most cases, doctors will respect your wishes even if you have a form from a different state.  ?? You don't need a lawyer to do an advance directive. But you may want to get legal advice.  ?? Think about these questions when you prepare an advance directive:  ?? Who do you want to make decisions about your medical care if you are not able to? Many people choose a family member, close friend, or doctor.   ?? Do you know enough about life support methods that might be used? If not, talk to your doctor so you understand.  ?? What are you most afraid of that might happen? You might be afraid of having pain, losing your independence, or being kept alive by machines.  ?? Where would you prefer to die? Choices include your home, a hospital, or a nursing home.  ?? Would you like to have information about hospice care to support you and your family?  ?? Do you want to donate organs when you die?  ?? Do you want certain religious practices performed before you die? If so, put your wishes in the advance directive.  ?? Read your advance directive every year, and make changes as needed.  When should you call for help?  Be sure to contact your doctor if you have any questions.   Where can you learn more?   Go to http://www.healthwise.net/[facility]  Enter R264 in the search box to learn more about "Advance Directives: After Your Visit."   ?? 2006-2015 Healthwise, Incorporated. Care instructions adapted under license by [employer] (which disclaims liability or warranty for this information). This care instruction is for use with your licensed healthcare professional. If you have questions about a medical condition or this   instruction, always ask your healthcare professional. Healthwise, Incorporated disclaims any warranty or liability for your use of this information.  Content Version: 10.5.422740; Current as of: March 28, 2013

## 2013-08-11 NOTE — Progress Notes (Signed)
1. Have you been to the ER, urgent care clinic or hospitalized since your last visit? NO.     2. Have you seen or consulted any other health care providers outside of the [facility] since your last visit (Include any pap smears or colon screening)? NO      Do you have an Advanced Directive? NO    Would you like information on Advanced Directives? YES

## 2013-08-11 NOTE — Progress Notes (Signed)
Raymond HildingDavid F Ingman,born October 29, 1945, is a 68 y.o. male, who is seen today for Reevaluation of new-onset right lower extremity edema. The swelling began about 3 weeks ago and he was evaluated last week with Doppler studies which showed no evidence of DVT anywhere in the right leg. It is the swelling is a little less pronounced in the calf but maybe a little more in the foot since I saw him 7 days ago. Is having no pain in the leg. No dyspnea or pleuritic or other chest pain. No lightheadedness. No chills sweats fever. No abdominal symptoms. He has been elevating his leg as I recommended, without apparent benefit.    Past Medical History   Diagnosis Date   ??? Coronary artery disease      inferior wall myocardial infarction and stenting of the RCA 9/03; Status post CABG X3 in January 2006.   ??? 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 Hypertension    ??? Hypercholesteremia      low HDL   ??? Ventricular tachycardia, nonsustained (HCC)    ??? 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 Pleural effusion on right    ??? Echocardiogram 11/08/2005     LVE.  EF 45-50%.  Inferior, inferolateral WMA.     ??? S/P cardiac cath 02/29/2004     oRCA 100%.  LM patent.  p/mLAD 85%.  D1 90%.  D2 90%.  CX 45%.  LVEDP 12 mmHg.  EF 45%.  CABG recommended.   ??? Myocardial perfusion 02/22/2010     Fixed defect in inferior base c/w artifact vs prior inferobasal injury. LVE. EF 55%. MIld inferobasal HK noted.   ??? Hypertrophy of prostate with urinary obstruction and other lower urinary tract symptoms (LUTS)    ??? Elevated prostate specific antigen (PSA)    ??? Microscopic hematuria    ??? Hypertension    ??? Right renal cyst    ??? ASHD (arteriosclerotic heart disease)      Current Outpatient Prescriptions   Medication Sig Dispense Refill   ??? simvastatin (ZOCOR) 20 mg tablet Take 1 Tab by mouth nightly. 30 Tab 5   ??? amLODIPine (NORVASC) 2.5 mg tablet TAKE ONE TABLET BY MOUTH ONE TIME DAILY 90 Tab 2    ??? losartan-hydrochlorothiazide (HYZAAR) 100-12.5 mg per tablet TAKE ONE TABLET BY MOUTH ONE TIME DAILY 90 Tab 2   ??? potassium chloride SR (KLOR-CON 10) 10 mEq tablet Take 1 Tab by mouth daily. 30 Tab 5   ??? carvedilol (COREG) 12.5 mg tablet Take 1 tablet by mouth two (2) times a day. 180 tablet 3   ??? triamcinolone (NASACORT AQ) 55 mcg nasal inhaler INSTILL 2 SPRAYS INTO EACH NOSTRIL DAILY 17 g 8   ??? omega-3 acid ethyl esters (LOVAZA) 1 gram capsule Take 1 Cap by mouth every evening. 30 Cap 11   ??? aspirin 81 mg tablet Take 81 mg by mouth daily.     ??? MULTIVITAMIN PO Take 1 Tab by mouth daily.       Visit Vitals   Item Reading   ??? BP 138/74 mmHg   ??? Pulse 44   ??? Temp(Src) 97.7 ??F (36.5 ??C) (Oral)   ??? Resp 14   ??? Ht 5\' 9"  (1.753 m)   ??? Wt 178 lb (80.74 kg)   ??? BMI 26.27 kg/m2   ??? SpO2 99%     No JVD or HJR. Lungs are clear to percussion. Good breath sounds with no  wheezing or crackles. Heart reveals a regular rhythm with occasional extrasystole. No murmur gallop click or rub. Apical impulse is not palpable. Abdomen is soft and nontender with no hepatosplenomegaly or masses no bruits. No adenopathy anywhere. It is still 2-3+ edema in the lower leg and 1-2+ pedal edema. Pulses are intact.  Hohmann sign is absent. No calf tenderness.    EKG shows sinus bradycardia at 43 permanent, right bundle branch block and old inferior infarction.    Assessment: #1. Continued marked edema now for 3 weeks in the right lower extremity with negative noninvasive venous studies. We'll have him evaluated by vascular surgery soon as possible, CT may be necessary though with swelling just in the one leg that seems not to be the source of the problem. No adenopathy in the right inguinal region. #2. Bradycardia has worsened. We'll decrease her atenolol to 12.5 mg, one half tablet twice daily. His cardiologist previously decreased from 25-12.5 twice daily because of the bradycardia which is even more pronounced now.  #3. ASHD  status post previous inferior infarction and bypass surgery, asymptomatic.    Followup as previously planned or sooner if needed.

## 2013-08-12 NOTE — Progress Notes (Signed)
[name]    Chief Complaint   Patient presents with   ??? New Patient   ??? Swelling     right lower leg       History and Physical    Mr. Ratay is a very pleasant 68 year old here for evaluation of new right leg swelling. He's had DVT studies that are negative. His no inciting trauma, bug bite, or any event me is onset this leg edema. He was traveling in [place] time that it started. It's not painful but visibly swollen and heavy feeling. He is a nonsmoker, history of hypertension and cardiac bypass and stent. Nondiabetic. Compliant with his given medications. No shortness of breath or chest pain. Good healthcare maintenance. He tells me the swelling goes down overnight but not completely to match the opposite leg which has no swelling    Past Medical History   Diagnosis Date   ??? Coronary artery disease      inferior wall myocardial infarction and stenting of the RCA 9/03; Status post CABG X3 in January 2006.   ??? 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 Hypertension    ??? Hypercholesteremia      low HDL   ??? Ventricular tachycardia, nonsustained (HCC)    ??? 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 Pleural effusion on right    ??? Echocardiogram 11/08/2005     LVE.  EF 45-50%.  Inferior, inferolateral WMA.     ??? S/P cardiac cath 02/29/2004     oRCA 100%.  LM patent.  p/mLAD 85%.  D1 90%.  D2 90%.  CX 45%.  LVEDP 12 mmHg.  EF 45%.  CABG recommended.   ??? Myocardial perfusion 02/22/2010     Fixed defect in inferior base c/w artifact vs prior inferobasal injury. LVE. EF 55%. MIld inferobasal HK noted.   ??? Hypertrophy of prostate with urinary obstruction and other lower urinary tract symptoms (LUTS)    ??? Elevated prostate specific antigen (PSA)    ??? Microscopic hematuria    ??? Hypertension    ??? Right renal cyst    ??? ASHD (arteriosclerotic heart disease)      Patient Active Problem List   Diagnosis Code   ??? Hypertension 402.10   ??? CAD, history of inferior wall myocardial infarction/status post stenting  of the RCA in September 2003/status post CABG X3 in January 2006/EF 45%. 414.01   ??? Nonsustained ventricular tachycardia (HCC) 427.1   ??? Hypercholesteremia 272.0   ??? PVC's 427.69   ??? BPH w urinary obs/LUTS 600.01   ??? Elevated prostate specific antigen (PSA) 790.93   ??? ASHD (arteriosclerotic heart disease) 414.00   ??? Prediabetes 790.29   ??? Leg edema, right 782.3     Past Surgical History   Procedure Laterality Date   ??? Stent insertion  10/12/01     right coronary artery   ??? Hx heart catheterization  03/03/04     followed by CABG X3   ??? Hx coronary artery bypass graft  03/03/04     LIMA to the LAD. Sequential SVG to the diagonal and obtuse marginal branch   ??? Hx coronary stent placement  10/12/01     RCA stented using 3.5 x 13 mm Express 2 stent   ??? Hx tonsillectomy       Current Outpatient Prescriptions   Medication Sig Dispense Refill   ??? simvastatin (ZOCOR) 20 mg tablet Take 1 Tab by mouth nightly. 30 Tab  5   ??? amLODIPine (NORVASC) 2.5 mg tablet TAKE ONE TABLET BY MOUTH ONE TIME DAILY 90 Tab 2   ??? losartan-hydrochlorothiazide (HYZAAR) 100-12.5 mg per tablet TAKE ONE TABLET BY MOUTH ONE TIME DAILY 90 Tab 2   ??? potassium chloride SR (KLOR-CON 10) 10 mEq tablet Take 1 Tab by mouth daily. 30 Tab 5   ??? carvedilol (COREG) 12.5 mg tablet Take 1 tablet by mouth two (2) times a day. (Patient taking differently: Take 6.25 mg by mouth two (2) times a day.) 180 tablet 3   ??? triamcinolone (NASACORT AQ) 55 mcg nasal inhaler INSTILL 2 SPRAYS INTO EACH NOSTRIL DAILY 17 g 8   ??? omega-3 acid ethyl esters (LOVAZA) 1 gram capsule Take 1 Cap by mouth every evening. 30 Cap 11   ??? aspirin 81 mg tablet Take 81 mg by mouth daily.     ??? MULTIVITAMIN PO Take 1 Tab by mouth daily.       Allergies   Allergen Reactions   ??? Ace Inhibitors Unknown (comments)     Intolerance b/c of dry coughing   ??? Bee Sting [Sting, Bee] Hives     History     Social History   ??? Marital Status: MARRIED     Spouse Name: N/A     Number of Children: N/A    ??? Years of Education: N/A     Occupational History   ??? Not on file.     Social History Main Topics   ??? Smoking status: Never Smoker    ??? Smokeless tobacco: Never Used   ??? Alcohol Use: Yes      Comment: social   ??? Drug Use: No   ??? Sexual Activity: Not on file     Other Topics Concern   ??? Not on file     Social History Narrative      Family History   Problem Relation Age of Onset   ??? Heart Disease Father      x3 heart bypass   ??? Arthritis-osteo Mother    ??? Hypertension Sister        Review of Systems    No history of seizure or stroke, no diabetes or hyperthyroid, no shortness of breath or chest pain, history of CABG and coronary stent and hypertension, no constipation or diarrhea, no renal failure, no blood in his urine, no history of DVT, no history of known cancers, no chronic skin conditions, no anxiety or depression, no musculoskeletal deficit.     Physical Exam:    BP 130/68 mmHg   Pulse 58   Resp 18   Ht 5\' 9"  (1.753 m)   Wt 178 lb (80.74 kg)   BMI 26.27 kg/m2   Healthy-appearing 68  Head is atraumatic, scleral clear, visual fields intact pupils intact, hearing and speech intact  Neck no JVD no carotid bruit  Chest clear  Cardiac regular without murmur  Abdomen soft no pulsatile mass I don't feel a mass  Extremities with good range of motion and strength  Peripheral pulses palpable no arterial deficit appreciated  Obvious edema of the right leg from the knee to the foot with blue discoloration of the foot nonischemic    Impression and Plan:    ICD-9-CM    1. Leg edema, right 782.3 DUPLEX LOWER EXT VENOUS RIGHT AMB     CTA ABDOMEN PELV W CONT   we'll send for reflux study to evaluate for venous reflux right leg  We'll send for CAT  scan to look for mass effect centrally  Followup after test  Orders Placed This Encounter   ??? DUPLEX LOWER EXT VENOUS RIGHT AMB (Reflux)   ??? CTA ABDOMEN PELV W CONT       Follow-up Disposition:  Return in about 2 weeks (around 08/26/2013).    [place] Odella Aquas, MD    PLEASE NOTE:   This document has been produced using voice recognition software. Unrecognized errors in transcription may be present.

## 2013-08-19 LAB — CREATININE, POC
Creatinine, POC: 1.3 MG/DL (ref 0.6–1.3)
GFRAA, POC: >60 mL/min/{1.73_m2} (ref >60)
GFRNA, POC: 55 mL/min/{1.73_m2} — ABNORMAL LOW (ref >60)

## 2013-08-19 MED ORDER — IOPAMIDOL 76 % IV SOLN
370 mg iodine /mL (76 %) | Freq: Once | INTRAVENOUS | Status: AC
Start: 2013-08-19 — End: 2013-08-19

## 2013-08-19 MED FILL — ISOVUE-370  76 % INTRAVENOUS SOLUTION: 370 mg iodine /mL (76 %) | INTRAVENOUS | Qty: 100

## 2013-08-20 MED ORDER — IOPAMIDOL 76 % IV SOLN
370 mg iodine /mL (76 %) | Freq: Once | INTRAVENOUS | Status: AC
Start: 2013-08-20 — End: 2013-08-19
  Administered 2013-08-19: 20:00:00 via INTRAVENOUS

## 2013-08-20 MED FILL — ISOVUE-370  76 % INTRAVENOUS SOLUTION: 370 mg iodine /mL (76 %) | INTRAVENOUS | Qty: 100

## 2013-08-25 NOTE — Telephone Encounter (Signed)
Pt called stating Dr. Jill Poling spoke with Dr. Ronne Binning about the results of Raymond [name] CTA and he needs a biopsy. Please call him with instructions.

## 2013-08-25 NOTE — Telephone Encounter (Signed)
PT had AaCT scan and got results. He has not heard back from Dr Ronne BinningMcKenzie. Please advise.   PT# [phone]

## 2013-08-25 NOTE — Telephone Encounter (Signed)
PT wants to speak with you- I gave him your message.  #[phone]

## 2013-08-26 ENCOUNTER — Encounter

## 2013-08-26 MED ORDER — OMEGA-3 ACID ETHYL ESTERS 1 GRAM CAP
1 gram | ORAL_CAPSULE | Freq: Every evening | ORAL | Status: DC
Start: 2013-08-26 — End: 2014-09-04

## 2013-08-26 NOTE — Telephone Encounter (Signed)
Last OV 08/11/13

## 2013-08-26 NOTE — Telephone Encounter (Signed)
Per Dr. Ronne Binning , after reviewing CT scan of the pt called and spoke with Dr. Jill Poling and discussed the need for the patient to have CT guided biopsy by IR.  Called patient and made him aware of this and that someone would be calling him to arrange the appointment. Pt verbalized understanding.     Called IR department and spoke with Natalia Leatherwood in IR and will fax the patients information for her to set up biopsy for the patient.

## 2013-08-26 NOTE — Telephone Encounter (Signed)
From: Raymond Cookeyavid F Placide  To: Normand Sloopoderick R Mackinnon, MD  Sent: 08/26/2013 2:01 PM EDT  Subject:  Medication Renewal Request    Original  authorizing provider: Greggory Brandyoderick R. Jill PolingMackinnon, MD    Raymond Cookeyavid  F [name] would like a refill of the following medications:  omega-3  acid ethyl esters (LOVAZA) 1 gram capsule [Roderick R. Jill PolingMackinnon, MD]    [facility] [place] - [phone]    Comment:

## 2013-09-04 LAB — CBC W/O DIFF
HCT: 39.3 % (ref 36.0–48.0)
HGB: 13.2 g/dL (ref 13.0–16.0)
MCH: 30.7 PG (ref 24.0–34.0)
MCHC: 33.6 g/dL (ref 31.0–37.0)
MCV: 91.4 FL (ref 74.0–97.0)
MPV: 10.8 FL (ref 9.2–11.8)
PLATELET: 158 10*3/uL (ref 135–420)
RBC: 4.3 M/uL — ABNORMAL LOW (ref 4.70–5.50)
RDW: 13.3 % (ref 11.6–14.5)
WBC: 5 10*3/uL (ref 4.6–13.2)

## 2013-09-04 LAB — METABOLIC PANEL, BASIC
Anion gap: 6 mmol/L (ref 3.0–18)
BUN/Creatinine ratio: 16 (ref 12–20)
BUN: 20 MG/DL — ABNORMAL HIGH (ref 7.0–18)
CO2: 29 mmol/L (ref 21–32)
Calcium: 8.9 MG/DL (ref 8.5–10.1)
Chloride: 106 mmol/L (ref 100–108)
Creatinine: 1.27 MG/DL (ref 0.6–1.3)
GFR est AA: >60 mL/min/{1.73_m2} (ref >60)
GFR est non-AA: 60 mL/min/{1.73_m2} — ABNORMAL LOW (ref >60)
Glucose: 110 mg/dL — ABNORMAL HIGH (ref 74–99)
Potassium: 3.7 mmol/L (ref 3.5–5.5)
Sodium: 141 mmol/L (ref 136–145)

## 2013-09-04 LAB — PROTHROMBIN TIME + INR
INR: 1 (ref 0.8–1.2)
Prothrombin time: 13.3 s (ref 11.5–15.2)

## 2013-09-04 LAB — PTT: aPTT: 26 s (ref 24.6–37.7)

## 2013-09-04 MED ORDER — NALOXONE 0.4 MG/ML INJECTION
0.4 mg/mL | INTRAMUSCULAR | Status: DC | PRN
Start: 2013-09-04 — End: 2013-09-04

## 2013-09-04 MED ORDER — MIDAZOLAM 1 MG/ML IJ SOLN
1 mg/mL | INTRAMUSCULAR | Status: DC | PRN
Start: 2013-09-04 — End: 2013-09-04
  Administered 2013-09-04 (×2): via INTRAVENOUS

## 2013-09-04 MED ORDER — FLUMAZENIL 0.1 MG/ML IV SOLN
0.1 mg/mL | INTRAVENOUS | Status: DC | PRN
Start: 2013-09-04 — End: 2013-09-04

## 2013-09-04 MED ORDER — FENTANYL CITRATE (PF) 50 MCG/ML IJ SOLN
50 mcg/mL | INTRAMUSCULAR | Status: DC | PRN
Start: 2013-09-04 — End: 2013-09-04
  Administered 2013-09-04 (×2): via INTRAVENOUS

## 2013-09-04 MED ADMIN — 0.9% sodium chloride infusion: INTRAVENOUS | @ 11:00:00 | NDC 00409798309

## 2013-09-04 MED FILL — MIDAZOLAM 1 MG/ML IJ SOLN: 1 mg/mL | INTRAMUSCULAR | Qty: 2

## 2013-09-04 MED FILL — FLUMAZENIL 0.1 MG/ML IV SOLN: 0.1 mg/mL | INTRAVENOUS | Qty: 2

## 2013-09-04 MED FILL — FENTANYL (PF) 100 MCG/2 ML (50 MCG/ML) INTRAVENOUS SYRINGE: 100 mcg/2 mL (50 mcg/mL) | INTRAVENOUS | Qty: 2

## 2013-09-04 MED FILL — NALOXONE 0.4 MG/ML INJECTION: 0.4 mg/mL | INTRAMUSCULAR | Qty: 0.25

## 2013-09-04 NOTE — Other (Signed)
No changes in abdomen. Dressing to right lower abd remains dry and intact. No swelling at procedural site.

## 2013-09-04 NOTE — Other (Signed)
Dressing to right lower abd clean, dry and intact. Right side of abd mildly larger than left. Patient and wife indicate that they are not sure if it was swollen at home. Call made to radiologist for comparison question.

## 2013-09-04 NOTE — Other (Signed)
Dr. Karle Plumber reviews CT report and is okay with discharge to home.

## 2013-09-04 NOTE — Procedures (Signed)
INTERVENTIONAL RADIOLOGY POST PROCEDURE NOTE     September 04, 2013       8:52 AM     History: Raymond [name] is a 68 y.o. male with history of right lower extremity swelling, found to have a large pelvic mass.    Procedure: Pelvic mass biopsy     Preoperative diagnosis: Pelvic mass    Postoperative diagnosis: Same    Procedure details: Informed consent obtained. Patient prepared/draped in usual sterile fashion. Procedure verification performed. Local anesthesia and/or moderate sedation. Core biopsies obtained with imaging guidance. Hemostasis achieved. Sterile dressing applied.    Access: Percutaneous right lower abdominal quadrant    Findings: Successful pelvic mass biopsy    Operator: Elige Radon, MD    Assistant(s): None    Medication(s): Local anesthesia; moderate sedation    Contrast: None    Specimen: Yes    Estimated blood loss: Minimal    Blood administered: None    Drain(s): None    Implant(s): None    Complication(s): None    Condition: Stable    Disposition: Continue previous    Elige Radon, MD

## 2013-09-04 NOTE — Other (Signed)
Patient armband removed and shredded

## 2013-09-04 NOTE — Other (Signed)
Patient returns from CT in no acute distress.

## 2013-09-04 NOTE — Other (Signed)
Dr. Karle Plumber made aware of asymmetric abdomen with right side being larger than left. Patient states this is where his tumor is but does not remember abd being the size it is now. There is no discoloration but right abd is firm and non-tender to touch. Orders received.

## 2013-09-04 NOTE — Other (Signed)
TRANSFER - OUT REPORT:    Verbal report given to  [facility] on Raymond Cookeyavid F Delfino  being transferred toADU(unit) for routine post - op       Report consisted of patient???s Situation, Background, Assessment and   Recommendations(SBAR).     Information from the following report(s) SBAR, Kardex and MAR was reviewed with the receiving nurse.    Lines:   Peripheral IV 08/19/13 Left Antecubital (Active)       Peripheral IV 09/04/13 Right Hand (Active)   Site Assessment Clean, dry, & intact;Clean;Dry;Intact 09/04/2013  6:00 AM   Phlebitis Assessment 0 09/04/2013  6:00 AM   Infiltration Assessment 0 09/04/2013  6:00 AM   Dressing Status Clean, dry, & intact;Clean;Dry;Intact 09/04/2013  6:00 AM   Dressing Type Tape;Transparent 09/04/2013  6:00 AM   Hub Color/Line Status Pink;Infusing 09/04/2013  6:00 AM        Opportunity for questions and clarification was provided.      Patient transported with:   [employer]

## 2013-09-04 NOTE — Other (Signed)
Patient tolerated PO intake well. No changes in abd size.

## 2013-09-04 NOTE — H&P (Signed)
INTERVENTIONAL RADIOLOGY OUTPATIENT HISTORY & PHYSICAL     September 04, 2013       8:51 AM     Indication/symptoms: Raymond CookeyDavid F [name] is a 68 y.o. male who presents for biopsy of pelvic mass suspected to be obstructing right lower extremity venous outflow. The medical record and relevant imaging were reviewed. Expected benefits, potential risks, and alternatives to the procedure were discussed with the patient (and/or surrogate decision maker, as applicable) and all questions were answered. Informed consent was obtained.    Current medications:    Current Facility-Administered Medications   Medication Dose Route Frequency Provider Last Rate Last Dose   ??? 0.9% sodium chloride infusion  20 mL/hr IntraVENous CONTINUOUS Delia Headymitri E Samoilov, MD 20 mL/hr at 09/04/13 0707 20 mL/hr at 09/04/13 0707   ??? fentaNYL citrate (PF) injection 50 mcg  50 mcg IntraVENous Rad Multiple Elige RadonGabriel M Lashawn Orrego, MD       ??? midazolam (VERSED) injection 1 mg  1 mg IntraVENous Rad Multiple Elige RadonGabriel M Shantanu Strauch, MD       ??? naloxone (NARCAN) injection 0.1 mg  0.1 mg IntraVENous Rad Multiple Elige RadonGabriel M Charels Stambaugh, MD       ??? flumazenil (ROMAZICON) 0.1 mg/mL injection 0.2 mg  0.2 mg IntraVENous Rad Multiple Elige RadonGabriel M Armine Rizzolo, MD           Allergies:  Allergies   Allergen Reactions   ??? Ace Inhibitors Unknown (comments)     Intolerance b/c of dry coughing   ??? Bee Sting [Sting, Bee] Hives       Past medical history:  Past Medical History   Diagnosis Date   ??? Coronary artery disease      inferior wall myocardial infarction and stenting of the RCA 9/03; Status post CABG X3 in January 2006.   ??? 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 Hypertension    ??? Hypercholesteremia      low HDL   ??? Ventricular tachycardia, nonsustained (HCC)    ??? 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 Pleural effusion on right    ??? Echocardiogram 11/08/2005      LVE.  EF 45-50%.  Inferior, inferolateral WMA.     ??? S/P cardiac cath 02/29/2004     oRCA 100%.  LM patent.  p/mLAD 85%.  D1 90%.  D2 90%.  CX 45%.  LVEDP 12 mmHg.  EF 45%.  CABG recommended.   ??? Myocardial perfusion 02/22/2010     Fixed defect in inferior base c/w artifact vs prior inferobasal injury. LVE. EF 55%. MIld inferobasal HK noted.   ??? Hypertrophy of prostate with urinary obstruction and other lower urinary tract symptoms (LUTS)    ??? Elevated prostate specific antigen (PSA)    ??? Microscopic hematuria    ??? Hypertension    ??? Right renal cyst    ??? ASHD (arteriosclerotic heart disease)        Past surgical history:  Past Surgical History   Procedure Laterality Date   ??? Stent insertion  10/12/01     right coronary artery   ??? Hx heart catheterization  03/03/04     followed by CABG X3   ??? Hx coronary artery bypass graft  03/03/04     LIMA to the LAD. Sequential SVG to the diagonal and obtuse marginal branch   ??? Hx coronary stent placement  10/12/01     RCA stented using 3.5 x 13 mm Express 2 stent   ???  Hx tonsillectomy         Data:    Vital signs:   BP 151/88 mmHg   Pulse 45   Temp(Src) 98.1 ??F (36.7 ??C)   Resp 18   Wt 182 lb 6 oz   SpO2 97%:     Laboratory data:   Metabolic panel:  Lab Results   Component Value Date/Time    SODIUM 141 09/04/2013 06:55 AM    POTASSIUM 3.7 09/04/2013 06:55 AM    CHLORIDE 106 09/04/2013 06:55 AM    CO2 29 09/04/2013 06:55 AM    BUN 20 09/04/2013 06:55 AM    CREATININE 1.27 09/04/2013 06:55 AM    GLUCOSE 110 09/04/2013 06:55 AM     Complete blood count:  Lab Results   Component Value Date/Time    WBC 5.0 09/04/2013 06:55 AM    HGB 13.2 09/04/2013 06:55 AM    HCT 39.3 09/04/2013 06:55 AM    PLATELET 158 09/04/2013 06:55 AM     Coagulation parameters:  Lab Results   Component Value Date/Time    PROTHROMBIN TIME 13.3 09/04/2013 06:55 AM    INR 1.0 09/04/2013 06:55 AM    APTT 26.0 09/04/2013 06:55 AM          Physical examination:   General: Alert and oriented; no acute distress    Heart:     Regular rate/rhythm   Lungs:   Clear to ausculation bilaterally    The patient is an appropriate candidate to undergo the procedure and, if necessary, to receive moderate sedation/analgesia.    Elige Radon, MD

## 2013-09-04 NOTE — Procedures (Signed)
Procedures  by Elige RadonWerder, Gabriel M, MD at 09/04/13 [phone]                Author: Elige RadonWerder, Gabriel M, MD  Service: RADIOLOGY  Author Type: Physician       Filed: 09/04/13 1023  Date of Service: 09/04/13 0852  Status: Signed          Editor: Karle PlumberWerder, Jorge MandrilGabriel M, MD (Physician)                                                                                                              INTERVENTIONAL RADIOLOGY POST PROCEDURE NOTE          September 04, 2013       8:52 AM        History: Dorena CookeyDavid F Fiorito is a 68 y.o. male w ith history of right lower extremity swelling, found to have a large pelvic mass.      Procedure: Pelvic mass biopsy       Preoperative diagnosis: Pelvic mass      Postoperative diagnosis: Same      Procedure details: Informed consent obtained. Patient prepared/draped  in usual sterile fashion. Procedure verification performed. Local anesthesia and/or moderate sedation. Core biopsies obtained  with imaging guidance. Hemostasis achieved. Sterile dressing applied.      Access:  Percutaneous right lower abdominal quadrant      Findings: Successful pelvic mass biopsy      Operator: Elige RadonGabriel M Werder, MD      Assistant(s): None      Medication(s): Local anesthesia ; moderate sedation      Contrast:  None      Specimen: Yes      Estimated blood loss: Minimal      Blood administered: None      Drain(s): None      Implant(s): None      Complication(s): None      Condition: Stable      Disposition: Continue previous      Elige RadonGabriel M Werder, MD

## 2013-09-10 LAB — LIPID PANEL
Cholesterol, total: 147 mg/dL (ref 100–199)
HDL Cholesterol: 49 mg/dL (ref 40–78)
LDL, calculated: 79 mg/dL (calc) (ref 0–160)
Triglyceride: 95 mg/dL (ref 0–149)
VLDL, calculated: 19 mg/dL (calc)

## 2013-09-10 LAB — METABOLIC PANEL, COMPREHENSIVE
A-G Ratio: 1.7 CALC (ref 0.6–2.2)
ALT (SGPT): 11 U/L (ref 5–30)
AST (SGOT): 9 U/L (ref 7–31)
Albumin: 4.1 g/dL (ref 3.5–5.0)
Alk. phosphatase: 44 U/L — ABNORMAL LOW (ref 53–128)
BUN/Creatinine ratio: 12.8 CALC (ref 8.0–36.0)
BUN: 17 mg/dL (ref 8–26)
Bilirubin, total: 0.9 mg/dL (ref 0.2–1.3)
CO2: 31.5 mEq/L — ABNORMAL HIGH (ref 24.0–31.0)
Calcium: 9.4 mg/dL (ref 8.5–10.5)
Chloride: 104 mEq/L (ref 101–110)
Creatinine: 1.33 mg/dL — ABNORMAL HIGH (ref 0.70–1.30)
GFR est AA: >60 mL/min/{1.73_m2} (ref >=60)
GFR est non-AA: 53 mL/min/{1.73_m2} — ABNORMAL LOW (ref >=60)
GLOBULIN, TOTAL: 2.4 g/dL (ref 2.0–4.8)
Glucose: 107 mg/dL — ABNORMAL HIGH (ref 65–99)
Potassium: 4.4 mEq/L (ref 3.5–5.1)
Protein, total: 6.5 g/dL (ref 6.4–8.3)
Sodium: 141 mEq/L (ref 135–145)

## 2013-09-10 LAB — CVD REPORT

## 2013-09-10 LAB — PSA, DIAGNOSTIC (PROSTATE SPECIFIC AG): Prostate Specific Ag: 3.3 ng/mL (ref 0.0–4.0)

## 2013-09-11 NOTE — Progress Notes (Signed)
Raymond [name] is here for follow up on his right leg edema  It became noticeable after a trip to [place]  He had the pvl study to r/o dvt  CT scan was ordered showing a mass that was compressing the iliac vein, therefore able to explain his swelling  This was recommended for biopsy which IR was able to do  The report shows BENIGN NEURAL TUMOR, FAVOR NEUROFIBROMA.  -NEGATIVE FOR MALIGNANCY    He was very happy to hear these results  However, he still has the edema and it would be reasonable to consider excision of this tumor  Persistent compression could allow continued edema and may put him at risk for thrombus  Will discuss with Dr Mckenzie/general surgery for this consideration  I will call him with further recommendations and arrangements after I can confer with the surgeons

## 2013-09-11 NOTE — Progress Notes (Signed)
Reviewed and agree with PA note.

## 2013-09-12 LAB — AMB POC URINALYSIS DIP STICK AUTO W/O MICRO
Bilirubin (UA POC): NEGATIVE
Glucose (UA POC): NEGATIVE
Ketones (UA POC): NEGATIVE
Leukocyte esterase (UA POC): NEGATIVE
Nitrites (UA POC): NEGATIVE
Protein (UA POC): NEGATIVE mg/dL
Specific gravity (UA POC): 1.02 (ref 1.001–1.035)
Urobilinogen (UA POC): 1 (ref 0.2–1)
pH (UA POC): 6 (ref 4.6–8.0)

## 2013-09-12 NOTE — Progress Notes (Signed)
Mr. Huhta has a reminder for a "due or due soon" health maintenance. I have asked that he contact his primary care provider for follow-up on this health maintenance.

## 2013-09-12 NOTE — Patient Instructions (Signed)
Blood in the Urine: After Your Visit  Your Care Instructions  Blood in the urine, or hematuria, may make the urine look red, brown, or pink. There may be blood every time you urinate or just from time to time. You cannot always see blood in the urine, but it will show up in a urine test.  Blood in the urine may be serious. It should always be checked by a doctor. Your doctor may recommend more tests, including an X-ray, a CT scan, or a cystoscopy (which lets a doctor look inside the urethra and bladder).  Blood in the urine can be a sign of another problem. Common causes are bladder infections and kidney stones. An injury to your groin or your genital area can also cause bleeding in the urinary tract. Very hard exercise???such as running a marathon???can cause blood in the urine. Blood in the urine can also be a sign of kidney disease or cancer in the bladder or kidney. Many cases of blood in the urine are caused by a harmless condition that runs in families. This is called benign familial hematuria. It does not need any treatment.  Sometimes your urine may look red or brown even though it does not contain blood. For example, not getting enough fluids (dehydration), taking certain medicines, or having a liver problem can change the color of your urine. Eating foods such as beets, rhubarb, or blackberries or foods with red food coloring can make your urine look red or pink.  Follow-up care is a key part of your treatment and safety. Be sure to make and go to all appointments, and call your doctor if you are having problems. It's also a good idea to know your test results and keep a list of the medicines you take.  When should you call for help?  Call your doctor now or seek immediate medical care if:  ?? You have symptoms of a urinary infection. For example:  ?? You have pus in your urine.  ?? You have pain in your back just below your rib cage. This is called flank pain.  ?? You have a fever, chills, or body aches.   ?? It hurts to urinate.  ?? You have groin or belly pain.  ?? You have more blood in your urine.  Watch closely for changes in your health, and be sure to contact your doctor if:  ?? You have new urination problems.  ?? You do not get better as expected.   Where can you learn more?   Go to http://www.healthwise.net/[facility]  Enter N871 in the search box to learn more about "Blood in the Urine: After Your Visit."   ?? 2006-2015 [employer]. Care instructions adapted under license by [place] (which disclaims liability or warranty for this information). This care instruction is for use with your licensed healthcare professional. If you have questions about a medical condition or this instruction, always ask your healthcare professional. Healthwise, Incorporated disclaims any warranty or liability for your use of this information.  Content Version: 10.5.422740; Current as of: October 15, 2012

## 2013-09-12 NOTE — Progress Notes (Signed)
Chief Complaint   Patient presents with   ??? Microscopic Hematuria   ??? Nocturia     x 1-2       HISTORY OF PRESENT ILLNESS:  Raymond [name] is a 68 y.o. male who presents today in followup for BPH and a mildly elevated PSA history. His current PSA is normal at 3.3 back in February. He doesn't have any urinary symptoms and is voiding comfortably with nocturia 0-1. No urinary infections and no episodes of retention. He currently is being evaluated for an abdominal mass that is benign in nature and has caused some right leg venous obstruction. He also has a history of microhematuria but has not had any gross hematuria to speak of. He is not on any anticoagulants at this time.      ROSreviewed from previous charts. The major concern is the abdominal mass which will require surgery in the near future. He has not had any further cardiac issues since 2006.    Past Medical History   Diagnosis Date   ??? Coronary artery disease      inferior wall myocardial infarction and stenting of the RCA 9/03; Status post CABG X3 in January 2006.   ??? 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 Hypertension    ??? Hypercholesteremia      low HDL   ??? Ventricular tachycardia, nonsustained (HCC)    ??? 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 Pleural effusion on right    ??? Echocardiogram 11/08/2005     LVE.  EF 45-50%.  Inferior, inferolateral WMA.     ??? S/P cardiac cath 02/29/2004     oRCA 100%.  LM patent.  p/mLAD 85%.  D1 90%.  D2 90%.  CX 45%.  LVEDP 12 mmHg.  EF 45%.  CABG recommended.   ??? Myocardial perfusion 02/22/2010     Fixed defect in inferior base c/w artifact vs prior inferobasal injury. LVE. EF 55%. MIld inferobasal HK noted.   ??? Hypertrophy of prostate with urinary obstruction and other lower urinary tract symptoms (LUTS)    ??? Elevated prostate specific antigen (PSA)    ??? Microscopic hematuria    ??? Hypertension    ??? Right renal cyst    ??? ASHD (arteriosclerotic heart disease)        Past Surgical History    Procedure Laterality Date   ??? Stent insertion  10/12/01     right coronary artery   ??? Hx heart catheterization  03/03/04     followed by CABG X3   ??? Hx coronary artery bypass graft  03/03/04     LIMA to the LAD. Sequential SVG to the diagonal and obtuse marginal branch   ??? Hx coronary stent placement  10/12/01     RCA stented using 3.5 x 13 mm Express 2 stent   ??? Hx tonsillectomy         History   Substance Use Topics   ??? Smoking status: Never Smoker    ??? Smokeless tobacco: Never Used   ??? Alcohol Use: Yes      Comment: social       Allergies   Allergen Reactions   ??? Ace Inhibitors Unknown (comments)     Intolerance b/c of dry coughing   ??? Bee Sting [Sting, Bee] Hives       Family History   Problem Relation Age of Onset   ??? Heart Disease Father      x3  heart bypass   ??? Arthritis-osteo Mother    ??? Hypertension Sister        Current Outpatient Prescriptions   Medication Sig Dispense Refill   ??? omega-3 acid ethyl esters (LOVAZA) 1 gram capsule Take 1 Cap by mouth every evening. 30 Cap 11   ??? simvastatin (ZOCOR) 20 mg tablet Take 1 Tab by mouth nightly. 30 Tab 5   ??? amLODIPine (NORVASC) 2.5 mg tablet TAKE ONE TABLET BY MOUTH ONE TIME DAILY 90 Tab 2   ??? losartan-hydrochlorothiazide (HYZAAR) 100-12.5 mg per tablet TAKE ONE TABLET BY MOUTH ONE TIME DAILY 90 Tab 2   ??? potassium chloride SR (KLOR-CON 10) 10 mEq tablet Take 1 Tab by mouth daily. 30 Tab 5   ??? carvedilol (COREG) 12.5 mg tablet Take 1 tablet by mouth two (2) times a day. (Patient taking differently: Take 6.25 mg by mouth two (2) times a day.) 180 tablet 3   ??? triamcinolone (NASACORT AQ) 55 mcg nasal inhaler INSTILL 2 SPRAYS INTO EACH NOSTRIL DAILY 17 g 8   ??? aspirin 81 mg tablet Take 81 mg by mouth daily.     ??? MULTIVITAMIN PO Take 1 Tab by mouth daily.           PHYSICAL EXAMINATION:   BP 120/70 mmHg   Pulse 59   Ht 5\' 9"  (1.753 m)   Wt 185 lb (83.915 kg)   BMI 27.31 kg/m2   SpO2 99%  Constitutional: WDWN, Pleasant and appropriate affect, No acute distress.     CV:  No peripheral swelling noted  Respiratory: No respiratory distress or difficulties  Abdomen:  No abdominal masses or tenderness. No CVA tenderness. No inguinal hernias noted.   GU Male:    HYQ:MVHQIONGRE:Perineum normal to visual inspection, no erythema or irritation, Sphincter with good tone, Rectum with no hemorrhoids, fissures or masses, Prostate smooth, symmetric and anodular. Prostate is about 40 g in size, flat firm and nonnodular.  SCROTUM:  No scrotal rash or lesions noticed.  Normal bilateral testes and epididymis.   PENIS: Urethral meatus normal in location and size. No urethral discharge.  Skin: No jaundice.    Neuro/Psych:  Alert and oriented x 3, affect appropriate.   Lymphatic:   No enlarged inguinal lymph nodes.         Results for orders placed or performed in visit on 09/12/13   AMB POC URINALYSIS DIP STICK AUTO W/O MICRO   Result Value Ref Range    Color (UA POC) Yellow     Clarity (UA POC) Clear     Glucose (UA POC) Negative Negative    Bilirubin (UA POC) Negative Negative    Ketones (UA POC) Negative Negative    Specific gravity (UA POC) 1.020 1.001 - 1.035    Blood (UA POC) Trace Negative    pH (UA POC) 6.0 4.6 - 8.0    Protein (UA POC) Negative Negative mg/dL    Urobilinogen (UA POC) 1 mg/dL 0.2 - 1    Nitrites (UA POC) Negative Negative    Leukocyte esterase (UA POC) Negative Negative         REVIEW OF LABS AND IMAGING:     Imaging Report Reviewed?  NO     Images Reviewed?      NO           Other Lab Data Reviewed?   YES         ASSESSMENT:     ICD-9-CM    1. Microscopic hematuria  599.72 AMB POC URINALYSIS DIP STICK AUTO W/O MICRO   2. BPH w urinary obs/LUTS 600.01    3. Atherosclerosis of native coronary artery without angina pectoris 414.01             PLAN / DISCUSSION: :his BPH remained stable. His PSAs have all remained fairly low without any significant changes. Coronary disease seems stable at the present time. The microhematuria is benign in nature. Return here in one year for followup DRE.       Ethlyn GalleryHarland A Mitch Arquette, MD on 09/12/2013         Please note:    This document has been produced using voice recognition software. Unrecognized errors in transcription may be present.

## 2013-09-16 NOTE — Progress Notes (Signed)
1. Have you been to the ER, urgent care clinic or hospitalized since your last visit? NO.     2. Have you seen or consulted any other health care providers outside of the [facility] since your last visit (Include any pap smears or colon screening)? NO      Do you have an Advanced Directive? NO    Would you like information on Advanced Directives? NO

## 2013-09-16 NOTE — Progress Notes (Signed)
Raymond HildingDavid F [name],born 02-23-1945, is a 68 y.o. male, who is seen today for reevaluation of atherosclerotic heart disease hyperlipidemia elevated PSA and most recently edema of the right lower extremity. The edema is a little better but persists over the last several weeks. Venous studies were negative but CT of the abdomen showed an 8 x 6 cm mass in the right pelvis and also a large exophytic right renal cyst measuring 6 x 7 cm and a smaller 1.7 x 1.9 cm hypodense lesion in the upper anterior pole the right kidney. He has seen vascular surgery and followup there as scheduled soon, the PA will be discussing the situation with Dr. Ronne BinningMcKenzie today or tomorrow. Biopsy of the pelvic mass was performed. He feels well otherwise, some occasional gas and borborygmi but no pain or constipation. Appetite is good. Gait is normal. No urinary symptoms and no fecal incontinence. No exertional chest discomfort. He is on is very healthy diet and takes all of his medicine correctly.  He'll followup with a urologist yesterday for a PSA of 3.3 and stable. The urologist is temporary.    Past Medical History   Diagnosis Date   ??? Coronary artery disease      inferior wall myocardial infarction and stenting of the RCA 9/03; Status post CABG X3 in January 2006.   ??? 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 Hypertension    ??? Hypercholesteremia      low HDL   ??? Ventricular tachycardia, nonsustained (HCC)    ??? 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 Pleural effusion on right    ??? Echocardiogram 11/08/2005     LVE.  EF 45-50%.  Inferior, inferolateral WMA.     ??? S/P cardiac cath 02/29/2004     oRCA 100%.  LM patent.  p/mLAD 85%.  D1 90%.  D2 90%.  CX 45%.  LVEDP 12 mmHg.  EF 45%.  CABG recommended.   ??? Myocardial perfusion 02/22/2010     Fixed defect in inferior base c/w artifact vs prior inferobasal injury. LVE. EF 55%. MIld inferobasal HK noted.   ??? Hypertrophy of prostate with urinary obstruction and other lower urinary  tract symptoms (LUTS)    ??? Elevated prostate specific antigen (PSA)    ??? Microscopic hematuria    ??? Hypertension    ??? Right renal cyst    ??? ASHD (arteriosclerotic heart disease)      Current Outpatient Prescriptions   Medication Sig Dispense Refill   ??? omega-3 acid ethyl esters (LOVAZA) 1 gram capsule Take 1 Cap by mouth every evening. 30 Cap 11   ??? simvastatin (ZOCOR) 20 mg tablet Take 1 Tab by mouth nightly. 30 Tab 5   ??? amLODIPine (NORVASC) 2.5 mg tablet TAKE ONE TABLET BY MOUTH ONE TIME DAILY 90 Tab 2   ??? losartan-hydrochlorothiazide (HYZAAR) 100-12.5 mg per tablet TAKE ONE TABLET BY MOUTH ONE TIME DAILY 90 Tab 2   ??? potassium chloride SR (KLOR-CON 10) 10 mEq tablet Take 1 Tab by mouth daily. 30 Tab 5   ??? carvedilol (COREG) 12.5 mg tablet Take 1 tablet by mouth two (2) times a day. (Patient taking differently: Take 6.25 mg by mouth two (2) times a day.) 180 tablet 3   ??? triamcinolone (NASACORT AQ) 55 mcg nasal inhaler INSTILL 2 SPRAYS INTO EACH NOSTRIL DAILY 17 g 8   ??? aspirin 81 mg tablet Take 81 mg by mouth daily.     ???  MULTIVITAMIN PO Take 1 Tab by mouth daily.       Visit Vitals   Item Reading   ??? BP 118/64 mmHg   ??? Pulse 56   ??? Temp 98.6 ??F (37 ??C)   ??? Resp 14   ??? Ht 5\' 9"  (1.753 m)   ??? Wt 182 lb (82.555 kg)   ??? BMI 26.86 kg/m2     Carotids are 2+ without bruits. Lungs are clear to percussion. Good breath sounds with no wheezing or crackles. Heart reveals a regular rhythm with normal S1 and S2 no murmur gallop or rub. Apical impulse is not palpable. Abdomen is soft and nontender with no hepatosplenomegaly or masses no bruits. Extremities reveal no clubbing or cyanosis. There is still 1+ lower leg edema from the knee on down on the right. No calf tenderness. No inguinal adenopathy.  Pulses are 2+ throughout.    Results for orders placed or performed in visit on 09/12/13   AMB POC URINALYSIS DIP STICK AUTO W/O MICRO   Result Value Ref Range    Color (UA POC) Yellow     Clarity (UA POC) Clear      Glucose (UA POC) Negative Negative    Bilirubin (UA POC) Negative Negative    Ketones (UA POC) Negative Negative    Specific gravity (UA POC) 1.020 1.001 - 1.035    Blood (UA POC) Trace Negative    pH (UA POC) 6.0 4.6 - 8.0    Protein (UA POC) Negative Negative mg/dL    Urobilinogen (UA POC) 1 mg/dL 0.2 - 1    Nitrites (UA POC) Negative Negative    Leukocyte esterase (UA POC) Negative Negative     Glucose is 107, creatinine 1.33, PSA 3.3, LDL cholesterol 79. 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    Assessment: #1.  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 Dr. Ronne Binning will be discussing resection with the patient in the near future, unclear whether he will do surgery or general surgeon or someone else.  We discussed the various options at some length.  #2. ASHD asymptomatic. He will continue current very healthy diet and avoid weight gain. 3. Hyperlipidemia doing fairly well. He will continue simvastatin 20 mg each evening. #4. Hypertension controlled. He will continue amlodipine 2.5 mg daily and Hyzaar 100-12.5, one daily.  #5. Increase in PSA, still in the normal range and seems to have stabilized. He will followup with neurology as directed.  #6.  Complex renal cysts, to be managed by his urologist.    Follow up here in 6 months for complete evaluation, And followup in the interim depending on disposition regarding his pelvic mass and renal cysts.    Aster Screws [name], MD FACP    Please note:  This document has been produced using voice recognition software. Unrecognized errors in transcription may be present.

## 2013-09-29 MED ORDER — POTASSIUM CHLORIDE SR 10 MEQ TAB
10 mEq | ORAL_TABLET | Freq: Every day | ORAL | Status: DC
Start: 2013-09-29 — End: 2015-03-25

## 2013-09-29 NOTE — Telephone Encounter (Signed)
From: [name]  To: Edrick Oh, MD  Sent: 09/27/2013 1:23 PM EDT  Subject:  Medication Renewal Request    Original  authorizing provider: Edrick Oh, MD    [name] would like a refill of the following medications:  potassium  chloride SR (KLOR-CON 10) 10 mEq tablet Edrick Oh, MD]    Preferred  pharmacy: [address] - [place], [place] - [address]    Comment:

## 2013-09-30 NOTE — Progress Notes (Signed)
1. Have you been to the ER, urgent care clinic since your last visit?  Hospitalized since your last visit? Biopsy mass 09/04/13  2. Have you seen or consulted any other health care providers outside of the [facility] since your last visit?  Include any pap smears or colon screening. NO

## 2013-09-30 NOTE — Progress Notes (Signed)
HISTORY OF PRESENT ILLNESS  Raymond [name] is a 68 y.o. male.    HPI  He has been feeling well from a cardiac standpoint. He denies chest pain, dyspnea, orthopnea or PND. He denies palpitations, dizziness or syncope. He has had no recurrence of sunken feelings. He is no longer experiencing diaphoresis since he has stopped Niaspan. He has had no symptoms to indicate TIA or amaurosis fugax. 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 He is scheduled to have surgical removal of the benign tumor. He underwent the follow up stress nuclear cardiac imaging on 04/11/13 which demonstrated a small to moderate sized fixed defect in the basal inferior and anterolateral wall with very mild reversibility in the inferolateral wall. Because of a gating error the ejection fraction was not calculated.  He reported that the Coreg has been reduced to 6.25 mg twice a day by [employer]. Jill Poling, MD because of the bradycardia.    He has a history of known coronary artery disease and had an acute inferior wall myocardial infarction on October 12, 2001, and had stenting of the right coronary artery at that time. In January 2006, he started experiencing frequent palpitations and dizziness with a feeling as if he was dropping down on a roller coaster. He was admitted to [facility] on February 27, 2004 with an episode of the sinking feeling and nonsustained ventricular tachycardia. He subsequently underwent cardiac catheterization followed by CABG X3 on March 03, 2004, which consisted of:   1. LIMA to the LAD.   2. Sequential SVG to the diagonal and obtuse marginal branch.   His preoperative EF was in the 45% range. He was treated with amiodarone for nonsustained ventricular tachycardia and has had no recurrence of the sinking feeling or palpitations since the bypass surgery. He developed a  large right pleural effusion which was once drained, but he has had some reaccumulation. Because of the sinking feeling, he had a 24-hour Holter monitor which failed to reveal a significant cardiac arrhythmia other than PVCs. He did have the sinking feeling while he was wearing the monitor, but it did not coincide with any significant ventricular arrhythmia.   He had the follow up stress nuclear cardiac imaging on 02/17/2010 which demonstrated fixed defect involving the inferior base with no significant ischemia. Ejection fraction was estimated at 55%.      Review of Systems   Constitutional: Negative for weight loss and malaise/fatigue.   HENT: Negative for hearing loss.    Eyes: Negative for blurred vision and double vision.   Respiratory: Negative for shortness of breath.    Cardiovascular: Positive for leg swelling. Negative for chest pain, palpitations, orthopnea, claudication and PND.   Gastrointestinal: Positive for heartburn. Negative for blood in stool.   Genitourinary: Positive for frequency. Negative for dysuria, urgency and hematuria.   Musculoskeletal: Negative for back pain and joint pain.   Skin: Negative for itching and rash.   Neurological: Positive for dizziness. Negative for loss of consciousness, weakness and headaches.   Psychiatric/Behavioral: Negative for memory loss.       Physical Exam   Constitutional: He is oriented to person, place, and time. He appears well-developed and well-nourished.   HENT:   Head: Normocephalic and atraumatic.   Eyes: Conjunctivae are normal. Pupils are equal, round, and reactive to light.   Neck: Normal range of motion. Neck supple. No JVD present.  Cardiovascular: Regular rhythm, S1 normal and S2 normal.   No extrasystoles are present. Bradycardia present.  PMI is not displaced.  Exam reveals no gallop and no friction rub.    No murmur heard.  Pulses:       Carotid pulses are 3+ on the right side, and 3+ on the left side.   Pulmonary/Chest: Effort normal. He has no rales.   Abdominal: Soft. There is no tenderness.   Musculoskeletal: He exhibits edema.   1+ in Rt. leg   Neurological: He is alert and oriented to person, place, and time.   Skin: Skin is warm and dry.   Psychiatric: He has a normal mood and affect. His behavior is normal. Thought content normal.     BP 140/80 mmHg   Pulse 50   Ht  (1.753 m)   Wt 83.462 kg (184 lb)   BMI 27.16 kg/m2   SpO2 98%    Past Medical History   Diagnosis Date   ??? Coronary artery disease      inferior wall myocardial infarction and stenting of the RCA 9/03; Status post CABG X3 in January 2006.   ??? 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 Hypertension    ??? Hypercholesteremia      low HDL   ??? Ventricular tachycardia, nonsustained (HCC)    ??? 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 Pleural effusion on right    ??? Echocardiogram 11/08/2005     LVE.  EF 45-50%.  Inferior, inferolateral WMA.     ??? S/P cardiac cath 02/29/2004     oRCA 100%.  LM patent.  p/mLAD 85%.  D1 90%.  D2 90%.  CX 45%.  LVEDP 12 mmHg.  EF 45%.  CABG recommended.   ??? Abnormal nuclear cardiac imaging test 04/11/2013     Intermediate risk.  Mild-mod basal inferior infarction w/mild-mod peri-infarct ischemia in mid inferior, prox & mid inferolateral walls.  EF unreadable due to gating error.  Neg EKG on max EST.  Ex time 6 min.     ??? RLE venous duplex 08/05/2013     Right leg:  No DVT.   ??? Elevated prostate specific antigen (PSA)    ??? Microscopic hematuria    ??? Hypertension    ??? Right renal cyst    ??? ASHD (arteriosclerotic heart disease)    ??? Hypertrophy of prostate with urinary obstruction and other lower urinary tract symptoms (LUTS)        History     Social History   ??? Marital Status: MARRIED     Spouse Name: N/A     Number of Children: N/A   ??? Years of Education: N/A     Occupational History   ??? Not on file.     Social History Main Topics   ??? Smoking status: Never Smoker    ??? Smokeless tobacco: Never Used    ??? Alcohol Use: Yes      Comment: social   ??? Drug Use: No   ??? Sexual Activity: Not on file     Other Topics Concern   ??? Not on file     Social History Narrative       Family History   Problem Relation Age of Onset   ??? Heart Disease Father      x3 heart bypass   ??? Arthritis-osteo Mother    ??? Hypertension Sister        Past Surgical  History   Procedure Laterality Date   ??? Stent insertion  10/12/01     right coronary artery   ??? Hx heart catheterization  03/03/04     followed by CABG X3   ??? Hx coronary stent placement  10/12/01     RCA stented using 3.5 x 13 mm Express 2 stent   ??? Hx tonsillectomy     ??? Hx coronary artery bypass graft  03/03/04     LIMA to the LAD. Sequential SVG to the diagonal and obtuse marginal branch   ??? Vascular surgery procedure unlist       biopsy of mass in abdomen, benign       Current Outpatient Prescriptions   Medication Sig Dispense Refill   ??? potassium chloride SR (KLOR-CON 10) 10 mEq tablet Take 1 Tab by mouth daily. 30 Tab 5   ??? omega-3 acid ethyl esters (LOVAZA) 1 gram capsule Take 1 Cap by mouth every evening. 30 Cap 11   ??? simvastatin (ZOCOR) 20 mg tablet Take 1 Tab by mouth nightly. 30 Tab 5   ??? amLODIPine (NORVASC) 2.5 mg tablet TAKE ONE TABLET BY MOUTH ONE TIME DAILY 90 Tab 2   ??? losartan-hydrochlorothiazide (HYZAAR) 100-12.5 mg per tablet TAKE ONE TABLET BY MOUTH ONE TIME DAILY 90 Tab 2   ??? carvedilol (COREG) 12.5 mg tablet Take 1 tablet by mouth two (2) times a day. (Patient taking differently: Take 6.25 mg by mouth two (2) times a day.) 180 tablet 3   ??? triamcinolone (NASACORT AQ) 55 mcg nasal inhaler INSTILL 2 SPRAYS INTO EACH NOSTRIL DAILY 17 g 8   ??? aspirin 81 mg tablet Take 81 mg by mouth daily.     ??? MULTIVITAMIN PO Take 1 Tab by mouth daily.         EKG: unchanged from previous tracings, sinus bradycardia, RBBB, inf.scar  .  ASSESSMENT and PLAN  Encounter Diagnoses   Name Primary?   ??? CAD, history of inferior wall myocardial infarction/status post stenting  of the RCA in September 2003/status post CABG X3 in January 2006/EF 45%. Yes   ??? Hypertension    ??? Nonsustained ventricular tachycardia (HCC)    ??? Hypercholesterolemia    ??? Leg edema, right    He has been doing well from a cardiac standpoint. He has had no symptoms to indicate angina. He shows no signs of cardiac decompensation. His blood pressure has been under control. He has had no recurrent ventricular arrhythmia. His stress nuclear cardiac imaging in March of 2015 demonstrated scarring in the inferior and anterolateral walls with very minimal reversibility. He has had no symptoms of angina and this mild reversibility is in a very tiny area which may not have clinical significance, therefore, I would not pursue further diagnostic work-up. This was discussed with the patient who seems to have a good understanding. For now he will be continued on his current medical regimen.

## 2013-10-07 NOTE — Telephone Encounter (Addendum)
Error

## 2013-10-09 ENCOUNTER — Encounter

## 2013-10-09 LAB — EKG, 12 LEAD, INITIAL
Atrial Rate: 48 {beats}/min
Calculated P Axis: 65 degrees
Calculated R Axis: -46 degrees
Calculated T Axis: 59 degrees
P-R Interval: 200 ms
Q-T Interval: 496 ms
QRS Duration: 160 ms
QTC Calculation (Bezet): 443 ms
Ventricular Rate: 48 {beats}/min

## 2013-10-10 NOTE — Telephone Encounter (Signed)
Pt is scheduled for resection of pelvic mass, cystoscopy and ureter stent on 9/14 with Dr. Bertis Ruddy. Can you clear him from 8/25 visit?

## 2013-11-10 MED ORDER — CARVEDILOL 12.5 MG TAB
12.5 mg | ORAL_TABLET | Freq: Two times a day (BID) | ORAL | Status: DC
Start: 2013-11-10 — End: 2014-02-23

## 2013-11-10 NOTE — Telephone Encounter (Signed)
Last OV 09/16/13

## 2013-11-10 NOTE — Telephone Encounter (Signed)
From: Raymond Cookeyavid F Zeiter  To: Raymond Sloopoderick R Mackinnon, MD  Sent: 11/09/2013 8:04 PM EDT  Subject:  Medication Renewal Request    Original  authorizing provider: Greggory Brandyoderick R. Jill PolingMackinnon, MD    Raymond Cookeyavid  F [name] would like a refill of the following medications:  carvedilol  (COREG) 12.5 mg tablet [Roderick R. Jill PolingMackinnon, MD]    Preferred  pharmacy: Other - [facility] - [address] [place] phone [phone]    Comment:  Please  give me a new prescription for the above carvedilol in a 6.25 mg tablet vice 12.5 mg, since as of June 30th Dr. Jill PolingMacKinnon has had me cutting the existing 12.5 mg tablets in half. Also, my pharmacy has shut down and I now use [facility] [address] - phone [phone]. Thank you.

## 2014-02-09 MED ORDER — SIMVASTATIN 20 MG TAB
20 mg | ORAL_TABLET | Freq: Every evening | ORAL | Status: DC
Start: 2014-02-09 — End: 2014-08-16

## 2014-02-09 NOTE — Telephone Encounter (Signed)
Last Ov 09/16/13

## 2014-02-09 NOTE — Telephone Encounter (Signed)
From: Raymond Mornavid Bratz  To: Raymond Sloopoderick R Mackinnon, Raymond [name]  Sent: 02/08/2014 1:33 PM EST  Subject:  Medication Renewal Request    Original  authorizing provider: Greggory Brandyoderick R. Jill PolingMackinnon, Raymond [name]    Raymond Mornavid  [name] would like a refill of the following medications:  simvastatin  (ZOCOR) 20 mg tablet [Roderick R. Jill PolingMackinnon, Raymond [name]]    [facility]acy[id number] - [place], [place] - [address]    Comment:

## 2014-02-09 NOTE — Telephone Encounter (Signed)
RM sent the med this morning

## 2014-02-23 MED ORDER — CARVEDILOL 12.5 MG TAB
12.5 mg | ORAL_TABLET | Freq: Two times a day (BID) | ORAL | Status: DC
Start: 2014-02-23 — End: 2014-02-23

## 2014-02-23 MED ORDER — CARVEDILOL 6.25 MG TAB
6.25 mg | ORAL_TABLET | Freq: Two times a day (BID) | ORAL | Status: DC
Start: 2014-02-23 — End: 2014-03-23

## 2014-02-23 MED ORDER — CARVEDILOL 6.25 MG TAB
6.25 mg | ORAL_TABLET | Freq: Two times a day (BID) | ORAL | Status: DC
Start: 2014-02-23 — End: 2014-08-16

## 2014-02-23 NOTE — Telephone Encounter (Signed)
From: Raymond [name]  To: Normand Sloopoderick R Mackinnon, MD  Sent: 02/23/2014 11:05 AM EST  Subject:  Medication Renewal Request    Original  authorizing provider: Greggory Brandyoderick R. Jill PolingMackinnon, MD    Raymond Mornavid  Inzunza would like a refill of the following medications:  carvedilol  (COREG) 12.5 mg tablet [Roderick R. Jill PolingMackinnon, MD]    Pref[facility]  pharmacy: [id number] - [place], [place] - [address]    Comment:  Currently  prescribed to take 1/2 a pill twice a day. Please change it to a 6.25 mg tablet twice a day. I tried to get this done before but I was told that the doctor said that it would cost more. I really am having trouble cutting them in half. Please change the prescription to 6.25mg  vice 12.5mg . Thanks. I have already talked with the pharmacy about this and you may be getting a similar request for me from them.

## 2014-03-16 ENCOUNTER — Other Ambulatory Visit: Admit: 2014-03-16 | Discharge: 2014-03-16 | Payer: PRIVATE HEALTH INSURANCE | Primary: Internal Medicine

## 2014-03-16 ENCOUNTER — Inpatient Hospital Stay: Admit: 2014-03-16 | Payer: BLUE CROSS/BLUE SHIELD | Primary: Internal Medicine

## 2014-03-16 DIAGNOSIS — Z1159 Encounter for screening for other viral diseases: Secondary | ICD-10-CM

## 2014-03-17 LAB — CBC WITH AUTOMATED DIFF
ABS. BASOPHILS: 0.1 10*3/uL — ABNORMAL HIGH (ref 0.0–0.06)
ABS. EOSINOPHILS: 0.2 10*3/uL (ref 0.0–0.4)
ABS. LYMPHOCYTES: 1.2 10*3/uL (ref 0.9–3.6)
ABS. MONOCYTES: 0.4 10*3/uL (ref 0.05–1.2)
ABS. NEUTROPHILS: 3.2 10*3/uL (ref 1.8–8.0)
BASOPHILS: 1 % (ref 0–2)
EOSINOPHILS: 4 % (ref 0–5)
HCT: 42.1 % (ref 36.0–48.0)
HGB: 13.3 g/dL (ref 13.0–16.0)
LYMPHOCYTES: 23 % (ref 21–52)
MCH: 29.4 PG (ref 24.0–34.0)
MCHC: 31.6 g/dL (ref 31.0–37.0)
MCV: 92.9 FL (ref 74.0–97.0)
MONOCYTES: 8 % (ref 3–10)
MPV: 10.3 FL (ref 9.2–11.8)
NEUTROPHILS: 64 % (ref 40–73)
PLATELET: 175 10*3/uL (ref 135–420)
RBC: 4.53 M/uL — ABNORMAL LOW (ref 4.70–5.50)
RDW: 14.2 % (ref 11.6–14.5)
WBC: 5 10*3/uL (ref 4.6–13.2)

## 2014-03-17 LAB — LIPID PANEL
CHOL/HDL Ratio: 2.3 (ref 0–5.0)
Cholesterol, total: 143 MG/DL (ref <200)
HDL Cholesterol: 61 MG/DL — ABNORMAL HIGH (ref 40–60)
LDL, calculated: 69 MG/DL (ref 0–100)
Triglyceride: 65 MG/DL (ref <150)
VLDL, calculated: 13 MG/DL

## 2014-03-17 LAB — METABOLIC PANEL, COMPREHENSIVE
A-G Ratio: 1.2 (ref 0.8–1.7)
ALT (SGPT): 25 U/L (ref 16–61)
AST (SGOT): 12 U/L — ABNORMAL LOW (ref 15–37)
Albumin: 3.7 g/dL (ref 3.4–5.0)
Alk. phosphatase: 61 U/L (ref 45–117)
Anion gap: 7 mmol/L (ref 3.0–18)
BUN/Creatinine ratio: 17 (ref 12–20)
BUN: 22 MG/DL — ABNORMAL HIGH (ref 7.0–18)
Bilirubin, total: 0.4 MG/DL (ref 0.2–1.0)
CO2: 31 mmol/L (ref 21–32)
Calcium: 9 MG/DL (ref 8.5–10.1)
Chloride: 108 mmol/L (ref 100–108)
Creatinine: 1.28 MG/DL (ref 0.6–1.3)
GFR est AA: >60 mL/min/{1.73_m2} (ref >60)
GFR est non-AA: 56 mL/min/{1.73_m2} — ABNORMAL LOW (ref >60)
Globulin: 3.1 g/dL (ref 2.0–4.0)
Glucose: 101 mg/dL — ABNORMAL HIGH (ref 74–99)
Potassium: 4.1 mmol/L (ref 3.5–5.5)
Protein, total: 6.8 g/dL (ref 6.4–8.2)
Sodium: 146 mmol/L — ABNORMAL HIGH (ref 136–145)

## 2014-03-17 LAB — URINALYSIS W/ RFLX MICROSCOPIC
Bilirubin: NEGATIVE
Blood: NEGATIVE
Glucose: NEGATIVE mg/dL
Ketone: NEGATIVE mg/dL
Leukocyte Esterase: NEGATIVE
Nitrites: NEGATIVE
Protein: NEGATIVE mg/dL
Specific gravity: 1.021 (ref 1.005–1.030)
Urobilinogen: 0.2 EU/dL (ref 0.2–1.0)
pH (UA): 6.5 (ref 5.0–8.0)

## 2014-03-17 LAB — HEPATITIS C AB
Hep C virus Ab Interp.: NEGATIVE
Hepatitis C virus Ab: <0.02 Index (ref <0.80)

## 2014-03-17 LAB — TSH 3RD GENERATION: TSH: 2.94 u[IU]/mL (ref 0.36–3.74)

## 2014-03-17 LAB — HEPATITIS C ANTIBODY
HCV Ab: <0.02 Index (ref <0.80)
Hepatitis C Ab: NEGATIVE

## 2014-03-23 ENCOUNTER — Ambulatory Visit
Admit: 2014-03-23 | Discharge: 2014-03-23 | Payer: PRIVATE HEALTH INSURANCE | Attending: Cardiovascular Disease | Primary: Internal Medicine

## 2014-03-23 ENCOUNTER — Ambulatory Visit
Admit: 2014-03-23 | Discharge: 2014-03-23 | Payer: PRIVATE HEALTH INSURANCE | Attending: Internal Medicine | Primary: Internal Medicine

## 2014-03-23 DIAGNOSIS — Z0000 Encounter for general adult medical examination without abnormal findings: Secondary | ICD-10-CM

## 2014-03-23 DIAGNOSIS — I2510 Atherosclerotic heart disease of native coronary artery without angina pectoris: Secondary | ICD-10-CM

## 2014-03-23 MED ORDER — AMLODIPINE 5 MG TAB
5 mg | ORAL_TABLET | Freq: Every day | ORAL | Status: DC
Start: 2014-03-23 — End: 2015-04-03

## 2014-03-23 NOTE — Progress Notes (Signed)
HISTORY OF PRESENT ILLNESS  Raymond [name] is a 69 y.o. male.    HPI  He has been feeling well.  He denies chest pain, dyspnea, PND.  He denies palpitation, dizziness or syncope.  He has not had any sinking feeling for a long period of time.  He denies any symptoms of TIA or amaurosis fugax.  His cholesterol profile has been excellent.     He has a history of known coronary artery disease and had an acute inferior wall myocardial infarction on October 12, 2001, and had stenting of the right coronary artery at that time. In January 2006, he started experiencing frequent palpitations and dizziness with a feeling as if he was dropping down on a roller coaster. [facility] was [facility], 2004 with an episode of the sinking feeling and nonsustained ventricular tachycardia. He subsequently underwent cardiac catheterization followed by CABG X3 on March 03, 2004, which consisted of: ??  1. LIMA to the LAD. ??  2. Sequential SVG to the diagonal and obtuse marginal branch. ??  His preoperative EF was in the 45% range. He was treated with amiodarone for nonsustained ventricular tachycardia and has had no recurrence of the sinking feeling or palpitations since the bypass surgery. He developed a large right pleural effusion which was once drained, but he has had some reaccumulation. Because of the sinking feeling, he had a 24-hour Holter monitor which failed to reveal a significant cardiac arrhythmia other than PVCs. He did have the sinking feeling while he was wearing the monitor, but it did not coincide with any significant ventricular arrhythmia. ??  He had the follow up stress nuclear cardiac imaging on 02/17/2010 which demonstrated fixed defect involving the inferior base with no significant ischemia. Ejection fraction was estimated at 55%.  He underwent the follow up stress nuclear cardiac imaging on 04/11/13 which demonstrated a small to moderate sized fixed defect in the basal inferior  and anterolateral wall with very mild reversibility in the inferolateral wall. Because of a gating error the ejection fraction was not calculated.  He reported that the Coreg has been reduced to 6.25 mg twice a day by Advanced Micro Devicesoderick R. Jill PolingMackinnon, MD because of the bradycardia.    Review of Systems   Constitutional: Negative for weight loss and malaise/fatigue.   HENT: Negative for hearing loss.    Eyes: Negative for blurred vision and double vision.   Respiratory: Negative for shortness of breath.    Cardiovascular: Negative for chest pain, palpitations, orthopnea, claudication, leg swelling and PND.   Gastrointestinal: Negative for heartburn, blood in stool and melena.   Genitourinary: Positive for frequency. Negative for dysuria, urgency and hematuria.   Musculoskeletal: Negative for back pain and joint pain.   Skin: Negative for itching and rash.   Neurological: Positive for headaches. Negative for dizziness, loss of consciousness and weakness.   Psychiatric/Behavioral: Negative for memory loss.       Physical Exam   Constitutional: He is oriented to person, place, and time. He appears well-developed and well-nourished.   HENT:   Head: Normocephalic and atraumatic.   Eyes: Conjunctivae are normal. Pupils are equal, round, and reactive to light.   Neck: Normal range of motion. Neck supple. No JVD present.   Cardiovascular: Regular rhythm, S1 normal and S2 normal.   No extrasystoles are present. Bradycardia present.  PMI is not displaced.  Exam reveals no gallop and no friction rub.    No murmur heard.  Pulses:  Carotid pulses are 3+ on the right side, and 3+ on the left side.  Pulmonary/Chest: Effort normal. He has no rales.   Abdominal: Soft. There is no tenderness.   Musculoskeletal: He exhibits no edema.   Neurological: He is alert and oriented to person, place, and time.   Skin: Skin is warm and dry.   Psychiatric: He has a normal mood and affect. His behavior is normal.      BP 160/80 mmHg   Pulse 48   Ht  (1.753 m)   Wt 80.74 kg (178 lb)   BMI 26.27 kg/m2   SpO2 98%    Past Medical History   Diagnosis Date   ??? Coronary artery disease      inferior wall myocardial infarction and stenting of the RCA 9/03; Status post CABG X3 in January 2006.   ??? 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 Hypertension    ??? Hypercholesteremia      low HDL   ??? Ventricular tachycardia, nonsustained (HCC)    ??? 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 Pleural effusion on right    ??? Echocardiogram 11/08/2005     LVE.  EF 45-50%.  Inferior, inferolateral WMA.     ??? S/P cardiac cath 02/29/2004     oRCA 100%.  LM patent.  p/mLAD 85%.  D1 90%.  D2 90%.  CX 45%.  LVEDP 12 mmHg.  EF 45%.  CABG recommended.   ??? Abnormal nuclear cardiac imaging test 04/11/2013     Intermediate risk.  Mild-mod basal inferior infarction w/mild-mod peri-infarct ischemia in mid inferior, prox & mid inferolateral walls.  EF unreadable due to gating error.  Neg EKG on max EST.  Ex time 6 min.     ??? RLE venous duplex 08/05/2013     Right leg:  No DVT.   ??? Elevated prostate specific antigen (PSA)    ??? Microscopic hematuria    ??? Hypertension    ??? Right renal cyst    ??? ASHD (arteriosclerotic heart disease)    ??? Hypertrophy of prostate with urinary obstruction and other lower urinary tract symptoms (LUTS)        History     Social History   ??? Marital Status: MARRIED     Spouse Name: N/A   ??? Number of Children: N/A   ??? Years of Education: N/A     Occupational History   ??? Not on file.     Social History Main Topics   ??? Smoking status: Never Smoker    ??? Smokeless tobacco: Never Used   ??? Alcohol Use: Yes      Comment: social   ??? Drug Use: No   ??? Sexual Activity: Not on file     Other Topics Concern   ??? Not on file     Social History Narrative       Family History   Problem Relation Age of Onset   ??? Heart Disease Father      x3 heart bypass   ??? Arthritis-osteo Mother    ??? Hypertension Sister        Past Surgical History    Procedure Laterality Date   ??? Stent insertion  10/12/01     right coronary artery   ??? Hx heart catheterization  03/03/04     followed by CABG X3   ??? Hx coronary stent placement  10/12/01     RCA stented using 3.5  x 13 mm Express 2 stent   ??? Hx tonsillectomy     ??? Hx coronary artery bypass graft  03/03/04     LIMA to the LAD. Sequential SVG to the diagonal and obtuse marginal branch   ??? Vascular surgery procedure unlist       biopsy of mass in abdomen, benign       Current Outpatient Prescriptions   Medication Sig Dispense Refill   ??? carvedilol (COREG) 6.25 mg tablet Take 1 Tab by mouth two (2) times daily (with meals). 180 Tab 1   ??? simvastatin (ZOCOR) 20 mg tablet Take 1 Tab by mouth nightly. 30 Tab 5   ??? potassium chloride SR (KLOR-CON 10) 10 mEq tablet Take 1 Tab by mouth daily. 30 Tab 5   ??? omega-3 acid ethyl esters (LOVAZA) 1 gram capsule Take 1 Cap by mouth every evening. 30 Cap 11   ??? amLODIPine (NORVASC) 2.5 mg tablet TAKE ONE TABLET BY MOUTH ONE TIME DAILY 90 Tab 2   ??? losartan-hydrochlorothiazide (HYZAAR) 100-12.5 mg per tablet TAKE ONE TABLET BY MOUTH ONE TIME DAILY 90 Tab 2   ??? triamcinolone (NASACORT AQ) 55 mcg nasal inhaler INSTILL 2 SPRAYS INTO EACH NOSTRIL DAILY 17 g 8   ??? aspirin 81 mg tablet Take 81 mg by mouth daily.     ??? MULTIVITAMIN PO Take 1 Tab by mouth daily.         EKG: unchanged from previous tracings, sinus bradycardia, RBBB, inf.scar  .  ASSESSMENT and PLAN  Encounter Diagnoses   Name Primary?   ??? CAD, history of inferior wall myocardial infarction/status post stenting of the RCA in September 2003/status post CABG X3 in January 2006/EF 45%. Yes   ??? Hypertension    ??? Nonsustained ventricular tachycardia (HCC)    ??? Hypercholesterolemia    He continues to do well.  He has had no symptoms to indicate angina or cardiac decompensation.  His cholesterol profile has been very satisfactory.  His blood pressure is mildly elevated.  I am unable to  increase his Coreg because of resting bradycardia and at this time I would increase Amlodipine to 5 mg from 2.5 mg.  The patient was advised to watch out for any leg edema.

## 2014-03-23 NOTE — Progress Notes (Signed)
1. Have you been to the ER, urgent care clinic or hospitalized since your last visit? NO           2. Have you seen or consulted any other health care providers outside of the  Health System since your last visit (Include any pap smears or colon screening)? NO    Do you have an Advanced Directive? NO    Would you like information on Advanced Directives? NO

## 2014-03-23 NOTE — Progress Notes (Signed)
Raymond [name], Raymond [name] Jul 19, 1945, is a 69 y.o. male, who is seen today for a routine physical exam and followup on hypertension hyperlipidemia slightly elevated glucose readings. Over the last 6 months he has lost 8 pounds and hopes to keep that down. He takes all of his medicine regularly but his blood pressure was 160 this morning at his cardiologist's office of the amlodipine was increased from 2.5 mg to 5 mg daily. Previously it was discontinued by his cardiologist. He is eating a very healthy diet and feeling otherwise well. He has a followup appointment in August with his urologist.    Past Medical History   Diagnosis Date   ??? Coronary artery disease      inferior wall myocardial infarction and stenting of the RCA 9/03; Status post CABG X3 in January 2006.   ??? Myocardial infarction, inferior wall ([place]) 10/12/01     acute   ??? Hypertension    ??? Hypercholesteremia      low HDL   ??? Ventricular tachycardia, nonsustained ([place])    ??? 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 Pleural effusion on right    ??? Echocardiogram 11/08/2005     LVE.  EF 45-50%.  Inferior, inferolateral WMA.     ??? S/P cardiac cath 02/29/2004     oRCA 100%.  LM patent.  p/mLAD 85%.  D1 90%.  D2 90%.  CX 45%.  LVEDP 12 mmHg.  EF 45%.  CABG recommended.   ??? Abnormal nuclear cardiac imaging test 04/11/2013     Intermediate risk.  Mild-mod basal inferior infarction w/mild-mod peri-infarct ischemia in mid inferior, prox & mid inferolateral walls.  EF unreadable due to gating error.  Neg EKG on max EST.  Ex time 6 min.     ??? RLE venous duplex 08/05/2013     Right leg:  No DVT.   ??? Elevated prostate specific antigen (PSA)    ??? Microscopic hematuria    ??? Hypertension    ??? Right renal cyst    ??? ASHD (arteriosclerotic heart disease)    ??? Hypertrophy of prostate with urinary obstruction and other lower urinary tract symptoms (LUTS)      Past Surgical History   Procedure Laterality Date   ??? Stent insertion  10/12/01     right coronary artery   ??? Hx heart catheterization  03/03/04      followed by CABG X3   ??? Hx coronary stent placement  10/12/01     RCA stented using 3.5 x 13 mm Express 2 stent   ??? Hx tonsillectomy     ??? Hx coronary artery bypass graft  03/03/04     LIMA to the LAD. Sequential SVG to the diagonal and obtuse marginal branch   ??? Vascular surgery procedure unlist       biopsy of mass in abdomen, benign     Current Outpatient Prescriptions   Medication Sig Dispense Refill   ??? amLODIPine (NORVASC) 5 mg tablet Take 1 Tab by mouth daily. 90 Tab 3   ??? carvedilol (COREG) 6.25 mg tablet Take 1 Tab by mouth two (2) times daily (with meals). 180 Tab 1   ??? simvastatin (ZOCOR) 20 mg tablet Take 1 Tab by mouth nightly. 30 Tab 5   ??? potassium chloride SR (KLOR-CON 10) 10 mEq tablet Take 1 Tab by mouth daily. 30 Tab 5   ??? omega-3 acid ethyl esters (LOVAZA) 1 gram capsule Take 1 Cap by mouth every evening. 30 Cap 11   ???  losartan-hydrochlorothiazide (HYZAAR) 100-12.5 mg per tablet TAKE ONE TABLET BY MOUTH ONE TIME DAILY 90 Tab 2   ??? triamcinolone (NASACORT AQ) 55 mcg nasal inhaler INSTILL 2 SPRAYS INTO EACH NOSTRIL DAILY 17 g 8   ??? aspirin 81 mg tablet Take 81 mg by mouth daily.     ??? MULTIVITAMIN PO Take 1 Tab by mouth daily.       Allergies   Allergen Reactions   ??? Ace Inhibitors Unknown (comments)     Intolerance b/c of dry coughing   ??? Bee Sting [Sting, Bee] Hives     History     Social History   ??? Marital Status: MARRIED     Spouse Name: N/A   ??? Number of Children: N/A   ??? Years of Education: N/A     Social History Main Topics   ??? Smoking status: Never Smoker    ??? Smokeless tobacco: Never Used   ??? Alcohol Use: Yes      Comment: social   ??? Drug Use: No   ??? Sexual Activity: Not on file     Other Topics Concern   ??? None     Social History Narrative     Visit Vitals   Item Reading   ??? BP 148/78 mmHg   ??? Pulse 71   ??? Temp(Src) 97.9 ??F (36.6 ??C) (Oral)   ??? Resp 12   ??? Ht $Remo'5\' 9"'hHYim$  (1.753 m)   ??? Wt 176 lb (79.833 kg)   ??? BMI 25.98 kg/m2   ??? SpO2 97%      The patient is a well-developed, well-nourished male in no apparent distress. HEENT: Pupils are equal and react to light and extraocular movements are full.  Ear canals and tympanic membranes appear normal. Oral cavity appears normal with no oral lesions. Neck: Carotids are 2+ without bruits. No adenopathy or thyromegaly. Lungs are clear to percussion. I hear no wheezing, rales or rhonchi. Heart reveals a nondisplaced apical impulse in the fifth interspace at the midclavicular line. Rhythm is regular and no murmur, gallop, click or rub. Abdomen is soft and nontender with no hepatosplenomegaly or masses. No distention or tympany and normoactive bowel sounds. Extremities reveal no clubbing cyanosis or edema. Pulses are 2+ throughout. Normal external genitalia with no hernia. Rectal exam is normal.  The prostate is symmetric and smooth with no nodules.   No suspicious skin growths.    Results for orders placed or performed during the hospital encounter of 03/16/14   HEPATITIS C AB   Result Value Ref Range    Hepatitis C virus Ab <0.02 <0.80 Index    Hep C  virus Ab Interp. NEGATIVE  NEG      Hep C  virus Ab comment         CBC WITH AUTOMATED DIFF   Result Value Ref Range    WBC 5.0 4.6 - 13.2 K/uL    RBC 4.53 (L) 4.70 - 5.50 M/uL    HGB 13.3 13.0 - 16.0 g/dL    HCT 42.1 36.0 - 48.0 %    MCV 92.9 74.0 - 97.0 FL    MCH 29.4 24.0 - 34.0 PG    MCHC 31.6 31.0 - 37.0 g/dL    RDW 14.2 11.6 - 14.5 %    PLATELET 175 135 - 420 K/uL    MPV 10.3 9.2 - 11.8 FL    NEUTROPHILS 64 40 - 73 %    LYMPHOCYTES 23 21 - 52 %  MONOCYTES 8 3 - 10 %    EOSINOPHILS 4 0 - 5 %    BASOPHILS 1 0 - 2 %    ABS. NEUTROPHILS 3.2 1.8 - 8.0 K/UL    ABS. LYMPHOCYTES 1.2 0.9 - 3.6 K/UL    ABS. MONOCYTES 0.4 0.05 - 1.2 K/UL    ABS. EOSINOPHILS 0.2 0.0 - 0.4 K/UL    ABS. BASOPHILS 0.1 (H) 0.0 - 0.06 K/UL    DF AUTOMATED     LIPID PANEL   Result Value Ref Range    LIPID PROFILE          Cholesterol, total 143 <200 MG/DL    Triglyceride 65 <150 MG/DL     HDL Cholesterol 61 (H) 40 - 60 MG/DL    LDL, calculated 69 0 - 100 MG/DL    VLDL, calculated 13 MG/DL    CHOL/HDL Ratio 2.3 0 - 5.0     METABOLIC PANEL, COMPREHENSIVE   Result Value Ref Range    Sodium 146 (H) 136 - 145 mmol/L    Potassium 4.1 3.5 - 5.5 mmol/L    Chloride 108 100 - 108 mmol/L    CO2 31 21 - 32 mmol/L    Anion gap 7 3.0 - 18 mmol/L    Glucose 101 (H) 74 - 99 mg/dL    BUN 22 (H) 7.0 - 18 MG/DL    Creatinine 1.28 0.6 - 1.3 MG/DL    BUN/Creatinine ratio 17 12 - 20      GFR est AA >60 >60 ml/min/1.64m2    GFR est non-AA 56 (L) >60 ml/min/1.61m2    Calcium 9.0 8.5 - 10.1 MG/DL    Bilirubin, total 0.4 0.2 - 1.0 MG/DL    ALT 25 16 - 61 U/L    AST 12 (L) 15 - 37 U/L    Alk. phosphatase 61 45 - 117 U/L    Protein, total 6.8 6.4 - 8.2 g/dL    Albumin 3.7 3.4 - 5.0 g/dL    Globulin 3.1 2.0 - 4.0 g/dL    A-G Ratio 1.2 0.8 - 1.7     URINALYSIS W/ RFLX MICROSCOPIC   Result Value Ref Range    Color YELLOW      Appearance CLEAR      Specific gravity 1.021 1.005 - 1.030      pH (UA) 6.5 5.0 - 8.0      Protein NEGATIVE  NEG mg/dL    Glucose NEGATIVE  NEG mg/dL    Ketone NEGATIVE  NEG mg/dL    Bilirubin NEGATIVE  NEG      Blood NEGATIVE  NEG      Urobilinogen 0.2 0.2 - 1.0 EU/dL    Nitrites NEGATIVE  NEG      Leukocyte Esterase NEGATIVE  NEG     TSH, 3RD GENERATION   Result Value Ref Range    TSH 2.94 0.36 - 3.74 uIU/mL     Assessment: #1. After strike her disease asymptomatic. He will continue carvedilol 6.25 mg daily and aspirin 81 mg daily. 2. Hypertension slightly inadequately controlled. His amlodipine was increased to 5 mg earlier today and he will continue Hyzaar 100-12.5, one daily. #3. Hyperlipidemia doing well. We'll continue simvastatin 20 mg each evening. #4. History of slightly elevated glucose, improved. We'll try to keep his weight down where it is now with healthy diet and exercise.    Followup 6 months with lab    [place]. Celesta Aver, MD FACP     Please  note:  This document has been produced using voice recognition software. Unrecognized errors in transcription may be present.

## 2014-03-23 NOTE — Progress Notes (Signed)
1. Have you been to the ER, urgent care clinic since your last visit?  Hospitalized since your last visit? NO  2. Have you seen or consulted any other health care providers outside of the [facility] since your last visit?  Include any pap smears or colon screening.  NO

## 2014-03-30 ENCOUNTER — Encounter: Attending: Cardiovascular Disease | Primary: Internal Medicine

## 2014-04-07 MED ORDER — POTASSIUM CHLORIDE SR 10 MEQ TAB, PARTICLES/CRYSTALS
10 mEq | ORAL_TABLET | ORAL | Status: DC
Start: 2014-04-07 — End: 2014-10-31

## 2014-04-29 MED ORDER — LOSARTAN-HYDROCHLOROTHIAZIDE 100 MG-12.5 MG TAB
ORAL_TABLET | Freq: Every day | ORAL | Status: DC
Start: 2014-04-29 — End: 2015-01-26

## 2014-04-29 NOTE — Telephone Encounter (Signed)
From: Dorena Cookeyavid F Punches  To: Normand Sloopoderick R Mackinnon, MD  Sent: 04/29/2014 8:17 AM EDT  Subject:  Medication Renewal Request    Original  authorizing provider: Greggory Brandyoderick R. Jill PolingMackinnon, MD    Dorena Cookeyavid  F Riviere would like a refill of the following medications:  losartan-hydrochlorothiazide  (HYZAAR) 100-12.5 mg per tablet [Roderick R. Jill PolingMackinnon, MD]    [facility]  pharmacy[facility] [id number] - [place], [place] - [address]    Comment:

## 2014-04-29 NOTE — Telephone Encounter (Signed)
Last ov 03/23/14

## 2014-08-16 MED ORDER — CARVEDILOL 6.25 MG TAB
6.25 mg | ORAL_TABLET | Freq: Two times a day (BID) | ORAL | Status: DC
Start: 2014-08-16 — End: 2015-02-12

## 2014-08-16 MED ORDER — SIMVASTATIN 20 MG TAB
20 mg | ORAL_TABLET | Freq: Every evening | ORAL | Status: DC
Start: 2014-08-16 — End: 2015-04-08

## 2014-09-06 MED ORDER — OMEGA-3 ACID ETHYL ESTERS 1 GRAM CAP
1 gram | ORAL_CAPSULE | ORAL | Status: DC
Start: 2014-09-06 — End: 2014-10-03

## 2014-09-07 ENCOUNTER — Inpatient Hospital Stay: Admit: 2014-09-07 | Payer: BLUE CROSS/BLUE SHIELD | Primary: Internal Medicine

## 2014-09-07 ENCOUNTER — Other Ambulatory Visit: Admit: 2014-09-07 | Discharge: 2014-09-07 | Payer: PRIVATE HEALTH INSURANCE | Primary: Internal Medicine

## 2014-09-07 DIAGNOSIS — R972 Elevated prostate specific antigen [PSA]: Secondary | ICD-10-CM

## 2014-09-07 LAB — METABOLIC PANEL, COMPREHENSIVE
A-G Ratio: 1.2 (ref 0.8–1.7)
ALT (SGPT): 21 U/L (ref 16–61)
AST (SGOT): 13 U/L — ABNORMAL LOW (ref 15–37)
Albumin: 3.6 g/dL (ref 3.4–5.0)
Alk. phosphatase: 57 U/L (ref 45–117)
Anion gap: 6 mmol/L (ref 3.0–18)
BUN/Creatinine ratio: 16 (ref 12–20)
BUN: 20 MG/DL — ABNORMAL HIGH (ref 7.0–18)
Bilirubin, total: 0.5 MG/DL (ref 0.2–1.0)
CO2: 31 mmol/L (ref 21–32)
Calcium: 8.8 MG/DL (ref 8.5–10.1)
Chloride: 105 mmol/L (ref 100–108)
Creatinine: 1.29 MG/DL (ref 0.6–1.3)
GFR est AA: >60 mL/min/{1.73_m2} (ref >60)
GFR est non-AA: 55 mL/min/{1.73_m2} — ABNORMAL LOW (ref >60)
Globulin: 3.1 g/dL (ref 2.0–4.0)
Glucose: 106 mg/dL — ABNORMAL HIGH (ref 74–99)
Potassium: 3.8 mmol/L (ref 3.5–5.5)
Protein, total: 6.7 g/dL (ref 6.4–8.2)
Sodium: 142 mmol/L (ref 136–145)

## 2014-09-07 LAB — LIPID PANEL
CHOL/HDL Ratio: 2.3 (ref 0–5.0)
Cholesterol, total: 139 MG/DL (ref <200)
HDL Cholesterol: 60 MG/DL (ref 40–60)
LDL, calculated: 67.6 MG/DL (ref 0–100)
Triglyceride: 57 MG/DL (ref <150)
VLDL, calculated: 11.4 MG/DL

## 2014-09-07 LAB — PSA, DIAGNOSTIC (PROSTATE SPECIFIC AG): Prostate Specific Ag: 2.3 ng/mL (ref 0.0–4.0)

## 2014-09-11 ENCOUNTER — Encounter: Attending: Urology | Primary: Internal Medicine

## 2014-09-11 ENCOUNTER — Ambulatory Visit
Admit: 2014-09-11 | Discharge: 2014-09-11 | Payer: PRIVATE HEALTH INSURANCE | Attending: Urology | Primary: Internal Medicine

## 2014-09-11 DIAGNOSIS — N401 Enlarged prostate with lower urinary tract symptoms: Secondary | ICD-10-CM

## 2014-09-11 LAB — AMB POC URINALYSIS DIP STICK AUTO W/O MICRO
Bilirubin (UA POC): NEGATIVE
Glucose (UA POC): NEGATIVE
Ketones (UA POC): NEGATIVE
Nitrites (UA POC): NEGATIVE
Protein (UA POC): NEGATIVE mg/dL
Specific gravity (UA POC): 1.02 (ref 1.001–1.035)
Urobilinogen (UA POC): 0.2 (ref 0.2–1)
pH (UA POC): 6.5 (ref 4.6–8.0)

## 2014-09-11 NOTE — Progress Notes (Signed)
Raymond [name] has a reminder for a "due or due soon" health maintenance. I have asked that he contact his primary care provider for follow-up on this health maintenance.

## 2014-09-11 NOTE — Progress Notes (Signed)
Raymond [name]    Chief Complaint   Patient presents with   ??? Benign Prostatic Hypertrophy   ??? Nocturia       History and Physical    The patient is a 69 year old male Caucasian who is known to this office and has been followed for modest prostatism and mildly elevated PSA.  His PSA last year had been 3.3.    Last year around this time the patient was found to have a large pelvic mass.  In September of last year the patient underwent exploration with removal of this mass.  The patient had cystoscopy and placement of stents at that time to facilitate ureteral identification and to avoid injury.  The pathology returned a schwannoma and no follow-up has been deemed necessary.  The patient does not have any stigmata of neurofibromatosis    Past Medical History   Diagnosis Date   ??? Coronary artery disease      inferior wall myocardial infarction and stenting of the RCA 9/03; Status post CABG X3 in January 2006.   ??? 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 Hypertension    ??? Hypercholesteremia      low HDL   ??? Ventricular tachycardia, nonsustained (HCC)    ??? 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 Pleural effusion on right    ??? Echocardiogram 11/08/2005     LVE.  EF 45-50%.  Inferior, inferolateral WMA.     ??? S/P cardiac cath 02/29/2004     oRCA 100%.  LM patent.  p/mLAD 85%.  D1 90%.  D2 90%.  CX 45%.  LVEDP 12 mmHg.  EF 45%.  CABG recommended.   ??? Abnormal nuclear cardiac imaging test 04/11/2013     Intermediate risk.  Mild-mod basal inferior infarction w/mild-mod peri-infarct ischemia in mid inferior, prox & mid inferolateral walls.  EF unreadable due to gating error.  Neg EKG on max EST.  Ex time 6 min.     ??? RLE venous duplex 08/05/2013     Right leg:  No DVT.   ??? Elevated prostate specific antigen (PSA)    ??? Microscopic hematuria    ??? Hypertension    ??? Right renal cyst    ??? ASHD (arteriosclerotic heart disease)     ??? Hypertrophy of prostate with urinary obstruction and other lower urinary tract symptoms (LUTS)      Patient Active Problem List   Diagnosis Code   ??? CAD, history of inferior wall myocardial infarction/status post stenting of the RCA in September 2003/status post CABG X3 in January 2006/EF 45%. I25.10   ??? Nonsustained ventricular tachycardia (HCC) I47.2   ??? Hypercholesteremia E78.0   ??? PVC's    ??? BPH w urinary obs/LUTS N40.1   ??? Elevated prostate specific antigen (PSA) R97.2   ??? ASHD (arteriosclerotic heart disease) I25.10   ??? Leg edema, right R60.0   ??? Atherosclerosis of native coronary artery without angina pectoris I25.10   ??? Essential hypertension I10     Past Surgical History   Procedure Laterality Date   ??? Stent insertion  10/12/01     right coronary artery   ??? Hx heart catheterization  03/03/04     followed by CABG X3   ??? Hx coronary stent placement  10/12/01     RCA stented using 3.5 x 13 mm Express 2 stent   ??? Hx tonsillectomy     ??? Hx coronary artery bypass graft  03/03/04  LIMA to the LAD. Sequential SVG to the diagonal and obtuse marginal branch   ??? Vascular surgery procedure unlist       biopsy of mass in abdomen, benign     Current Outpatient Prescriptions   Medication Sig Dispense Refill   ??? omega-3 acid ethyl esters (LOVAZA) 1 gram capsule TAKE 1 CAPSULE BY MOUTH EVERY EVENING 30 Cap 0   ??? carvedilol (COREG) 6.25 mg tablet Take 1 Tab by mouth two (2) times daily (with meals). 180 Tab 1   ??? simvastatin (ZOCOR) 20 mg tablet Take 1 Tab by mouth nightly. 90 Tab 3   ??? losartan-hydrochlorothiazide (HYZAAR) 100-12.5 mg per tablet Take 1 Tab by mouth daily. 90 Tab 2   ??? potassium chloride (K-DUR, KLOR-CON) 10 mEq tablet TAKE ONE TABLET BY MOUTH ONE TIME DAILY 30 Tab 6   ??? amLODIPine (NORVASC) 5 mg tablet Take 1 Tab by mouth daily. 90 Tab 3   ??? triamcinolone (NASACORT AQ) 55 mcg nasal inhaler INSTILL 2 SPRAYS INTO EACH NOSTRIL DAILY 17 g 8   ??? aspirin 81 mg tablet Take 81 mg by mouth daily.      ??? MULTIVITAMIN PO Take 1 Tab by mouth daily.     ??? potassium chloride SR (KLOR-CON 10) 10 mEq tablet Take 1 Tab by mouth daily. 30 Tab 5     Allergies   Allergen Reactions   ??? Ace Inhibitors Unknown (comments) and Cough     Intolerance b/c of dry coughing   ??? Bee Sting [Sting, Bee] Hives     History     Social History   ??? Marital Status: MARRIED     Spouse Name: N/A   ??? Number of Children: N/A   ??? Years of Education: N/A     Occupational History   ??? Not on file.     Social History Main Topics   ??? Smoking status: Never Smoker    ??? Smokeless tobacco: Never Used   ??? Alcohol Use: Yes      Comment: social   ??? Drug Use: No   ??? Sexual Activity: Not on file     Other Topics Concern   ??? Not on file     Social History Narrative      Family History   Problem Relation Age of Onset   ??? Heart Disease Father      x3 heart bypass   ??? Arthritis-osteo Mother    ??? Hypertension Sister              BP 133/78 mmHg   Pulse 58   Temp(Src) 97.9 ??F (36.6 ??C)   Ht 5\' 9"  (1.753 m)   Wt 182 lb (82.555 kg)   BMI 26.86 kg/m2   SpO2 98%  Physical ??      Gen: WDWN adult NAD  Head?? : normocephalic,?? Normal ROM; eyes without normal pupils, EOMs, no masses;?? conjunctiva normal  Neck: normal movement,?? no evident mass,?? No evident adenopathy, trachea midline,  Lungs clear to auscultation with no rales or ronchi or rubs  Cardiac NSR with no murmur, rub, extra sounds  Abd :bowel sounds normal, no masses, tenderness, organomegaly  Flanks????   GU-prostate is broad flat smooth and perhaps 50 g    Extremities- no edema, arthritis, deformity, swelling  Psych- oriented, no evident anxiety, no cognitive impairment evident    Urine today is trace positive for blood and trace positive for white blood cells.  The patient has a long history of  microhematuria and has been evaluated in the past with no sinister findings  Recent PSA is 2.1   I have reviewed the pathology from September with the patient and shown him his preoperative CT scan at that time.  I have explained the reasons why the stents were necessary  The patient also mentions to me that he has virtually no fluid with ejaculation and had been told that he might have retrograde ejaculation.  I have explained to him that this is a normal anatomical process as result of prostatic enlargement and narrowing of the prostatic fossa.  The patient is reassured                PSA and return in 1 year        This visit exceeded 25 minutes and >50% was counselling  The patient understands the discussion and plan    PLEASE NOTE:      This document has been produced using voice recognition software.  Unrecognized errors in transcription may be present    Veverly Fells, MD

## 2014-09-11 NOTE — Patient Instructions (Addendum)
Benign Prostatic Hyperplasia: Care Instructions  Your Care Instructions     Benign prostatic hyperplasia, or BPH, is an enlarged prostate gland. The prostate is a small gland that makes some of the fluid in semen. Prostate enlargement happens to almost all men as they age. It is usually not serious. BPH does not cause prostate cancer.  As the prostate gets bigger, it may partly block the flow of urine. You may have a hard time getting a urine stream started or completely stopped. BPH can cause dribbling. You may have a weak urine stream, or you may have to urinate more often than you used to, especially at night. Most men find these problems easy to manage.  You do not need treatment unless your symptoms bother you a lot or you have other problems, such as bladder infections or stones. In these cases, medicines may help. Surgery is not needed unless the urine flow is blocked or the symptoms do not get better with medicine.  Follow-up care is a key part of your treatment and safety. Be sure to make and go to all appointments, and call your doctor if you are having problems. It's also a good idea to know your test results and keep a list of the medicines you take.  How can you care for yourself at home?  ?? Take plenty of time to urinate. Try to relax.  ?? Try "double voiding." Urinate as much you can, relax for a few moments, and then try to urinate again.  ?? Sit on the toilet to urinate.  ?? Read or think of other things while you are waiting.  ?? Turn on a faucet, or try to picture running water. Some men find that this helps get their urine flowing.  ?? If dribbling is a problem, wash your penis daily to avoid skin irritation and infection.  ?? Avoid caffeine and alcohol. These drinks will increase how often you need to urinate. Spread your fluid intake throughout the day. If the urge to urinate often wakes you at night, limit your fluid intake in the evening. Urinate right before you go to bed.   ?? Many over-the-counter cold and allergy medicines can make the symptoms of BPH worse. Avoid antihistamines, decongestants, and allergy pills, if you can. Read the warnings on the package.  ?? If you take any prescription medicines, especially tranquilizers or antidepressants, ask your doctor or pharmacist whether they can cause urination problems. There may be other medicines you can use that do not cause urinary problems.  ?? Be safe with medicines. Take your medicines exactly as prescribed. Call your doctor if you think you are having a problem with your medicine.  When should you call for help?  Call your doctor now or seek immediate medical care if:  ?? You cannot urinate at all.  ?? You have symptoms of a urinary infection. For example:  ?? You have blood or pus in your urine.  ?? You have pain in your back just below your rib cage. This is called flank pain.  ?? You have a fever, chills, or body aches.  ?? It hurts to urinate.  ?? You have groin or belly pain.  Watch closely for changes in your health, and be sure to contact your doctor if:  ?? It hurts when you ejaculate.  ?? Your urinary problems get a lot worse or bother you a lot.  Where can you learn more?  Go to http://www.healthwise.net/[employer]  Enter C033 in the search   box to learn more about "Benign Prostatic Hyperplasia: Care Instructions."  ?? 2006-2016 [employer]. Care instructions adapted under license by [employer] (which disclaims liability or warranty for this information). This care instruction is for use with your licensed healthcare professional. If you have questions about a medical condition or this instruction, always ask your healthcare professional. Healthwise, Incorporated disclaims any warranty or liability for your use of this information.  Content Version: 10.9.538570; Current as of: Jun 27, 2013

## 2014-09-14 ENCOUNTER — Encounter

## 2014-09-14 ENCOUNTER — Encounter: Primary: Internal Medicine

## 2014-09-21 ENCOUNTER — Ambulatory Visit
Admit: 2014-09-21 | Discharge: 2014-09-21 | Payer: PRIVATE HEALTH INSURANCE | Attending: Internal Medicine | Primary: Internal Medicine

## 2014-09-21 ENCOUNTER — Ambulatory Visit
Admit: 2014-09-21 | Discharge: 2014-09-21 | Payer: PRIVATE HEALTH INSURANCE | Attending: Cardiovascular Disease | Primary: Internal Medicine

## 2014-09-21 DIAGNOSIS — I2510 Atherosclerotic heart disease of native coronary artery without angina pectoris: Secondary | ICD-10-CM

## 2014-09-21 NOTE — Progress Notes (Signed)
HISTORY OF PRESENT ILLNESS  Raymond CookeyDavid F [name] is a 69 y.o. male.    HPI  Raymond HuaDavid has been doing very well.  He offers no cardiac complaints.  He has had no chest pain, dyspnea, orthopnea or PND.  He has had no palpitations, dizziness or syncope.  He has had no symptoms to indicate TIA or amaurosis fugax.  His cholesterol profile has been very satisfactory.  His activity level seems to be adequate.                 He has a history of known coronary artery disease and had an acute inferior wall myocardial infarction on October 12, 2001, and had stenting of the right coronary artery at that time. In January 2006, he started experiencing frequent palpitations and dizziness with a feeling as if he was dropping down on a roller coaster. He was [facility] on February 27, 2004 with an episode of the sinking feeling and nonsustained ventricular tachycardia. He subsequently underwent cardiac catheterization followed by CABG X3 on March 03, 2004, which consisted of: ??  1. LIMA to the LAD. ??  2. Sequential SVG to the diagonal and obtuse marginal branch. ??  His preoperative EF was in the 45% range. He was treated with amiodarone for nonsustained ventricular tachycardia and has had no recurrence of the sinking feeling or palpitations since the bypass surgery. He developed a large right pleural effusion which was once drained, but he has had some reaccumulation. Because of the sinking feeling, he had a 24-hour Holter monitor which failed to reveal a significant cardiac arrhythmia other than PVCs. He did have the sinking feeling while he was wearing the monitor, but it did not coincide with any significant ventricular arrhythmia. ??  He had the follow up stress nuclear cardiac imaging on 02/17/2010 which demonstrated fixed defect involving the inferior base with no significant ischemia. Ejection fraction was estimated at 55%.  He underwent the follow up stress nuclear cardiac imaging on 04/11/13 which  demonstrated a small to moderate sized fixed defect in the basal inferior and anterolateral wall with very mild reversibility in the inferolateral wall. Because of a gating error the ejection fraction was not calculated.  He reported that the Coreg has been reduced to 6.25 mg twice a day by [employer] R. Jill PolingMackinnon, MD because of the bradycardia.    Review of Systems   Constitutional: Negative for malaise/fatigue and weight loss.   HENT: Negative for hearing loss.    Eyes: Negative for blurred vision and double vision.   Respiratory: Negative for shortness of breath.    Cardiovascular: Negative for chest pain, palpitations, orthopnea, claudication, leg swelling and PND.   Gastrointestinal: Negative for blood in stool, heartburn and melena.   Genitourinary: Negative for dysuria, frequency, hematuria and urgency.   Musculoskeletal: Negative for back pain and joint pain.   Skin: Negative for itching and rash.   Neurological: Negative for dizziness, loss of consciousness, weakness and headaches.   Psychiatric/Behavioral: Negative for depression and memory loss.       Physical Exam   Constitutional: He is oriented to person, place, and time. He appears well-developed and well-nourished.   HENT:   Head: Normocephalic and atraumatic.   Eyes: Conjunctivae are normal. Pupils are equal, round, and reactive to light.   Neck: Normal range of motion. Neck supple. No JVD present.   Cardiovascular: Regular rhythm, S1 normal and S2 normal.   No extrasystoles are present. Bradycardia present.  PMI is  not displaced.  Exam reveals no gallop and no friction rub.    No murmur heard.  Pulses:       Carotid pulses are 3+ on the right side, and 3+ on the left side.  Pulmonary/Chest: Effort normal. He has no rales.   Abdominal: Soft. There is no tenderness.   Musculoskeletal: He exhibits no edema.   Neurological: He is alert and oriented to person, place, and time. No cranial nerve deficit.   Skin: Skin is warm and dry.    Psychiatric: He has a normal mood and affect. His behavior is normal.     Visit Vitals   ??? BP 132/78   ??? Pulse (!) 48   ??? Ht  (1.753 m)   ??? Wt 83.9 kg (185 lb)   ??? SpO2 99%   ??? BMI 27.32 kg/m2       Past Medical History   Diagnosis Date   ??? Abnormal nuclear cardiac imaging test 04/11/2013     Intermediate risk.  Mild-mod basal inferior infarction w/mild-mod peri-infarct ischemia in mid inferior, prox & mid inferolateral walls.  EF unreadable due to gating error.  Neg EKG on max EST.  Ex time 6 min.     ??? ASHD (arteriosclerotic heart disease)    ??? Coronary artery disease      inferior wall myocardial infarction and stenting of the RCA 9/03; Status post CABG X3 in January 2006.   ??? Echocardiogram 11/08/2005     LVE.  EF 45-50%.  Inferior, inferolateral WMA.     ??? Elevated prostate specific antigen (PSA)    ??? Hypercholesteremia      low HDL   ??? Hypertension    ??? Hypertension    ??? Hypertrophy of prostate with urinary obstruction and other lower urinary tract symptoms (LUTS)    ??? Microscopic hematuria    ??? 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 Pleural effusion on right    ??? Right renal cyst    ??? RLE venous duplex 08/05/2013     Right leg:  No DVT.   ??? 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 S/P cardiac cath 02/29/2004     oRCA 100%.  LM patent.  p/mLAD 85%.  D1 90%.  D2 90%.  CX 45%.  LVEDP 12 mmHg.  EF 45%.  CABG recommended.   ??? Ventricular tachycardia, nonsustained (HCC)        Social History     Social History   ??? Marital status: MARRIED     Spouse name: N/A   ??? Number of children: N/A   ??? Years of education: N/A     Occupational History   ??? Not on file.     Social History Main Topics   ??? Smoking status: Never Smoker   ??? Smokeless tobacco: Never Used   ??? Alcohol use Yes      Comment: social   ??? Drug use: No   ??? Sexual activity: Not on file     Other Topics Concern   ??? Not on file     Social History Narrative       Family History   Problem Relation Age of Onset   ??? Heart Disease Father       x3 heart bypass   ??? Arthritis-osteo Mother    ??? Hypertension Sister        Past Surgical History   Procedure Laterality Date   ??? Stent insertion  10/12/01     right coronary artery   ??? Hx heart catheterization  03/03/04     followed by CABG X3   ??? Hx coronary stent placement  10/12/01     RCA stented using 3.5 x 13 mm Express 2 stent   ??? Hx tonsillectomy     ??? Hx coronary artery bypass graft  03/03/04     LIMA to the LAD. Sequential SVG to the diagonal and obtuse marginal branch   ??? Vascular surgery procedure unlist       biopsy of mass in abdomen, benign       Current Outpatient Prescriptions   Medication Sig Dispense Refill   ??? omega-3 acid ethyl esters (LOVAZA) 1 gram capsule TAKE 1 CAPSULE BY MOUTH EVERY EVENING 30 Cap 0   ??? carvedilol (COREG) 6.25 mg tablet Take 1 Tab by mouth two (2) times daily (with meals). 180 Tab 1   ??? simvastatin (ZOCOR) 20 mg tablet Take 1 Tab by mouth nightly. 90 Tab 3   ??? losartan-hydrochlorothiazide (HYZAAR) 100-12.5 mg per tablet Take 1 Tab by mouth daily. 90 Tab 2   ??? potassium chloride (K-DUR, KLOR-CON) 10 mEq tablet TAKE ONE TABLET BY MOUTH ONE TIME DAILY 30 Tab 6   ??? amLODIPine (NORVASC) 5 mg tablet Take 1 Tab by mouth daily. 90 Tab 3   ??? triamcinolone (NASACORT AQ) 55 mcg nasal inhaler INSTILL 2 SPRAYS INTO EACH NOSTRIL DAILY 17 g 8   ??? aspirin 81 mg tablet Take 81 mg by mouth daily.     ??? MULTIVITAMIN PO Take 1 Tab by mouth daily.     ??? potassium chloride SR (KLOR-CON 10) 10 mEq tablet Take 1 Tab by mouth daily. 30 Tab 5       EKG: unchanged from previous tracings, sinus bradycardia, RBBB, inf.scar  .  ASSESSMENT and PLAN  Encounter Diagnoses   Name Primary?   ??? CAD, history of inferior wall myocardial infarction/status post stenting of the RCA in September 2003/status post CABG X3 in January 2006/EF 45%. Yes   ??? Nonsustained ventricular tachycardia (HCC)    ??? Hypercholesterolemia    Terril continues to do well.  He has had no symptoms to indicate recurrent  ventricular tachycardia or symptom feeling.  He has had no angina.  He shows no signs of cardiac decompensation.  He continues to have bradycardia from the betablocker, but he is completely asymptomatic.  His blood pressure has been under control.  For now, he will be continued on his current medical regimen.

## 2014-09-21 NOTE — Progress Notes (Signed)
Raymond HildingDavid F Hemberger,born 01-Apr-1945, is a 69 y.o. male, who is seen today for reevaluation of catheters chronic heart disease hyperlipidemia hypertension impaired fasting glucose.  He tries to eat a healthy diet and keep his weight down but he has gained several pounds over the last couple of years.  He remains active.  He saw his cardiologist earlier today and was supposed to have a stress test in about 6 months though he is asymptomatic.  He takes his medicine correctly.  No other new complaints.    Past Medical History   Diagnosis Date   ??? Abnormal nuclear cardiac imaging test 04/11/2013     Intermediate risk.  Mild-mod basal inferior infarction w/mild-mod peri-infarct ischemia in mid inferior, prox & mid inferolateral walls.  EF unreadable due to gating error.  Neg EKG on max EST.  Ex time 6 min.     ??? ASHD (arteriosclerotic heart disease)    ??? Coronary artery disease      inferior wall myocardial infarction and stenting of the RCA 9/03; Status post CABG X3 in January 2006.   ??? Echocardiogram 11/08/2005     LVE.  EF 45-50%.  Inferior, inferolateral WMA.     ??? Elevated prostate specific antigen (PSA)    ??? Hypercholesteremia      low HDL   ??? Hypertension    ??? Hypertension    ??? Hypertrophy of prostate with urinary obstruction and other lower urinary tract symptoms (LUTS)    ??? Microscopic hematuria    ??? 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 Pleural effusion on right    ??? Right renal cyst    ??? RLE venous duplex 08/05/2013     Right leg:  No DVT.   ??? 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 S/P cardiac cath 02/29/2004     oRCA 100%.  LM patent.  p/mLAD 85%.  D1 90%.  D2 90%.  CX 45%.  LVEDP 12 mmHg.  EF 45%.  CABG recommended.   ??? Ventricular tachycardia, nonsustained (HCC)      Current Outpatient Prescriptions   Medication Sig Dispense Refill   ??? omega-3 acid ethyl esters (LOVAZA) 1 gram capsule TAKE 1 CAPSULE BY MOUTH EVERY EVENING 30 Cap 0   ??? carvedilol (COREG) 6.25 mg tablet Take 1 Tab by mouth two (2) times  daily (with meals). 180 Tab 1   ??? simvastatin (ZOCOR) 20 mg tablet Take 1 Tab by mouth nightly. 90 Tab 3   ??? losartan-hydrochlorothiazide (HYZAAR) 100-12.5 mg per tablet Take 1 Tab by mouth daily. 90 Tab 2   ??? potassium chloride (K-DUR, KLOR-CON) 10 mEq tablet TAKE ONE TABLET BY MOUTH ONE TIME DAILY 30 Tab 6   ??? amLODIPine (NORVASC) 5 mg tablet Take 1 Tab by mouth daily. 90 Tab 3   ??? potassium chloride SR (KLOR-CON 10) 10 mEq tablet Take 1 Tab by mouth daily. 30 Tab 5   ??? triamcinolone (NASACORT AQ) 55 mcg nasal inhaler INSTILL 2 SPRAYS INTO EACH NOSTRIL DAILY 17 g 8   ??? aspirin 81 mg tablet Take 81 mg by mouth daily.     ??? MULTIVITAMIN PO Take 1 Tab by mouth daily.       Visit Vitals   ??? BP 132/66   ??? Pulse (!) 55   ??? Temp 98.2 ??F (36.8 ??C) (Oral)   ??? Resp 12   ??? Ht 5\' 9"  (1.753 m)   ??? Wt 185 lb 8  oz (84.1 kg)   ??? SpO2 98%   ??? BMI 27.39 kg/m2     Carotids are 2+ without bruits.  Lungs are clear to percussion.  Good breath sounds with no wheezing or crackles.  Heart reveals a regular rhythm with normal S1 and S2 and a 2/6 systolic apical murmur without radiation.  Apical impulses not palpable.  Abdomen is soft and nontender with no hepatosplenomegaly or masses and no bruits.  Extremities reveal no clubbing cyanosis or edema.  Pulses are 2+.    Recent laboratory showed LDL cholesterol 67, fasting glucose 106 and other chemistries normal.    Assessment: #1.  ASHD asymptomatic.  He will continue carvedilol 6.25 mg daily and aspirin 81 mg daily #2.  Hypertension well controlled.  He will continue Hyzaar 100???12 0.5, 1 daily and amlodipine 5 mg daily.  #3.  Hyperlipidemia is doing well.  He will continue simvastatin 20 mg each evening.  #4.  Impaired fasting glucose, he will do the best he can to keep his weight down and avoid excessive sweets in the diet.    Follow-up 6 months for complete evaluation    Rochell Mabie [name], MD FACP    Please note:  This document has been produced using voice recognition  software. Unrecognized errors in transcription may be present.

## 2014-09-21 NOTE — Progress Notes (Signed)
1. Have you been to the ER, urgent care clinic since your last visit?  Hospitalized since your last visit? No  2. Have you seen or consulted any other health care providers outside of the [facility] since your last visit?  Include any pap smears or colon screening. No

## 2014-09-22 NOTE — Telephone Encounter (Signed)
Pt is scheduled for cataract surgery 09/30/2014 with Dr[facility] at [facility], pt was seen 09/21/2014 can you use that note for his clearance?    Please advise Kelly @ [phone] ext [phone]

## 2014-10-05 MED ORDER — OMEGA-3 ACID ETHYL ESTERS 1 GRAM CAP
1 gram | ORAL_CAPSULE | Freq: Every day | ORAL | 0 refills | Status: DC
Start: 2014-10-05 — End: 2014-10-31

## 2014-10-05 NOTE — Telephone Encounter (Signed)
From: [name]  To: [name], MD  Sent: 10/03/2014 11:08 AM EDT  Subject:  Medication Renewal Request    Original  authorizing provider: Greggory Brandy. Jill Poling, MD    [name] would like a refill of the following medications:  omega-3  acid ethyl esters (LOVAZA) 1 gram capsule [Roderick [name], MD]    Preferred  pharmacy: [id number] - [place], [place] - [address]    Comment:

## 2014-11-02 MED ORDER — OMEGA-3 ACID ETHYL ESTERS 1 GRAM CAP
1 gram | ORAL_CAPSULE | Freq: Every day | ORAL | 0 refills | Status: DC
Start: 2014-11-02 — End: 2014-12-05

## 2014-11-02 MED ORDER — POTASSIUM CHLORIDE SR 10 MEQ TAB, PARTICLES/CRYSTALS
10 mEq | ORAL_TABLET | Freq: Every day | ORAL | 6 refills | Status: DC
Start: 2014-11-02 — End: 2015-06-01

## 2014-11-02 NOTE — Telephone Encounter (Signed)
From: [name]  To: [name], MD  Sent: 10/31/2014 9:33 AM EDT  Subject:  Medication Renewal Request    Original  authorizing provider: Greggory Brandy. Jill Poling, MD    [name] would like a refill of the following medications:  omega-3  acid ethyl esters (LOVAZA) 1 gram capsule [Roderick [name], MD]    Preferred  pharmacy: [id number] - [place], [place] - [address]  [address]    Comment:      Medication  renewals requested in this message routed to other providers:  potassium  chloride (K-DUR, KLOR-CON) 10 mEq tablet Edrick Oh, MD]

## 2014-11-02 NOTE — Telephone Encounter (Signed)
From: Raymond [name]  To: Edrick Oh, MD  Sent: 10/31/2014 9:33 AM EDT  Subject:  Medication Renewal Request    Original  authorizing provider: Edrick Oh, MD    Raymond [name] would like a refill of the following medications:  potassium  chloride (K-DUR, KLOR-CON) 10 mEq tablet Edrick Oh, MD]    Preferred  pharmacy: [id number] - [place], [place] - [address]  [address]    Comment:      Medication  renewals requested in this message routed to other providers:  omega-3  acid ethyl esters (LOVAZA) 1 gram capsule [Roderick [name], MD]

## 2014-12-07 MED ORDER — OMEGA-3 ACID ETHYL ESTERS 1 GRAM CAP
1 gram | ORAL_CAPSULE | Freq: Every day | ORAL | 0 refills | Status: DC
Start: 2014-12-07 — End: 2015-01-04

## 2014-12-07 NOTE — Telephone Encounter (Signed)
From: Dorena Cookeyavid F Ohnemus  To: Bennie Hindaymund S Dumaran, MD  Sent: 12/05/2014 10:20 AM EDT  Subject:  Medication Renewal Request    Original  authorizing provider: Bennie Hindaymund S Dumaran, MD    Dorena Cookeyavid  F Odowd would like a refill of the following medications:  omega-3  acid ethyl esters (LOVAZA) 1 gram capsule Bennie Hind[[name] Dumaran, MD]    [facility]  pharmacy: [id number] - [place], [place] - [address]  [address]    Comment:

## 2015-01-04 MED ORDER — OMEGA-3 ACID ETHYL ESTERS 1 GRAM CAP
1 gram | ORAL_CAPSULE | Freq: Every day | ORAL | 5 refills | Status: DC
Start: 2015-01-04 — End: 2015-06-28

## 2015-01-04 NOTE — Telephone Encounter (Signed)
Last ov 09/21/14

## 2015-01-26 MED ORDER — LOSARTAN-HYDROCHLOROTHIAZIDE 100 MG-12.5 MG TAB
ORAL_TABLET | Freq: Every day | ORAL | 2 refills | Status: DC
Start: 2015-01-26 — End: 2015-10-23

## 2015-01-26 NOTE — Telephone Encounter (Signed)
Last OV 09/21/14

## 2015-01-26 NOTE — Telephone Encounter (Signed)
From: Raymond Cookeyavid F Brinkmeyer  To: Raymond Sloopoderick R Mackinnon, MD  Sent: 01/26/2015 8:45 AM EST  Subject:  Medication Renewal Request    Original  authorizing provider: Normand Sloopoderick R Mackinnon, MD    Raymond Cookeyavid  F [name] would like a refill of the following medications:  losartan-hydrochlorothiazide  (HYZAAR) 100-12.5 mg per tablet Raymond Sloop[[name], MD]    [facility]  [facility]: [facility] [phone] - [place], [place] - [address]  [address]    Comment:

## 2015-02-12 MED ORDER — CARVEDILOL 6.25 MG TAB
6.25 mg | ORAL_TABLET | Freq: Two times a day (BID) | ORAL | 1 refills | Status: DC
Start: 2015-02-12 — End: 2015-08-11

## 2015-02-12 NOTE — Telephone Encounter (Signed)
From: Raymond Cookeyavid F Barsch  To: Normand Sloopoderick R Mackinnon, MD  Sent: 02/12/2015 1:48 PM EST  Subject:  Medication Renewal Request    Original  authorizing provider: Normand Sloopoderick R Mackinnon, MD    Raymond Cookeyavid  F [name] would like a refill of the following medications:  carvedilol  (COREG) 6.25 mg tablet [name][[name], MD]    [facility]  pharmacy: [facility] [id number] - [place], [place] - [address]  [address]    Comment:

## 2015-03-17 ENCOUNTER — Encounter

## 2015-03-18 ENCOUNTER — Encounter: Primary: Internal Medicine

## 2015-03-18 ENCOUNTER — Inpatient Hospital Stay: Admit: 2015-03-18 | Payer: BLUE CROSS/BLUE SHIELD | Primary: Internal Medicine

## 2015-03-18 ENCOUNTER — Other Ambulatory Visit: Admit: 2015-03-18 | Discharge: 2015-03-18 | Payer: PRIVATE HEALTH INSURANCE | Primary: Internal Medicine

## 2015-03-18 DIAGNOSIS — I10 Essential (primary) hypertension: Secondary | ICD-10-CM

## 2015-03-19 LAB — METABOLIC PANEL, COMPREHENSIVE
A-G Ratio: 1.3 (ref 0.8–1.7)
ALT (SGPT): 29 U/L (ref 16–61)
AST (SGOT): 20 U/L (ref 15–37)
Albumin: 3.8 g/dL (ref 3.4–5.0)
Alk. phosphatase: 58 U/L (ref 45–117)
Anion gap: 8 mmol/L (ref 3.0–18)
BUN/Creatinine ratio: 21 — ABNORMAL HIGH (ref 12–20)
BUN: 25 MG/DL — ABNORMAL HIGH (ref 7.0–18)
Bilirubin, total: 0.7 MG/DL (ref 0.2–1.0)
CO2: 30 mmol/L (ref 21–32)
Calcium: 8.9 MG/DL (ref 8.5–10.1)
Chloride: 106 mmol/L (ref 100–108)
Creatinine: 1.19 MG/DL (ref 0.6–1.3)
GFR est AA: >60 mL/min/{1.73_m2} (ref >60)
GFR est non-AA: >60 mL/min/{1.73_m2} (ref >60)
Globulin: 2.9 g/dL (ref 2.0–4.0)
Glucose: 96 mg/dL (ref 74–99)
Potassium: 3.7 mmol/L (ref 3.5–5.5)
Protein, total: 6.7 g/dL (ref 6.4–8.2)
Sodium: 144 mmol/L (ref 136–145)

## 2015-03-19 LAB — URINE MICROSCOPIC ONLY
Bacteria: NEGATIVE /hpf
RBC: 0 /hpf (ref 0–5)
WBC: 0 /hpf (ref 0–4)

## 2015-03-19 LAB — LIPID PANEL
CHOL/HDL Ratio: 2.6 (ref 0–5.0)
Cholesterol, total: 170 MG/DL (ref <200)
HDL Cholesterol: 66 MG/DL — ABNORMAL HIGH (ref 40–60)
LDL, calculated: 91 MG/DL (ref 0–100)
Triglyceride: 65 MG/DL (ref <150)
VLDL, calculated: 13 MG/DL

## 2015-03-19 LAB — CBC WITH AUTOMATED DIFF
ABS. BASOPHILS: 0.1 10*3/uL — ABNORMAL HIGH (ref 0.0–0.06)
ABS. EOSINOPHILS: 0.2 10*3/uL (ref 0.0–0.4)
ABS. LYMPHOCYTES: 1.2 10*3/uL (ref 0.9–3.6)
ABS. MONOCYTES: 0.5 10*3/uL (ref 0.05–1.2)
ABS. NEUTROPHILS: 3.4 10*3/uL (ref 1.8–8.0)
BASOPHILS: 1 % (ref 0–2)
EOSINOPHILS: 3 % (ref 0–5)
HCT: 41.5 % (ref 36.0–48.0)
HGB: 13.6 g/dL (ref 13.0–16.0)
LYMPHOCYTES: 23 % (ref 21–52)
MCH: 30.6 PG (ref 24.0–34.0)
MCHC: 32.8 g/dL (ref 31.0–37.0)
MCV: 93.3 FL (ref 74.0–97.0)
MONOCYTES: 9 % (ref 3–10)
MPV: 10.2 FL (ref 9.2–11.8)
NEUTROPHILS: 64 % (ref 40–73)
PLATELET: 184 10*3/uL (ref 135–420)
RBC: 4.45 M/uL — ABNORMAL LOW (ref 4.70–5.50)
RDW: 14.2 % (ref 11.6–14.5)
WBC: 5.3 10*3/uL (ref 4.6–13.2)

## 2015-03-19 LAB — TSH 3RD GENERATION: TSH: 2.92 u[IU]/mL (ref 0.36–3.74)

## 2015-03-19 LAB — URINALYSIS W/ RFLX MICROSCOPIC
Bilirubin: NEGATIVE
Blood: NEGATIVE
Glucose: NEGATIVE mg/dL
Ketone: NEGATIVE mg/dL
Nitrites: NEGATIVE
Protein: NEGATIVE mg/dL
Specific gravity: 1.021 (ref 1.005–1.030)
Urobilinogen: 0.2 EU/dL (ref 0.2–1.0)
pH (UA): 7.5 (ref 5.0–8.0)

## 2015-03-19 LAB — HEMOGLOBIN A1C WITH EAG
Est. average glucose: 108 mg/dL
Hemoglobin A1c: 5.4 % (ref 4.2–5.6)

## 2015-03-19 LAB — MICROALBUMIN, UR, RAND W/ MICROALB/CREAT RATIO
Creatinine, urine random: 164.61 mg/dL — ABNORMAL HIGH (ref 30–125)
Microalbumin,urine random: 0.8 MG/DL (ref 0–3.0)
Microalbumin/Creat ratio (mg/g creat): 5 mg/g (ref 0–30)

## 2015-03-24 ENCOUNTER — Encounter: Primary: Internal Medicine

## 2015-03-25 ENCOUNTER — Ambulatory Visit
Admit: 2015-03-25 | Discharge: 2015-03-25 | Payer: PRIVATE HEALTH INSURANCE | Attending: Internal Medicine | Primary: Internal Medicine

## 2015-03-25 ENCOUNTER — Encounter: Attending: Internal Medicine | Primary: Internal Medicine

## 2015-03-25 DIAGNOSIS — Z0000 Encounter for general adult medical examination without abnormal findings: Secondary | ICD-10-CM

## 2015-03-25 NOTE — Progress Notes (Signed)
Raymond [name], Raymond [name] Jan 14, 1946, is a 70 y.o. male, who is seen today for routine physical exam and follow-up on hypertension hyperlipidemia atherosclerotic heart disease impaired fasting glucose and a previous mass in the right lower abdomen.  That was removed in [place] there is been no evidence of recurrence.  He had been seen the urologist for previous elevation of PSA but the urologist keep changing at the office and PSA has been normal the last 2 times at least.  No urinary symptoms.  Ejaculate is minimal and he had discussed that with the urologist as well and was told the tube was small because of aging.  He has atherosclerotic heart disease having had inferior infarction followed by stent in 2003 and triple-vessel bypass in 2006 and has been asymptomatic since then.  He has hyperlipidemia and uses his simvastatin regularly and follows a very healthy diet.  Blood pressure is been controlled on current medicine.  He does note some occasional sinus drainage and did have some sinus pressure in the ears last week but that has cleared.  He had some significant vertigo little over a week ago but by the next day it was much better and since then has cleared.    Past Medical History   Diagnosis Date   ??? Abnormal nuclear cardiac imaging test 04/11/2013     Intermediate risk.  Mild-mod basal inferior infarction w/mild-mod peri-infarct ischemia in mid inferior, prox & mid inferolateral walls.  EF unreadable due to gating error.  Neg EKG on max EST.  Ex time 6 min.     ??? ASHD (arteriosclerotic heart disease)    ??? Coronary artery disease      inferior wall myocardial infarction and stenting of the RCA 9/03; Status post CABG X3 in January 2006.   ??? Echocardiogram 11/08/2005     LVE.  EF 45-50%.  Inferior, inferolateral WMA.     ??? Elevated prostate specific antigen (PSA)    ??? Hypercholesteremia      low HDL   ??? Hypertension    ??? Hypertension    ??? Hypertrophy of prostate with urinary obstruction and other lower urinary  tract symptoms (LUTS)    ??? Microscopic hematuria    ??? Myocardial infarction, inferior wall ([place]) 10/12/01     acute   ??? Pleural effusion on right    ??? Right renal cyst    ??? RLE venous duplex 08/05/2013     Right leg:  No DVT.   ??? 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 S/P cardiac cath 02/29/2004     oRCA 100%.  LM patent.  p/mLAD 85%.  D1 90%.  D2 90%.  CX 45%.  LVEDP 12 mmHg.  EF 45%.  CABG recommended.   ??? Ventricular tachycardia, nonsustained [facility])      Past Surgical History   Procedure Laterality Date   ??? Stent insertion  10/12/01     right coronary artery   ??? Hx heart catheterization  03/03/04     followed by CABG X3   ??? Hx coronary stent placement  10/12/01     RCA stented using 3.5 x 13 mm Express 2 stent   ??? Hx tonsillectomy     ??? Hx coronary artery bypass graft  03/03/04     LIMA to the LAD. Sequential SVG to the diagonal and obtuse marginal branch   ??? Vascular surgery procedure unlist       biopsy of mass in abdomen, benign     Current Outpatient  Prescriptions   Medication Sig Dispense Refill   ??? carvedilol (COREG) 6.25 mg tablet Take 1 Tab by mouth two (2) times daily (with meals). 180 Tab 1   ??? losartan-hydroCHLOROthiazide (HYZAAR) 100-12.5 mg per tablet Take 1 Tab by mouth daily. 90 Tab 2   ??? omega-3 acid ethyl esters (LOVAZA) 1 gram capsule Take 1 Cap by mouth daily (with breakfast). 30 Cap 5   ??? potassium chloride (K-DUR, KLOR-CON) 10 mEq tablet Take 1 Tab by mouth daily. 30 Tab 6   ??? simvastatin (ZOCOR) 20 mg tablet Take 1 Tab by mouth nightly. 90 Tab 3   ??? amLODIPine (NORVASC) 5 mg tablet Take 1 Tab by mouth daily. 90 Tab 3   ??? triamcinolone (NASACORT AQ) 55 mcg nasal inhaler INSTILL 2 SPRAYS INTO EACH NOSTRIL DAILY 17 g 8   ??? aspirin 81 mg tablet Take 81 mg by mouth daily.     ??? MULTIVITAMIN PO Take 1 Tab by mouth daily.       Allergies   Allergen Reactions   ??? Ace Inhibitors Unknown (comments) and Cough     Intolerance b/c of dry coughing   ??? Bee Sting [Sting, Bee] Hives     Social History     Social History    ??? Marital status: MARRIED     Spouse name: N/A   ??? Number of children: N/A   ??? Years of education: N/A     Social History Main Topics   ??? Smoking status: Never Smoker   ??? Smokeless tobacco: Never Used   ??? Alcohol use Yes      Comment: social   ??? Drug use: No   ??? Sexual activity: Not Asked     Other Topics Concern   ??? None     Social History Narrative     Visit Vitals   ??? BP 136/72   ??? Pulse 65   ??? Temp 98.3 ??F (36.8 ??C) (Oral)   ??? Resp 12   ??? Ht 5' 9"  (1.753 m)   ??? Wt 187 lb 9.6 oz (85.1 kg)   ??? SpO2 98%   ??? BMI 27.7 kg/m2     The patient is a well-developed, well-nourished male in no apparent distress. HEENT: Pupils are equal and react to light and extraocular movements are full.  Ear canals and tympanic membranes appear normal. Oral cavity appears normal with no oral lesions. Neck: Carotids are 2+ without bruits. No adenopathy or thyromegaly. Lungs are clear to percussion. I hear no wheezing, rales or rhonchi. Heart reveals a nondisplaced apical impulse in the fifth interspace at the midclavicular line. Rhythm is regular and no murmur, gallop, click or rub. Abdomen is soft and nontender with no hepatosplenomegaly or masses. No distention or tympany and normoactive bowel sounds. Extremities reveal no clubbing cyanosis or edema. Pulses are 2+ throughout. Normal external genitalia with no hernia. Rectal exam is normal.  The prostate is symmetric and smooth with no nodules.   No suspicious skin growths.    Results for orders placed or performed during the hospital encounter of 03/18/15   TSH 3RD GENERATION   Result Value Ref Range    TSH 2.92 0.36 - 3.74 uIU/mL   URINALYSIS W/ RFLX MICROSCOPIC   Result Value Ref Range    Color YELLOW      Appearance TURBID      Specific gravity 1.021 1.005 - 1.030      pH (UA) 7.5 5.0 - 8.0  Protein NEGATIVE  NEG mg/dL    Glucose NEGATIVE  NEG mg/dL    Ketone NEGATIVE  NEG mg/dL    Bilirubin NEGATIVE  NEG      Blood NEGATIVE  NEG      Urobilinogen 0.2 0.2 - 1.0 EU/dL     Nitrites NEGATIVE  NEG      Leukocyte Esterase TRACE (A) NEG     METABOLIC PANEL, COMPREHENSIVE   Result Value Ref Range    Sodium 144 136 - 145 mmol/L    Potassium 3.7 3.5 - 5.5 mmol/L    Chloride 106 100 - 108 mmol/L    CO2 30 21 - 32 mmol/L    Anion gap 8 3.0 - 18 mmol/L    Glucose 96 74 - 99 mg/dL    BUN 25 (H) 7.0 - 18 MG/DL    Creatinine 1.19 0.6 - 1.3 MG/DL    BUN/Creatinine ratio 21 (H) 12 - 20      GFR est AA >60 >60 ml/min/1.41m    GFR est non-AA >60 >60 ml/min/1.7108m   Calcium 8.9 8.5 - 10.1 MG/DL    Bilirubin, total 0.7 0.2 - 1.0 MG/DL    ALT (SGPT) 29 16 - 61 U/L    AST (SGOT) 20 15 - 37 U/L    Alk. phosphatase 58 45 - 117 U/L    Protein, total 6.7 6.4 - 8.2 g/dL    Albumin 3.8 3.4 - 5.0 g/dL    Globulin 2.9 2.0 - 4.0 g/dL    A-G Ratio 1.3 0.8 - 1.7     LIPID PANEL   Result Value Ref Range    LIPID PROFILE          Cholesterol, total 170 <200 MG/DL    Triglyceride 65 <150 MG/DL    HDL Cholesterol 66 (H) 40 - 60 MG/DL    LDL, calculated 91 0 - 100 MG/DL    VLDL, calculated 13 MG/DL    CHOL/HDL Ratio 2.6 0 - 5.0     CBC WITH AUTOMATED DIFF   Result Value Ref Range    WBC 5.3 4.6 - 13.2 K/uL    RBC 4.45 (L) 4.70 - 5.50 M/uL    HGB 13.6 13.0 - 16.0 g/dL    HCT 41.5 36.0 - 48.0 %    MCV 93.3 74.0 - 97.0 FL    MCH 30.6 24.0 - 34.0 PG    MCHC 32.8 31.0 - 37.0 g/dL    RDW 14.2 11.6 - 14.5 %    PLATELET 184 135 - 420 K/uL    MPV 10.2 9.2 - 11.8 FL    NEUTROPHILS 64 40 - 73 %    LYMPHOCYTES 23 21 - 52 %    MONOCYTES 9 3 - 10 %    EOSINOPHILS 3 0 - 5 %    BASOPHILS 1 0 - 2 %    ABS. NEUTROPHILS 3.4 1.8 - 8.0 K/UL    ABS. LYMPHOCYTES 1.2 0.9 - 3.6 K/UL    ABS. MONOCYTES 0.5 0.05 - 1.2 K/UL    ABS. EOSINOPHILS 0.2 0.0 - 0.4 K/UL    ABS. BASOPHILS 0.1 (H) 0.0 - 0.06 K/UL    DF AUTOMATED     HEMOGLOBIN A1C WITH EAG   Result Value Ref Range    Hemoglobin A1c 5.4 4.2 - 5.6 %    Est. average glucose 108 mg/dL   MICROALBUMIN, UR, RAND W/ MICROALBUMIN/CREA RATIO   Result Value Ref Range  Microalbumin,urine random 0.80 0 - 3.0 MG/DL    Creatinine, urine 164.61 (H) 30 - 125 mg/dL    Microalbumin/Creat ratio (mg/g creat) 5 0 - 30 mg/g   URINE MICROSCOPIC ONLY   Result Value Ref Range    WBC 0 to 3 0 - 4 /hpf    RBC 0 0 - 5 /hpf    Epithelial cells FEW 0 - 5 /lpf    Bacteria NEGATIVE  NEG /hpf    Amorphous Crystals 3+ (A) NEG     Assessment: #1.  ASHD asymptomatic.  He will continue aspirin 81 mg daily and carvedilol 6.25 mg twice daily.  #2.  Hyperlipidemia with cholesterol higher than usual but still acceptable.  He will continue simvastatin 20 mg daily and cardiologist still has him taking Lovaza.  #3.  Hypertension is controlled.  He will continue Hyzaar and amlodipine.  #4.  Impaired fasting glucose doing better.  He will try to maintain normal weight and avoid excessive sweets in the diet.  #5.  History of elevated PSA, normal as of 6 months ago.  We will check PSA in 6 months.  #6.  Occasional indigestion in the past, mild more frequent symptoms at night more recently.  Try ranitidine 150 mg before supper, and if that is not working for him he will try omeprazole 20 mg daily.  I recommended he try that for 6 months and then stop it and see if he still needs medication.  #7.  Some recent sinus drainage possibly from allergies possibly a cold but much better.  No further evaluation or treatment is needed at this time.    Follow-up 6 months with lab to include PSA and he will receive PCV 13 now.  He had a flu shot and pharmacy this past fall.

## 2015-03-29 ENCOUNTER — Ambulatory Visit
Admit: 2015-03-29 | Discharge: 2015-03-29 | Payer: PRIVATE HEALTH INSURANCE | Attending: Cardiovascular Disease | Primary: Internal Medicine

## 2015-03-29 DIAGNOSIS — I2510 Atherosclerotic heart disease of native coronary artery without angina pectoris: Secondary | ICD-10-CM

## 2015-03-29 NOTE — Progress Notes (Signed)
1. Have you been to the ER, urgent care clinic since your last visit?  Hospitalized since your last visit? no  2. Have you seen or consulted any other health care providers outside of the [facility] since your last visit?  Include any pap smears or colon screening. no

## 2015-03-29 NOTE — Progress Notes (Signed)
HISTORY OF PRESENT ILLNESS  Raymond [name] is a 70 y.o. male.    HPI  He has been doing very well.  He denies any cardiac symptoms.  He denies chest pain, dyspnea, orthopnea or PND.  He denies palpitations, dizziness or syncope.  He denies any symptoms to indicate TIA or amaurosis fugax.  His cholesterol profile is largely satisfactory, but not meeting the goal with his LDL at 91.  He is currently on simvastatin 20 mg per day.       He has a history of known coronary artery disease and had an acute inferior wall myocardial infarction on October 12, 2001, and had stenting of the right coronary artery at that time. In January 2006, he started experiencing frequent palpitations and dizziness with a feeling as if he was dropping down on a roller coaster. He was admitted to [facility] on February 27, 2004 with an episode of the sinking feeling and nonsustained ventricular tachycardia. He subsequently underwent cardiac catheterization followed by CABG X3 on March 03, 2004, which consisted of:   1. LIMA to the LAD.   2. Sequential SVG to the diagonal and obtuse marginal branch.   His preoperative EF was in the 45% range. He was treated with amiodarone for nonsustained ventricular tachycardia and has had no recurrence of the sinking feeling or palpitations since the bypass surgery. He developed a large right pleural effusion which was once drained, but he has had some reaccumulation. Because of the sinking feeling, he had a 24-hour Holter monitor which failed to reveal a significant cardiac arrhythmia other than PVCs. He did have the sinking feeling while he was wearing the monitor, but it did not coincide with any significant ventricular arrhythmia.   He had the follow up stress nuclear cardiac imaging on 02/17/2010 which demonstrated fixed defect involving the inferior base with no significant ischemia. Ejection fraction was estimated at 55%.   He underwent the follow up stress nuclear cardiac imaging on 04/11/13 which demonstrated a small to moderate sized fixed defect in the basal inferior and anterolateral wall with very mild reversibility in the inferolateral wall. Because of a gating error the ejection fraction was not calculated.  He reported that the Coreg has been reduced to 6.25 mg twice a day by [employer]. Jill Poling, MD because of the bradycardia.    Review of Systems   Constitutional: Negative for malaise/fatigue and weight loss.   HENT: Negative for hearing loss.    Eyes: Negative for blurred vision and double vision.   Respiratory: Negative for shortness of breath.    Cardiovascular: Negative for chest pain, palpitations, orthopnea, claudication and PND.   Gastrointestinal: Negative for blood in stool, heartburn and melena.   Genitourinary: Negative for dysuria, flank pain, frequency, hematuria and urgency.   Musculoskeletal: Negative for back pain and joint pain.   Skin: Negative for itching and rash.   Neurological: Negative for dizziness, loss of consciousness and weakness.   Psychiatric/Behavioral: Negative for depression and memory loss.       Physical Exam   Constitutional: He is oriented to person, place, and time. He appears well-developed and well-nourished.   HENT:   [name]: Normocephalic and atraumatic.   Eyes: Conjunctivae are normal. Pupils are equal, round, and reactive to light.   Neck: Normal range of motion. Neck supple. No JVD present.   Cardiovascular: Regular rhythm, S1 normal and S2 normal.   No extrasystoles are present. Bradycardia present.  PMI is not displaced.  Exam reveals  no gallop and no friction rub.    No murmur heard.  Pulses:       Carotid pulses are 3+ on the right side, and 3+ on the left side.  Pulmonary/Chest: Effort normal. He has no rales.   Abdominal: Soft. There is no tenderness.   Musculoskeletal: He exhibits no edema.   Neurological: He is alert and oriented to person, place, and time. No  cranial nerve deficit.   Skin: Skin is warm and dry.   Psychiatric: He has a normal mood and affect. His behavior is normal.     Visit Vitals   ??? BP 138/68   ??? Pulse (!) 51   ??? Ht 5\' 9"  (1.753 m)   ??? Wt 84.8 kg (187 lb)   ??? SpO2 97%   ??? BMI 27.62 kg/m2       Past Medical History   Diagnosis Date   ??? ASHD (arteriosclerotic heart disease)    ??? CABG x 3 02/2004   ??? Cardiac cath 02/29/2004     oRCA 100%.  LM patent.  p/mLAD 85%.  D1 90%.  D2 90%.  CX 45%.  LVEDP 12 mmHg.  EF 45%.  CABG recommended.   ??? Cardiac echocardiogram 11/08/2005     LVE.  EF 45-50%.  Inferior, inferolateral WMA.     ??? Cardiac nuclear imaging test, mod risk 04/11/2013     Intermediate risk.  Mild-mod basal inferior infarction w/mild-mod peri-infarct ischemia in mid inferior, prox & mid inferolateral walls.  EF unreadable due to gating error.  Neg EKG on max EST.  Ex time 6 min.     ??? Cardiovascular RLE venous duplex 08/05/2013     Right leg:  No DVT.   ??? Coronary artery disease      inferior wall myocardial infarction and stenting of the RCA 9/03; Status post CABG X3 in January 2006.   ??? Elevated prostate specific antigen (PSA)    ??? Hypercholesteremia      low HDL   ??? Hypertension    ??? Hypertension    ??? Hypertrophy of prostate with urinary obstruction and other lower urinary tract symptoms (LUTS)    ??? Microscopic hematuria    ??? 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 Pleural effusion on right    ??? Right renal cyst    ??? Ventricular tachycardia, nonsustained ([facility])        Social History     Social History   ??? Marital status: MARRIED     Spouse name: N/A   ??? Number of children: N/A   ??? Years of education: N/A     Occupational History   ??? Not on file.     Social History Main Topics   ??? Smoking status: Never Smoker   ??? Smokeless tobacco: Never Used   ??? Alcohol use Yes      Comment: social   ??? Drug use: No   ??? Sexual activity: Not on file     Other Topics Concern   ??? Not on file     Social History Narrative       Family History    Problem Relation Age of Onset   ??? Heart Disease Father      x3 heart bypass   ??? Arthritis-osteo Mother    ??? Hypertension Sister        Past Surgical History   Procedure Laterality Date   ??? Stent insertion  10/12/01  right coronary artery   ??? Hx heart catheterization  03/03/04     followed by CABG X3   ??? Hx coronary stent placement  10/12/01     RCA stented using 3.5 x 13 mm Express 2 stent   ??? Hx tonsillectomy     ??? Hx coronary artery bypass graft  03/03/04     LIMA to the LAD. Sequential SVG to the diagonal and obtuse marginal branch   ??? Vascular surgery procedure unlist       biopsy of mass in abdomen, benign       Current Outpatient Prescriptions   Medication Sig Dispense Refill   ??? carvedilol (COREG) 6.25 mg tablet Take 1 Tab by mouth two (2) times daily (with meals). 180 Tab 1   ??? losartan-hydroCHLOROthiazide (HYZAAR) 100-12.5 mg per tablet Take 1 Tab by mouth daily. 90 Tab 2   ??? omega-3 acid ethyl esters (LOVAZA) 1 gram capsule Take 1 Cap by mouth daily (with breakfast). 30 Cap 5   ??? potassium chloride (K-DUR, KLOR-CON) 10 mEq tablet Take 1 Tab by mouth daily. 30 Tab 6   ??? simvastatin (ZOCOR) 20 mg tablet Take 1 Tab by mouth nightly. (Patient taking differently: Take 40 mg by mouth nightly.) 90 Tab 3   ??? amLODIPine (NORVASC) 5 mg tablet Take 1 Tab by mouth daily. 90 Tab 3   ??? triamcinolone (NASACORT AQ) 55 mcg nasal inhaler INSTILL 2 SPRAYS INTO EACH NOSTRIL DAILY 17 g 8   ??? aspirin 81 mg tablet Take 81 mg by mouth daily.     ??? MULTIVITAMIN PO Take 1 Tab by mouth daily.         EKG: unchanged from previous tracings, sinus bradycardia, RBBB, inf.scar  .  ASSESSMENT and PLAN  Encounter Diagnoses   Name Primary?   ??? CAD, history of inferior wall myocardial infarction/status post stenting of the RCA in September 2003/status post CABG X3 in January 2006/EF 45%. Yes   ??? Nonsustained ventricular tachycardia (HCC)    ??? Hypercholesterolemia    He has been doing quite well.  He has had no symptoms to indicate angina  or cardiac decompensation.  He has had no recurrent syncope.  His cholesterol profile is essentially satisfactory, but not reaching his goal.  At this point, I would increase his simvastatin to 40 mg per day from 20 mg.  He will otherwise be continued on his current medical regimen.

## 2015-04-01 ENCOUNTER — Inpatient Hospital Stay: Admit: 2015-04-01 | Payer: BLUE CROSS/BLUE SHIELD | Attending: Cardiovascular Disease | Primary: Internal Medicine

## 2015-04-01 DIAGNOSIS — I2510 Atherosclerotic heart disease of native coronary artery without angina pectoris: Secondary | ICD-10-CM

## 2015-04-01 NOTE — Procedures (Signed)
[facility]  *** FINAL REPORT ***    Name: Raymond [name], Raymond [name]  MRN: [id number]    Outpatient  DOB: [birth year]  HIS Order #: [id number]  TRAKnet Visit #: [id number]  Date: 01 Apr 2015    TYPE OF TEST: Cerebrovascular Duplex    REASON FOR TEST    Right Carotid:-             Proximal               Mid                 Distal  cm/s  Systolic  Diastolic  Systolic  Diastolic  Systolic  Diastolic  CCA:    103.0      18.0       79.0      21.0       52.0      13.0  Bulb:  ECA:     73.0       6.0  ICA:     60.0      13.0       42.0      14.0       52.0      12.0  ICA/CCA:  0.6       0.7    ICA Stenosis: <50%    Right Vertebral:-  Finding: Antegrade  Sys:       39.0  Dia:       13.0    Right Subclavian: Normal    Left Carotid:-            Proximal                Mid                 Distal  cm/s  Systolic  Diastolic  Systolic  Diastolic  Systolic  Diastolic  CCA:    124.0      17.0       95.0      14.0       45.0      12.0  Bulb:  ECA:     46.0       7.0  ICA:     37.0      12.0       45.0      12.0       36.0      13.0  ICA/CCA:  0.4       0.7    ICA Stenosis: Normal    Left Vertebral:-  Finding: Antegrade  Sys:       40.0  Dia:       12.0    Left Subclavian: Normal    INTERPRETATION/FINDINGS  Duplex images were obtained using 2-D gray scale, color flow, and  spectral Doppler analysis.  1. <50% stenosis in the right internal carotid artery.  2. No significant stenosis of the left internal carotid artery.  3. No significant stenosis in the external carotid arteries  bilaterally.  4. Antegrade flow in both vertebral arteries.  5. Normal flow in both subclavian arteries.  Plaque Morphology:  1.Mild Hyperechoic plaque in the bulb and right ICA.    ADDITIONAL COMMENTS  Nodule noted in the right thyroid measuring 1.0 x .51 cm.    I have personally reviewed the data relevant to the interpretation of  this  study.    TECHNOLOGIST: Bess Kinds, [facility], RVT/  Signed: 04/01/2015 01:45 PM    PHYSICIAN:  Samuele Storey L.  Debby Bud, MD   Signed: 04/01/2015 05:56 PM

## 2015-04-01 NOTE — Progress Notes (Signed)
Called him. Ask Dr. Douglass Rivers office to evaluate the thyroid nodule.

## 2015-04-01 NOTE — Procedures (Signed)
[facility]  *** FINAL REPORT ***    Name: WENZEL, BACKLUND  MRN: [id number]    Outpatient  DOB: 22-Jul-1945  HIS Order #: [id number]  TRAKnet Visit #: [id number]  Date: 01 Apr 2015    TYPE OF TEST: Cerebrovascular Duplex    REASON FOR TEST    Right Carotid:-             Proximal               Mid                 Distal  cm/s  Systolic  Diastolic  Systolic  Diastolic  Systolic  Diastolic  CCA:    103.0      18.0       79.0      21.0       52.0      13.0  Bulb:  ECA:     73.0       6.0  ICA:     60.0      13.0       42.0      14.0       52.0      12.0  ICA/CCA:  0.6       0.7    ICA Stenosis: <50%    Right Vertebral:-  Finding: Antegrade  Sys:       39.0  Dia:       13.0    Right Subclavian: Normal    Left Carotid:-            Proximal                Mid                 Distal  cm/s  Systolic  Diastolic  Systolic  Diastolic  Systolic  Diastolic  CCA:    124.0      17.0       95.0      14.0       45.0      12.0  Bulb:  ECA:     46.0       7.0  ICA:     37.0      12.0       45.0      12.0       36.0      13.0  ICA/CCA:  0.4       0.7    ICA Stenosis: Normal    Left Vertebral:-  Finding: Antegrade  Sys:       40.0  Dia:       12.0    Left Subclavian: Normal    INTERPRETATION/FINDINGS  Duplex images were obtained using 2-D gray scale, color flow, and  spectral Doppler analysis.  1. <50% stenosis in the right internal carotid artery.  2. No significant stenosis of the left internal carotid artery.  3. No significant stenosis in the external carotid arteries  bilaterally.  4. Antegrade flow in both vertebral arteries.  5. Normal flow in both subclavian arteries.  Plaque Morphology:  1.Mild Hyperechoic plaque in the bulb and right ICA.    ADDITIONAL COMMENTS  Nodule noted in the right thyroid measuring 1.0 x .51 cm.    I have personally reviewed the data relevant to the interpretation of  this  study.    TECHNOLOGIST: Bess Kinds, [facility], RVT/  Signed: 04/01/2015 01:45 PM    PHYSICIAN:  Charleston Vierling L.  Debby Bud, MD  Signed:  04/01/2015 05:56 PM

## 2015-04-03 NOTE — Telephone Encounter (Signed)
From: Raymond [name]  To: Edrick Oh, MD  Sent: 04/03/2015 9:35 AM EST  Subject:  Medication Renewal Request    Original  authorizing provider: Edrick Oh, MD    Raymond [name] would like a refill of the following medications:  amLODIPine  (NORVASC) 5 mg tablet Edrick Oh, MD]    Preferred  pharmacy: [id number] - [place], [place] - [address]  [address]    Comment:

## 2015-04-05 ENCOUNTER — Encounter: Attending: Internal Medicine | Primary: Internal Medicine

## 2015-04-05 MED ORDER — AMLODIPINE 5 MG TAB
5 mg | ORAL_TABLET | Freq: Every day | ORAL | 3 refills | Status: DC
Start: 2015-04-05 — End: 2016-03-27

## 2015-04-08 MED ORDER — SIMVASTATIN 40 MG TAB
40 mg | ORAL_TABLET | Freq: Every evening | ORAL | 6 refills | Status: DC
Start: 2015-04-08 — End: 2015-11-08

## 2015-04-08 NOTE — Telephone Encounter (Signed)
-----   Message from Edrick Oh, MD sent at 04/05/2015  5:58 PM EST -----  Called him. Ask Dr. Douglass Rivers office to evaluate the thyroid nodule.

## 2015-04-08 NOTE — Telephone Encounter (Signed)
Spoke with Vernona RiegerLaura at Dr. Douglass RiversMackinnon's office. She states she will give the message to Dr. Jill PolingMackinnon

## 2015-04-08 NOTE — Telephone Encounter (Signed)
Dr. Junius Finner office calling wanting to be sure RM looks at recent test. Nodules found.

## 2015-06-02 MED ORDER — POTASSIUM CHLORIDE SR 10 MEQ TAB, PARTICLES/CRYSTALS
10 mEq | ORAL_TABLET | Freq: Every day | ORAL | 6 refills | Status: DC
Start: 2015-06-02 — End: 2016-01-03

## 2015-06-02 NOTE — Telephone Encounter (Signed)
From: Raymond Cookeyavid F Barney  To: Edrick Ohae B Chough, MD  Sent: 06/01/2015 1:36 PM EDT  Subject:  Medication Renewal Request    Original  authorizing provider: Edrick Ohae B Chough, MD    Raymond Cookeyavid  F [name] would like a refill of the following medications:  potassium  chloride (K-DUR, KLOR-CON) 10 mEq tablet Edrick Oh[Dae B Chough, MD]    [id number] - [place], [place] - [address]  [address]    Comment:

## 2015-06-28 MED ORDER — OMEGA-3 ACID ETHYL ESTERS 1 GRAM CAP
1 gram | ORAL_CAPSULE | ORAL | 0 refills | Status: DC
Start: 2015-06-28 — End: 2015-08-03

## 2015-06-29 ENCOUNTER — Inpatient Hospital Stay: Admit: 2015-06-29 | Payer: BLUE CROSS/BLUE SHIELD | Primary: Internal Medicine

## 2015-06-29 ENCOUNTER — Telehealth

## 2015-06-29 DIAGNOSIS — I2510 Atherosclerotic heart disease of native coronary artery without angina pectoris: Secondary | ICD-10-CM

## 2015-06-29 LAB — LIPID PANEL
CHOL/HDL Ratio: 2 (ref 0–5.0)
Cholesterol, total: 132 MG/DL (ref <200)
HDL Cholesterol: 65 MG/DL — ABNORMAL HIGH (ref 40–60)
LDL, calculated: 55.2 MG/DL (ref 0–100)
Triglyceride: 59 MG/DL (ref <150)
VLDL, calculated: 11.8 MG/DL

## 2015-06-29 LAB — HEPATIC FUNCTION PANEL
A-G Ratio: 1.1 (ref 0.8–1.7)
ALT (SGPT): 21 U/L (ref 16–61)
AST (SGOT): 13 U/L — ABNORMAL LOW (ref 15–37)
Albumin: 3.5 g/dL (ref 3.4–5.0)
Alk. phosphatase: 47 U/L (ref 45–117)
Bilirubin, direct: 0.2 MG/DL (ref 0.0–0.2)
Bilirubin, total: 0.8 MG/DL (ref 0.2–1.0)
Globulin: 3.2 g/dL (ref 2.0–4.0)
Protein, total: 6.7 g/dL (ref 6.4–8.2)

## 2015-06-29 NOTE — Progress Notes (Signed)
good

## 2015-06-29 NOTE — Telephone Encounter (Signed)
-----   Message from Edrick Ohae B Chough, MD sent at 06/29/2015 12:16 PM EDT -----  good

## 2015-06-29 NOTE — Telephone Encounter (Signed)
LM on AM for patient to call back

## 2015-08-03 MED ORDER — OMEGA-3 ACID ETHYL ESTERS 1 GRAM CAP
1 gram | ORAL_CAPSULE | ORAL | 0 refills | Status: DC
Start: 2015-08-03 — End: 2015-08-31

## 2015-08-11 MED ORDER — CARVEDILOL 6.25 MG TAB
6.25 mg | ORAL_TABLET | ORAL | 0 refills | Status: DC
Start: 2015-08-11 — End: 2015-11-08

## 2015-08-31 MED ORDER — OMEGA-3 ACID ETHYL ESTERS 1 GRAM CAP
1 gram | ORAL_CAPSULE | ORAL | 0 refills | Status: DC
Start: 2015-08-31 — End: 2015-09-29

## 2015-09-09 ENCOUNTER — Encounter: Admit: 2015-09-09 | Discharge: 2015-09-09 | Payer: PRIVATE HEALTH INSURANCE | Primary: Internal Medicine

## 2015-09-09 ENCOUNTER — Inpatient Hospital Stay: Admit: 2015-09-09 | Payer: BLUE CROSS/BLUE SHIELD | Primary: Internal Medicine

## 2015-09-09 DIAGNOSIS — E785 Hyperlipidemia, unspecified: Secondary | ICD-10-CM

## 2015-09-10 LAB — METABOLIC PANEL, COMPREHENSIVE
A-G Ratio: 1.2 (ref 0.8–1.7)
ALT (SGPT): 24 U/L (ref 16–61)
AST (SGOT): 12 U/L — ABNORMAL LOW (ref 15–37)
Albumin: 3.7 g/dL (ref 3.4–5.0)
Alk. phosphatase: 58 U/L (ref 45–117)
Anion gap: 8 mmol/L (ref 3.0–18)
BUN/Creatinine ratio: 20 (ref 12–20)
BUN: 25 MG/DL — ABNORMAL HIGH (ref 7.0–18)
Bilirubin, total: 0.5 MG/DL (ref 0.2–1.0)
CO2: 29 mmol/L (ref 21–32)
Calcium: 9 MG/DL (ref 8.5–10.1)
Chloride: 105 mmol/L (ref 100–108)
Creatinine: 1.27 MG/DL (ref 0.6–1.3)
GFR est AA: >60 mL/min/{1.73_m2} (ref >60)
GFR est non-AA: 56 mL/min/{1.73_m2} — ABNORMAL LOW (ref >60)
Globulin: 3.1 g/dL (ref 2.0–4.0)
Glucose: 103 mg/dL — ABNORMAL HIGH (ref 74–99)
Potassium: 3.6 mmol/L (ref 3.5–5.5)
Protein, total: 6.8 g/dL (ref 6.4–8.2)
Sodium: 142 mmol/L (ref 136–145)

## 2015-09-10 LAB — LIPID PANEL
CHOL/HDL Ratio: 1.8 (ref 0–5.0)
Cholesterol, total: 121 MG/DL (ref <200)
HDL Cholesterol: 68 MG/DL — ABNORMAL HIGH (ref 40–60)
LDL, calculated: 44.4 MG/DL (ref 0–100)
Triglyceride: 43 MG/DL (ref <150)
VLDL, calculated: 8.6 MG/DL

## 2015-09-10 LAB — PSA SCREENING (SCREENING): Prostate Specific Ag: 2.8 ng/mL (ref 0.0–4.0)

## 2015-09-13 ENCOUNTER — Encounter: Attending: Urology | Primary: Internal Medicine

## 2015-09-15 ENCOUNTER — Encounter

## 2015-09-16 ENCOUNTER — Ambulatory Visit
Admit: 2015-09-16 | Discharge: 2015-09-16 | Payer: PRIVATE HEALTH INSURANCE | Attending: Internal Medicine | Primary: Internal Medicine

## 2015-09-16 DIAGNOSIS — I10 Essential (primary) hypertension: Secondary | ICD-10-CM

## 2015-09-16 NOTE — Progress Notes (Signed)
Raymond [name], tupper 08-27-1945, is a 70 y.o. male, who is seen today for reevaluation of atherosclerotic heart disease hyperlipidemia impaired fasting glucose elevated PSA hypertension and allergic rhinitis.  He is recently been on 2 trips, initially to [place] and then another long trip after that and has gained several pounds.  At home he follows his diet closely and has had a stable weight.  He has had a very little amount of edema with the strips but generally does not have edema.  No dyspnea or chest pain.  No palpitations.  Allergies doing well on current medicine but he when he was off Nasacort for 1 day he felt off balance all day and some increasing nasal stuffiness is present.    Past Medical History:   Diagnosis Date   ??? ASHD (arteriosclerotic heart disease)    ??? CABG x 3 02/2004   ??? Cardiac cath 02/29/2004    oRCA 100%.  LM patent.  p/mLAD 85%.  D1 90%.  D2 90%.  CX 45%.  LVEDP 12 mmHg.  EF 45%.  CABG recommended.   ??? Cardiac echocardiogram 11/08/2005    LVE.  EF 45-50%.  Inferior, inferolateral WMA.     ??? Cardiac nuclear imaging test, mod risk 04/11/2013    Intermediate risk.  Mild-mod basal inferior infarction w/mild-mod peri-infarct ischemia in mid inferior, prox & mid inferolateral walls.  EF unreadable due to gating error.  Neg EKG on max EST.  Ex time 6 min.     ??? Cardiovascular RLE venous duplex 08/05/2013    Right leg:  No DVT.   ??? Coronary artery disease     inferior wall myocardial infarction and stenting of the RCA 9/03; Status post CABG X3 in January 2006.   ??? Elevated prostate specific antigen (PSA)    ??? Hypercholesteremia     low HDL   ??? Hypertension    ??? Hypertension    ??? Hypertrophy of prostate with urinary obstruction and other lower urinary tract symptoms (LUTS)    ??? Microscopic hematuria    ??? Myocardial infarction, inferior wall ([place]) 10/12/01    acute   ??? Pleural effusion on right    ??? Right renal cyst    ??? Ventricular tachycardia, nonsustained (HCC)      Current Outpatient Prescriptions    Medication Sig Dispense Refill   ??? omega-3 acid ethyl esters (LOVAZA) 1 gram capsule TAKE 1 CAPSULE BY MOUTH EVERY DAY WITH BREAKFAST 30 Cap 0   ??? carvedilol (COREG) 6.25 mg tablet TAKE 1 TABLET BY MOUTH TWICE DAILY WITH MEALS 180 Tab 0   ??? potassium chloride (K-DUR, KLOR-CON) 10 mEq tablet Take 1 Tab by mouth daily. 30 Tab 6   ??? simvastatin (ZOCOR) 40 mg tablet Take 1 Tab by mouth nightly. 30 Tab 6   ??? amLODIPine (NORVASC) 5 mg tablet Take 1 Tab by mouth daily. 90 Tab 3   ??? losartan-hydroCHLOROthiazide (HYZAAR) 100-12.5 mg per tablet Take 1 Tab by mouth daily. 90 Tab 2   ??? triamcinolone (NASACORT AQ) 55 mcg nasal inhaler INSTILL 2 SPRAYS INTO EACH NOSTRIL DAILY 17 g 8   ??? aspirin 81 mg tablet Take 81 mg by mouth daily.     ??? MULTIVITAMIN PO Take 1 Tab by mouth daily.       Visit Vitals   ??? BP 136/68   ??? Pulse (!) 58   ??? Temp 97.9 ??F (36.6 ??C) (Oral)   ??? Resp 14   ??? Ht 5' 9"  (1.753  m)   ??? Wt 194 lb 9.6 oz (88.3 kg)   ??? SpO2 97%   ??? BMI 28.74 kg/m2     Carotids are 2+ no bruits.  No JVD or HJR.  Lungs are clear to percussion.  Good breath sounds with no wheezing or crackles.  Heart reveals a regular rhythm with normal S1 and S2 no murmur gallop click or rub.  Apical impulse is not palpable.  Abdomen is soft and nontender with no hepatosplenomegaly or masses and no bruits.  Extremities reveal no clubbing cyanosis or edema.  Pulses are 2+.    Results for orders placed or performed during the hospital encounter of 09/09/15   LIPID PANEL   Result Value Ref Range    LIPID PROFILE          Cholesterol, total 121 <200 MG/DL    Triglyceride 43 <150 MG/DL    HDL Cholesterol 68 (H) 40 - 60 MG/DL    LDL, calculated 44.4 0 - 100 MG/DL    VLDL, calculated 8.6 MG/DL    CHOL/HDL Ratio 1.8 0 - 5.0     METABOLIC PANEL, COMPREHENSIVE   Result Value Ref Range    Sodium 142 136 - 145 mmol/L    Potassium 3.6 3.5 - 5.5 mmol/L    Chloride 105 100 - 108 mmol/L    CO2 29 21 - 32 mmol/L    Anion gap 8 3.0 - 18 mmol/L     Glucose 103 (H) 74 - 99 mg/dL    BUN 25 (H) 7.0 - 18 MG/DL    Creatinine 1.27 0.6 - 1.3 MG/DL    BUN/Creatinine ratio 20 12 - 20      GFR est AA >60 >60 ml/min/1.45m    GFR est non-AA 56 (L) >60 ml/min/1.748m   Calcium 9.0 8.5 - 10.1 MG/DL    Bilirubin, total 0.5 0.2 - 1.0 MG/DL    ALT (SGPT) 24 16 - 61 U/L    AST (SGOT) 12 (L) 15 - 37 U/L    Alk. phosphatase 58 45 - 117 U/L    Protein, total 6.8 6.4 - 8.2 g/dL    Albumin 3.7 3.4 - 5.0 g/dL    Globulin 3.1 2.0 - 4.0 g/dL    A-G Ratio 1.2 0.8 - 1.7     PSA SCREENING (SCREENING)   Result Value Ref Range    Prostate Specific Ag 2.8 0.0 - 4.0 ng/mL     Assessment: #1.  History of elevated PSA now down into the normal range last week and 6 months ago.  I will be rechecked one more time and then go back to yearly PSA evaluations.  #2.  ASHD asymptomatic.  He will continue aspirin and carvedilol.  #3.  Hyperlipidemia doing well.  He will continue simvastatin 40 mg each evening and work to get weight back down.  #4.    Hypertension is controlled.  He will continue Hyzaar and amlodipine.    Follow-up in 6 months for a physical or sooner if needed    Jahred Tatar R. MaCelesta AverMD FACP    Please note:  This document has been produced using voice recognition software. Unrecognized errors in transcription may be present.

## 2015-09-28 ENCOUNTER — Ambulatory Visit: Admit: 2015-09-28 | Payer: PRIVATE HEALTH INSURANCE | Attending: Cardiovascular Disease | Primary: Internal Medicine

## 2015-09-28 DIAGNOSIS — I2510 Atherosclerotic heart disease of native coronary artery without angina pectoris: Secondary | ICD-10-CM

## 2015-09-28 NOTE — Progress Notes (Signed)
1. Have you been to the ER, urgent care clinic since your last visit?  Hospitalized since your last visit? no  2. Have you seen or consulted any other health care providers outside of the [facility] since your last visit?  Include any pap smears or colon screening.  no

## 2015-09-28 NOTE — Patient Instructions (Signed)
Continue current medications.   If you have any further questions or concerns, please contact our office. [phone]

## 2015-09-28 NOTE — Progress Notes (Signed)
HISTORY OF PRESENT ILLNESS  Raymond [name] is a 70 y.o. male.    HPI  He has been doing very well.  Other than mild leg edema, he has no complaints.  He has had no chest pain, dyspnea, orthopnea or PND.  He has had no palpitations, dizziness or syncope.  He has had no symptoms to indicate claudication, TIA or amaurosis fugax.  His cholesterol profile has been satisfactory.             He underwent a noninvasive carotid study in February of 2017, which demonstrated less than 50% stenosis in the right internal carotid artery and no significant stenosis of the left internal carotid artery.         He has a history of known coronary artery disease and had an acute inferior wall myocardial infarction on October 12, 2001, and had stenting of the right coronary artery at that time. In January 2006, he started experiencing frequent palpitations and dizziness with a feeling as if he was dropping down on a roller coaster. He was admitted to [facility] on February 27, 2004 with an episode of the sinking feeling and nonsustained ventricular tachycardia. He subsequently underwent cardiac catheterization followed by CABG X3 on March 03, 2004, which consisted of:   1. LIMA to the LAD.   2. Sequential SVG to the diagonal and obtuse marginal branch.   His preoperative EF was in the 45% range. He was treated with amiodarone for nonsustained ventricular tachycardia and has had no recurrence of the sinking feeling or palpitations since the bypass surgery. He developed a large right pleural effusion which was once drained, but he has had some reaccumulation. Because of the sinking feeling, he had a 24-hour Holter monitor which failed to reveal a significant cardiac arrhythmia other than PVCs. He did have the sinking feeling while he was wearing the monitor, but it did not coincide with any significant ventricular arrhythmia.   He had the follow up stress nuclear cardiac imaging on 02/17/2010 which  demonstrated fixed defect involving the inferior base with no significant ischemia. Ejection fraction was estimated at 55%.  He underwent the follow up stress nuclear cardiac imaging on 04/11/13 which demonstrated a small to moderate sized fixed defect in the basal inferior and anterolateral wall with very mild reversibility in the inferolateral wall. Because of a gating error the ejection fraction was not calculated.  He reported that the Coreg has been reduced to 6.25 mg twice a day by [employer]. Jill Poling, MD because of the bradycardia.    Review of Systems   Constitutional: Negative for malaise/fatigue and weight loss.   HENT: Negative for hearing loss.    Eyes: Negative for blurred vision and double vision.   Respiratory: Negative for shortness of breath.    Cardiovascular: Positive for leg swelling. Negative for chest pain, palpitations, orthopnea, claudication and PND.   Gastrointestinal: Negative for blood in stool, heartburn and melena.   Genitourinary: Negative for dysuria, frequency, hematuria and urgency.   Musculoskeletal: Negative for back pain and joint pain.   Skin: Negative for itching and rash.   Neurological: Negative for dizziness, loss of consciousness and weakness.   Psychiatric/Behavioral: Negative for depression and memory loss.       Physical Exam   Constitutional: He is oriented to person, place, and time. He appears well-developed and well-nourished.   HENT:   Head: Normocephalic and atraumatic.   Eyes: Conjunctivae are normal. Pupils are equal, round, and reactive to light.  Neck: Normal range of motion. Neck supple. No JVD present.   Cardiovascular: Regular rhythm, S1 normal and S2 normal.   No extrasystoles are present. Bradycardia present.  PMI is not displaced.  Exam reveals no gallop and no friction rub.    No murmur heard.  Pulses:       Carotid pulses are 3+ on the right side, and 3+ on the left side.  Pulmonary/Chest: Effort normal. He has no rales.    Abdominal: Soft. There is no tenderness.   Musculoskeletal: He exhibits edema.   trace   Neurological: He is alert and oriented to person, place, and time. No cranial nerve deficit.   Skin: Skin is warm and dry.   Psychiatric: He has a normal mood and affect. His behavior is normal.     Visit Vitals   ??? BP 120/70   ??? Pulse (!) 54   ??? Ht 5\' 9"  (1.753 m)   ??? Wt 86.6 kg (191 lb)   ??? SpO2 97%   ??? BMI 28.21 kg/m2       Past Medical History:   Diagnosis Date   ??? ASHD (arteriosclerotic heart disease)    ??? CABG x 3 02/2004   ??? Cardiac cath 02/29/2004    oRCA 100%.  LM patent.  p/mLAD 85%.  D1 90%.  D2 90%.  CX 45%.  LVEDP 12 mmHg.  EF 45%.  CABG recommended.   ??? Cardiac echocardiogram 11/08/2005    LVE.  EF 45-50%.  Inferior, inferolateral WMA.     ??? Cardiac nuclear imaging test, mod risk 04/11/2013    Intermediate risk.  Mild-mod basal inferior infarction w/mild-mod peri-infarct ischemia in mid inferior, prox & mid inferolateral walls.  EF unreadable due to gating error.  Neg EKG on max EST.  Ex time 6 min.     ??? Cardiovascular RLE venous duplex 08/05/2013    Right leg:  No DVT.   ??? Coronary artery disease     inferior wall myocardial infarction and stenting of the RCA 9/03; Status post CABG X3 in January 2006.   ??? Elevated prostate specific antigen (PSA)    ??? Hypercholesteremia     low HDL   ??? Hypertension    ??? Hypertension    ??? Hypertrophy of prostate with urinary obstruction and other lower urinary tract symptoms (LUTS)    ??? Microscopic hematuria    ??? 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 Pleural effusion on right    ??? Right renal cyst    ??? Ventricular tachycardia, nonsustained (HCC)        Social History     Social History   ??? Marital status: MARRIED     Spouse name: N/A   ??? Number of children: N/A   ??? Years of education: N/A     Occupational History   ??? Not on file.     Social History Main Topics   ??? Smoking status: Never Smoker   ??? Smokeless tobacco: Never Used   ??? Alcohol use Yes       Comment: social   ??? Drug use: No   ??? Sexual activity: Not on file     Other Topics Concern   ??? Not on file     Social History Narrative       Family History   Problem Relation Age of Onset   ??? Heart Disease Father      x3 heart bypass   ??? Arthritis-osteo  Mother    ??? Hypertension Sister        Past Surgical History:   Procedure Laterality Date   ??? HX CORONARY ARTERY BYPASS GRAFT  03/03/04    LIMA to the LAD. Sequential SVG to the diagonal and obtuse marginal branch   ??? HX CORONARY STENT PLACEMENT  10/12/01    RCA stented using 3.5 x 13 mm Express 2 stent   ??? HX HEART CATHETERIZATION  03/03/04    followed by CABG X3   ??? HX TONSILLECTOMY     ??? STENT INSERTION  10/12/01    right coronary artery   ??? VASCULAR SURGERY PROCEDURE UNLIST      biopsy of mass in abdomen, benign       Current Outpatient Prescriptions   Medication Sig Dispense Refill   ??? omega-3 acid ethyl esters (LOVAZA) 1 gram capsule TAKE 1 CAPSULE BY MOUTH EVERY DAY WITH BREAKFAST 30 Cap 0   ??? carvedilol (COREG) 6.25 mg tablet TAKE 1 TABLET BY MOUTH TWICE DAILY WITH MEALS 180 Tab 0   ??? potassium chloride (K-DUR, KLOR-CON) 10 mEq tablet Take 1 Tab by mouth daily. 30 Tab 6   ??? simvastatin (ZOCOR) 40 mg tablet Take 1 Tab by mouth nightly. 30 Tab 6   ??? amLODIPine (NORVASC) 5 mg tablet Take 1 Tab by mouth daily. 90 Tab 3   ??? losartan-hydroCHLOROthiazide (HYZAAR) 100-12.5 mg per tablet Take 1 Tab by mouth daily. 90 Tab 2   ??? triamcinolone (NASACORT AQ) 55 mcg nasal inhaler INSTILL 2 SPRAYS INTO EACH NOSTRIL DAILY 17 g 8   ??? aspirin 81 mg tablet Take 81 mg by mouth daily.     ??? MULTIVITAMIN PO Take 1 Tab by mouth daily.         EKG: unchanged from previous tracings, sinus bradycardia, RBBB, inf.scar  .  ASSESSMENT and PLAN  Encounter Diagnoses   Name Primary?   ??? CAD, history of inferior wall myocardial infarction/status post stenting of the RCA in September 2003/status post CABG X3 in January 2006/EF 45%. Yes   ??? Nonsustained ventricular tachycardia (HCC)     ??? Hyperlipidemia LDL goal <70    He has been doing very well.  He has had no symptoms to indicate angina or cardiac decompensation.  His blood pressure has been under control.  His cholesterol profile has been very satisfactory.  His mild leg edema is from the calcium channel blockade, but not severe enough to discontinue medication at this time.  The patient was reassured.  He will have a followup stress test in six months or so.

## 2015-09-29 MED ORDER — OMEGA-3 ACID ETHYL ESTERS 1 GRAM CAP
1 gram | ORAL_CAPSULE | ORAL | 0 refills | Status: DC
Start: 2015-09-29 — End: 2015-10-30

## 2015-10-25 MED ORDER — LOSARTAN-HYDROCHLOROTHIAZIDE 100 MG-12.5 MG TAB
ORAL_TABLET | Freq: Every day | ORAL | 2 refills | Status: DC
Start: 2015-10-25 — End: 2016-07-15

## 2015-10-25 NOTE — Telephone Encounter (Signed)
From: Raymond [name]  To: [name], MD  Sent: 10/23/2015 12:53 PM EDT  Subject:  Medication Renewal Request    Original  authorizing provider: [name], MD    Raymond [name] would like a refill of the following medications:  losartan-hydroCHLOROthiazide  (HYZAAR) 100-12.5 mg per tablet [name], MD]    Preferred  pharmacy: [id number] - [place], [place] - [address]  [address]    Comment:

## 2015-10-25 NOTE — Telephone Encounter (Signed)
Last office visit 09/16/2015  Last refill 01/26/2015

## 2015-11-01 MED ORDER — OMEGA-3 ACID ETHYL ESTERS 1 GRAM CAP
1 gram | ORAL_CAPSULE | ORAL | 0 refills | Status: DC
Start: 2015-11-01 — End: 2015-11-25

## 2015-11-08 MED ORDER — CARVEDILOL 6.25 MG TAB
6.25 mg | ORAL_TABLET | Freq: Two times a day (BID) | ORAL | 0 refills | Status: DC
Start: 2015-11-08 — End: 2016-02-03

## 2015-11-08 MED ORDER — SIMVASTATIN 40 MG TAB
40 mg | ORAL_TABLET | Freq: Every evening | ORAL | 6 refills | Status: DC
Start: 2015-11-08 — End: 2016-06-09

## 2015-11-08 NOTE — Telephone Encounter (Signed)
From: Raymond Cookeyavid F Sanjose  To: Raymond Sloopoderick R Mackinnon, MD  Sent: 11/08/2015 2:10 PM EDT  Subject:  Medication Renewal Request    Original  authorizing provider: Normand Sloopoderick R Mackinnon, MD    Raymond Cookeyavid  F. [name] would like a refill of the following medications:  carvedilol  (COREG) 6.25 mg tablet Raymond Sloop[[name], MD]    [id number] - [place], [place] - [address]  [address]    Comment:      Medication  renewals requested in this message routed to other providers:  simvastatin  (ZOCOR) 40 mg tablet Byrd Hesselbach[[name], NP]

## 2015-11-08 NOTE — Telephone Encounter (Signed)
From: Raymond Cookeyavid F Hanback  To: Corinna CapraMaria Elizabeth P Pagtalunan, NP  Sent: 11/08/2015 2:10 PM EDT  Subject:  Medication Renewal Request    Original  authorizing provider: Corinna CapraMaria Elizabeth P Pagtalunan, NP    Raymond Cookeyavid  F. [name] would like a refill of the following medications:  simvastatin  (ZOCOR) 40 mg tablet Raymond Hesselbach[[name], NP]    [id number] - [place], [place] - [address]  [address]    Comment:      Medication  renewals requested in this message routed to other providers:  carvedilol  (COREG) 6.25 mg tablet [name][[name], MD]

## 2015-11-08 NOTE — Telephone Encounter (Signed)
Last ov 09/16/15

## 2015-11-26 MED ORDER — OMEGA-3 ACID ETHYL ESTERS 1 GRAM CAP
1 gram | ORAL_CAPSULE | ORAL | 0 refills | Status: DC
Start: 2015-11-26 — End: 2015-12-25

## 2015-12-27 MED ORDER — OMEGA-3 ACID ETHYL ESTERS 1 GRAM CAP
1 gram | ORAL_CAPSULE | ORAL | 0 refills | Status: DC
Start: 2015-12-27 — End: 2016-01-24

## 2016-01-03 MED ORDER — POTASSIUM CHLORIDE SR 10 MEQ TAB, PARTICLES/CRYSTALS
10 mEq | ORAL_TABLET | Freq: Every day | ORAL | 3 refills | Status: DC
Start: 2016-01-03 — End: 2016-09-25

## 2016-01-24 MED ORDER — OMEGA-3 ACID ETHYL ESTERS 1 GRAM CAP
1 gram | ORAL_CAPSULE | ORAL | 0 refills | Status: DC
Start: 2016-01-24 — End: 2016-02-18

## 2016-02-03 MED ORDER — CARVEDILOL 6.25 MG TAB
6.25 mg | ORAL_TABLET | ORAL | 0 refills | Status: DC
Start: 2016-02-03 — End: 2016-05-01

## 2016-02-18 MED ORDER — OMEGA-3 ACID ETHYL ESTERS 1 GRAM CAP
1 gram | ORAL_CAPSULE | ORAL | 0 refills | Status: DC
Start: 2016-02-18 — End: 2016-03-27

## 2016-03-10 ENCOUNTER — Encounter

## 2016-03-13 ENCOUNTER — Inpatient Hospital Stay
Admit: 2016-03-13 | Discharge: 2016-03-13 | Payer: BLUE CROSS/BLUE SHIELD | Attending: Cardiovascular Disease | Primary: Internal Medicine

## 2016-03-13 DIAGNOSIS — I2510 Atherosclerotic heart disease of native coronary artery without angina pectoris: Secondary | ICD-10-CM

## 2016-03-13 DIAGNOSIS — R001 Bradycardia, unspecified: Secondary | ICD-10-CM

## 2016-03-13 LAB — NUCLEAR STRESS TEST
Functional capacity: ADEQUATE
Max. Diastolic BP: 60 mmHg
Max. Heart rate: 130 {beats}/min
Max. Systolic BP: 152 mmHg
Peak Ex METs: 7 METS

## 2016-03-13 MED ORDER — REGADENOSON 0.4 MG/5 ML IV SYRINGE
0.4 mg/5 mL | Freq: Once | INTRAVENOUS | Status: DC
Start: 2016-03-13 — End: 2016-03-13

## 2016-03-13 MED ORDER — SODIUM CHLORIDE 0.9 % IV
Freq: Once | INTRAVENOUS | Status: AC
Start: 2016-03-13 — End: 2016-03-13
  Administered 2016-03-13: 15:00:00 via INTRAVENOUS

## 2016-03-13 MED FILL — SODIUM CHLORIDE 0.9 % IV: INTRAVENOUS | Qty: 1000

## 2016-03-13 NOTE — Progress Notes (Signed)
Patient was given 10.53 milliCuries of 4899mTc-Sestamibi for the resting images.  Patient was also given 33.0 milliCuries of 8499mTc-Sestamibi for the stress images.  Patient walked on treadmill during nuclear stress test.    Patient's armband was discarded and shredded.

## 2016-03-13 NOTE — Progress Notes (Signed)
CAD, history of inferior wall myocardial infarction/status post stenting of the RCA in September 2003/status post CABG X3 in January 2006/EF 45%. Yes  ??? Nonsustained ventricular tachycardia (HCC) ??  ??? Hyperlipidemia LDL goal <70 ??  He has been doing very well.  He has had no symptoms to indicate angina or cardiac decompensation.  His blood pressure has been under control.  His cholesterol profile has been very satisfactory.  His mild leg edema is from the calcium channel blockade, but not severe enough to discontinue medication at this time.  The patient was reassured.  He will have a followup stress test in six months or so.

## 2016-03-13 NOTE — Progress Notes (Signed)
Remind me on 2/19

## 2016-03-13 NOTE — Procedures (Signed)
[facility]  NUC MED CARDIAC STRESS    Name:Raymond [name], Raymond [name].  MR#: [id number]  DOB: 06/29/1945  ACCOUNT #: [id number]   DATE OF SERVICE: 03/13/2016    PROCEDURE:  Exercise nuclear stress test with gated SPECT imaging.    REFERRING PHYSICIAN:  Dae Chough    INDICATION:  Coronary artery disease.    Resting heart rate was 52, blood pressure 148/80.  Resting EKG shows sinus bradycardia, right bundle branch block, inferior and lateral Q-waves consistent with prior myocardial infarction.  There were no ST segment changes concerning for ischemia.    EXERCISE PORTION:  The patient exercised for a total of 6 minutes and 0 seconds using the standard Bruce protocol.  He achieved a maximum heart of 129 beats per minute, corresponding to 86% of his maximum predicted heart rate.  Maximal blood pressure was 152/60.  Total workload was 6.0 METS.  Test terminated due to fatigue after target heart rate was achieved. The patient did not have any significant ST segment depression concerning for ischemia.  There were no symptoms of chest pain during exercise.  He did have occasional PVCs throughout the test.  Overall, this is a negative EKG portion of the stress test.    PERFUSION IMAGING:  The patient received intravenous technetium 99m sestamibi, 10.5 mCi intravenously per protocol via the left antecubital IV for resting images and then 33 mCi.  An hour later, peak exercise for stress images.  Tomographic views of the left ventricle obtained and compared.  The cavity size was normal during both rest and stress imaging.  There was a small to medium-size,    DICTATION ENDS HERE      Noretta Frier, MD       MR / DN  D: 03/13/2016 12:26     T: 03/13/2016 13:17  JOB #: [id number]

## 2016-03-13 NOTE — Procedures (Signed)
[facility]  [facility]    Name:Shishido, Girolamo F.  MR#: [id number]  DOB: [birth year]  ACCOUNT #: [id number]   DATE OF SERVICE: 03/13/2016    DATE OF STUDY:  03/13/2016     This is an exercise nuclear stress.      ORDERING PHYSICIAN:  Dr. Garwin Brothersae Chough.    INDICATION:  Coronary artery disease.    Resting heart rate was 52, blood pressure 140/80.  Resting EKG showed sinus bradycardia with inferior and lateral Q-waves consistent with prior infarct and a right bundle branch block with no ST or T-wave changes.    EXERCISE STRESS PORTION:  The patient exercised a total of 6 minutes and 0 seconds using standard protocol.      INTERPRETATION:  She achieved a maximum heart rate of 129 beats per minute corresponding to 86% of maximum predicted heart rate.  Maximum blood pressure achieved was 152/60.  Total workload was 6.0 METS.  Test terminated due to fatigue.  There was no ST segment changes concerning for ischemia, there were occasional PVCs throughout the test, otherwise this has been negative EKG portion of the stress test.    PERFUSION IMAGING:  The patient received intravenous technetium 99 sestamibi 10.5 mCi intravenously per protocol via the left AC IV for resting images and 30 mCi at peak exercise for stress images.  Tomographic views of the left ventricle were obtained and compared.  Cavity size was normal.  No change in ischemic dilatation present.  There was a small to medium sized fixed perfusion imaging abnormality involving the mid and distal portion of the inferior wall consistent with prior infarct.  There is also a milder, but fixed medium size inferolateral perfusion defect without reversibility also concerning for a prior area of infarction.  There was no reversible perfusion imaging abnormalities concerning for myocardial ischemia.  Gated analysis demonstrates normal LV size, low normal systolic function, ejection fraction calculated at 52% with both  inferior and inferolateral hypokinesis.    CONCLUSIONS:  1.  Abnormal intermediate risk pharmacological nuclear stress test.  2.  A fixed perfusion defect involving the mid and basal inferior wall consistent with prior infarct.  3.  Mild fixed inferolateral perfusion defect also consistent with prior area of infarct.  4.  No reversible perfusion imaging abnormalities concerning for myocardial ischemia.  5.  Normal LV size, low normal systolic function, ejection fraction calculated at 52% with above-mentioned regional wall motion abnormalities.    Compared to previous study dated 04/11/2013 the perfusion pattern appears to be mostly unchanged, ejection fraction cannot be calculated on the previous study.      Merian CapronMARC Jeferson Boozer, MD       MR / CN  D: 03/13/2016 12:30     T: 03/13/2016 13:07  JOB #: [id number]  CC: Arnell AsalODERICK MACKINNON MD

## 2016-03-13 NOTE — Procedures (Signed)
[facility]  [facility]    Name:[name], Raymond F.  MR#: [id number]  DOB: 12/18/1945  ACCOUNT #: [id number]   DATE OF SERVICE: 03/13/2016    DATE OF STUDY:  03/13/2016     This is an exercise nuclear stress.      ORDERING PHYSICIAN:  Dr. Garwin Brothersae Chough.    INDICATION:  Coronary artery disease.    Resting heart rate was 52, blood pressure 140/80.  Resting EKG showed sinus bradycardia with inferior and lateral Q-waves consistent with prior infarct and a right bundle branch block with no ST or T-wave changes.    EXERCISE STRESS PORTION:  The patient exercised a total of 6 minutes and 0 seconds using standard protocol.      INTERPRETATION:  She achieved a maximum heart rate of 129 beats per minute corresponding to 86% of maximum predicted heart rate.  Maximum blood pressure achieved was 152/60.  Total workload was 6.0 METS.  Test terminated due to fatigue.  There was no ST segment changes concerning for ischemia, there were occasional PVCs throughout the test, otherwise this has been negative EKG portion of the stress test.    PERFUSION IMAGING:  The patient received intravenous technetium 99 sestamibi 10.5 mCi intravenously per protocol via the left AC IV for resting images and 30 mCi at peak exercise for stress images.  Tomographic views of the left ventricle were obtained and compared.  Cavity size was normal.  No change in ischemic dilatation present.  There was a small to medium sized fixed perfusion imaging abnormality involving the mid and distal portion of the inferior wall consistent with prior infarct.  There is also a milder, but fixed medium size inferolateral perfusion defect without reversibility also concerning for a prior area of infarction.  There was no reversible perfusion imaging abnormalities concerning for myocardial ischemia.  Gated analysis demonstrates normal LV size, low normal systolic function, ejection fraction calculated at 52% with both inferior and inferolateral  hypokinesis.    CONCLUSIONS:  1.  Abnormal intermediate risk pharmacological nuclear stress test.  2.  A fixed perfusion defect involving the mid and basal inferior wall consistent with prior infarct.  3.  Mild fixed inferolateral perfusion defect also consistent with prior area of infarct.  4.  No reversible perfusion imaging abnormalities concerning for myocardial ischemia.  5.  Normal LV size, low normal systolic function, ejection fraction calculated at 52% with above-mentioned regional wall motion abnormalities.    Compared to previous study dated 04/11/2013 the perfusion pattern appears to be mostly unchanged, ejection fraction cannot be calculated on the previous study.      Raymond CapronMARC Dearius Hoffmann, MD       MR / CN  D: 03/13/2016 12:30     T: 03/13/2016 13:07  JOB #: [id number]  CC: Arnell AsalODERICK MACKINNON MD

## 2016-03-13 NOTE — Procedures (Signed)
[facility]  [facility] STRESS    Name:Raymond [name], Raymond [name].  MR#: [id number]  DOB: Jan 05, 1946  ACCOUNT #: [id number]   DATE OF SERVICE: 03/13/2016    PROCEDURE:  Exercise nuclear stress test with gated SPECT imaging.    REFERRING PHYSICIAN:  Dae Chough    INDICATION:  Coronary artery disease.    Resting heart rate was 52, blood pressure 148/80.  Resting EKG shows sinus bradycardia, right bundle branch block, inferior and lateral Q-waves consistent with prior myocardial infarction.  There were no ST segment changes concerning for ischemia.    EXERCISE PORTION:  The patient exercised for a total of 6 minutes and 0 seconds using the standard [facility] protocol.  He achieved a maximum heart of 129 beats per minute, corresponding to 86% of his maximum predicted heart rate.  Maximal blood pressure was 152/60.  Total workload was 6.0 METS.  Test terminated due to fatigue after target heart rate was achieved. The patient did not have any significant ST segment depression concerning for ischemia.  There were no symptoms of chest pain during exercise.  He did have occasional PVCs throughout the test.  Overall, this is a negative EKG portion of the stress test.    PERFUSION IMAGING:  The patient received intravenous technetium 6054m sestamibi, 10.5 mCi intravenously per protocol via the left antecubital IV for resting images and then 33 mCi.  An hour later, peak exercise for stress images.  Tomographic views of the left ventricle obtained and compared.  The cavity size was normal during both rest and stress imaging.  There was a small to medium-size,    DICTATION ENDS HERE      Merian CapronMARC Zylpha Poynor, MD       MR / DN  D: 03/13/2016 12:26     T: 03/13/2016 13:17  JOB #: [id number]

## 2016-03-20 ENCOUNTER — Inpatient Hospital Stay: Admit: 2016-03-20 | Payer: BLUE CROSS/BLUE SHIELD | Primary: Internal Medicine

## 2016-03-20 ENCOUNTER — Other Ambulatory Visit: Admit: 2016-03-20 | Discharge: 2016-03-20 | Payer: PRIVATE HEALTH INSURANCE | Primary: Internal Medicine

## 2016-03-20 DIAGNOSIS — I10 Essential (primary) hypertension: Secondary | ICD-10-CM

## 2016-03-20 LAB — CBC WITH AUTOMATED DIFF
ABS. BASOPHILS: 0.1 10*3/uL — ABNORMAL HIGH (ref 0.0–0.06)
ABS. EOSINOPHILS: 0.2 10*3/uL (ref 0.0–0.4)
ABS. LYMPHOCYTES: 1.4 10*3/uL (ref 0.9–3.6)
ABS. MONOCYTES: 0.5 10*3/uL (ref 0.05–1.2)
ABS. NEUTROPHILS: 3.1 10*3/uL (ref 1.8–8.0)
BASOPHILS: 1 % (ref 0–2)
EOSINOPHILS: 4 % (ref 0–5)
HCT: 41.6 % (ref 36.0–48.0)
HGB: 13.6 g/dL (ref 13.0–16.0)
LYMPHOCYTES: 27 % (ref 21–52)
MCH: 30.8 PG (ref 24.0–34.0)
MCHC: 32.7 g/dL (ref 31.0–37.0)
MCV: 94.3 FL (ref 74.0–97.0)
MONOCYTES: 9 % (ref 3–10)
MPV: 10.3 FL (ref 9.2–11.8)
NEUTROPHILS: 59 % (ref 40–73)
PLATELET: 177 10*3/uL (ref 135–420)
RBC: 4.41 M/uL — ABNORMAL LOW (ref 4.70–5.50)
RDW: 13.9 % (ref 11.6–14.5)
WBC: 5.2 10*3/uL (ref 4.6–13.2)

## 2016-03-21 LAB — METABOLIC PANEL, COMPREHENSIVE
A-G Ratio: 1.2 (ref 0.8–1.7)
ALT (SGPT): 29 U/L (ref 16–61)
AST (SGOT): 13 U/L — ABNORMAL LOW (ref 15–37)
Albumin: 3.7 g/dL (ref 3.4–5.0)
Alk. phosphatase: 50 U/L (ref 45–117)
Anion gap: 11 mmol/L (ref 3.0–18)
BUN/Creatinine ratio: 16 (ref 12–20)
BUN: 21 MG/DL — ABNORMAL HIGH (ref 7.0–18)
Bilirubin, total: 0.5 MG/DL (ref 0.2–1.0)
CO2: 26 mmol/L (ref 21–32)
Calcium: 9 MG/DL (ref 8.5–10.1)
Chloride: 107 mmol/L (ref 100–108)
Creatinine: 1.33 MG/DL — ABNORMAL HIGH (ref 0.6–1.3)
GFR est AA: >60 mL/min/{1.73_m2} (ref >60)
GFR est non-AA: 53 mL/min/{1.73_m2} — ABNORMAL LOW (ref >60)
Globulin: 3.1 g/dL (ref 2.0–4.0)
Glucose: 104 mg/dL — ABNORMAL HIGH (ref 74–99)
Potassium: 3.8 mmol/L (ref 3.5–5.5)
Protein, total: 6.8 g/dL (ref 6.4–8.2)
Sodium: 144 mmol/L (ref 136–145)

## 2016-03-21 LAB — TSH 3RD GENERATION: TSH: 3.09 u[IU]/mL (ref 0.36–3.74)

## 2016-03-21 LAB — LIPID PANEL
CHOL/HDL Ratio: 2.1 (ref 0–5.0)
Cholesterol, total: 121 MG/DL (ref <200)
HDL Cholesterol: 58 MG/DL (ref 40–60)
LDL, calculated: 52.8 MG/DL (ref 0–100)
Triglyceride: 51 MG/DL (ref <150)
VLDL, calculated: 10.2 MG/DL

## 2016-03-21 LAB — MICROALBUMIN, UR, RAND W/ MICROALB/CREAT RATIO
Creatinine, urine random: 227.87 mg/dL — ABNORMAL HIGH (ref 30–125)
Microalbumin,urine random: 1.3 MG/DL (ref 0–3.0)
Microalbumin/Creat ratio (mg/g creat): 6 mg/g (ref 0–30)

## 2016-03-21 LAB — T4, FREE: T4, Free: 1.2 NG/DL (ref 0.7–1.5)

## 2016-03-22 LAB — HEMOGLOBIN A1C WITH EAG
Est. average glucose: 114 mg/dL
Hemoglobin A1c: 5.6 % (ref 4.2–5.6)

## 2016-03-27 ENCOUNTER — Ambulatory Visit
Admit: 2016-03-27 | Discharge: 2016-03-27 | Payer: PRIVATE HEALTH INSURANCE | Attending: Internal Medicine | Primary: Internal Medicine

## 2016-03-27 ENCOUNTER — Ambulatory Visit
Admit: 2016-03-27 | Discharge: 2016-03-27 | Payer: PRIVATE HEALTH INSURANCE | Attending: Cardiovascular Disease | Primary: Internal Medicine

## 2016-03-27 DIAGNOSIS — R001 Bradycardia, unspecified: Secondary | ICD-10-CM

## 2016-03-27 DIAGNOSIS — I2510 Atherosclerotic heart disease of native coronary artery without angina pectoris: Secondary | ICD-10-CM

## 2016-03-27 MED ORDER — AMLODIPINE 5 MG TAB
5 mg | ORAL_TABLET | Freq: Every day | ORAL | 3 refills | Status: DC
Start: 2016-03-27 — End: 2016-09-25

## 2016-03-27 MED ORDER — OMEGA-3 ACID ETHYL ESTERS 1 GRAM CAP
1 gram | ORAL_CAPSULE | Freq: Two times a day (BID) | ORAL | 3 refills | Status: DC
Start: 2016-03-27 — End: 2016-03-28

## 2016-03-27 NOTE — Progress Notes (Signed)
Raymond [name], doyon August 29, 1945, is a 71 y.o. male, who is seen today for reevaluation of atherosclerotic heart disease, hypertension, hyperlipidemia, allergic rhinitis and history of chronic low back pain.  He has had chronic low back pain for many years but over the last few months has noticed it to be a little more prominent and it is in the mid to lower lumbar area radiating more to the right than to the left but not into the buttocks or legs.  He says that when this comes on in more recent years it last longer.  He does note that when he first gets up in the morning his urine is slow especially seated on the commode.  He has some belching but rarely reflux.  He does have reflux in the middle the night he uses ranitidine and says that works better than antacids.    Past Medical History:   Diagnosis Date   ??? ASHD (arteriosclerotic heart disease)    ??? CABG x 3 02/2004   ??? Cardiac cath 02/29/2004    oRCA 100%.  LM patent.  p/mLAD 85%.  D1 90%.  D2 90%.  CX 45%.  LVEDP 12 mmHg.  EF 45%.  CABG recommended.   ??? Cardiac echocardiogram 11/08/2005    LVE.  EF 45-50%.  Inferior, inferolateral WMA.     ??? Cardiac nuclear imaging test, mod risk 04/11/2013    Intermediate risk.  Mild-mod basal inferior infarction w/mild-mod peri-infarct ischemia in mid inferior, prox & mid inferolateral walls.  EF unreadable due to gating error.  Neg EKG on max EST.  Ex time 6 min.     ??? Cardiovascular RLE venous duplex 08/05/2013    Right leg:  No DVT.   ??? Coronary artery disease     inferior wall myocardial infarction and stenting of the RCA 9/03; Status post CABG X3 in January 2006.   ??? Elevated prostate specific antigen (PSA)    ??? Hypercholesteremia     low HDL   ??? Hypertension    ??? Hypertension    ??? Hypertrophy of prostate with urinary obstruction and other lower urinary tract symptoms (LUTS)    ??? Microscopic hematuria    ??? Myocardial infarction, inferior wall ([place]) 10/12/01    acute   ??? Pleural effusion on right    ??? Right renal cyst     ??? Ventricular tachycardia, nonsustained (H[facility])      Past Surgical History:   Procedure Laterality Date   ??? HX CORONARY ARTERY BYPASS GRAFT  03/03/04    LIMA to the LAD. Sequential SVG to the diagonal and obtuse marginal branch   ??? HX CORONARY STENT PLACEMENT  10/12/01    RCA stented using 3.5 x 13 mm Express 2 stent   ??? HX HEART CATHETERIZATION  03/03/04    followed by CABG X3   ??? HX TONSILLECTOMY     ??? STENT INSERTION  10/12/01    right coronary artery   ??? VASCULAR SURGERY PROCEDURE UNLIST      biopsy of mass in abdomen, benign     Current Outpatient Prescriptions   Medication Sig Dispense Refill   ??? omega-3 acid ethyl esters (LOVAZA) 1 gram capsule TAKE 1 CAPSULE BY MOUTH EVERY DAY WITH BREAKFAST 30 Cap 0   ??? carvedilol (COREG) 6.25 mg tablet TAKE 1 TABLET BY MOUTH TWICE DAILY WITH MEALS 180 Tab 0   ??? potassium chloride (KLOR-CON) 10 mEq tablet Take 1 Tab by mouth daily. 90 Tab 3   ???  simvastatin (ZOCOR) 40 mg tablet Take 1 Tab by mouth nightly. 30 Tab 6   ??? losartan-hydroCHLOROthiazide (HYZAAR) 100-12.5 mg per tablet Take 1 Tab by mouth daily. 90 Tab 2   ??? amLODIPine (NORVASC) 5 mg tablet Take 1 Tab by mouth daily. 90 Tab 3   ??? triamcinolone (NASACORT AQ) 55 mcg nasal inhaler INSTILL 2 SPRAYS INTO EACH NOSTRIL DAILY 17 g 8   ??? aspirin 81 mg tablet Take 81 mg by mouth daily.     ??? MULTIVITAMIN PO Take 1 Tab by mouth daily.       Allergies   Allergen Reactions   ??? Ace Inhibitors Unknown (comments) and Cough     Intolerance b/c of dry coughing   ??? Bee Sting [Sting, Bee] Hives     Social History     Social History   ??? Marital status: MARRIED     Spouse name: N/A   ??? Number of children: N/A   ??? Years of education: N/A     Social History Main Topics   ??? Smoking status: Never Smoker   ??? Smokeless tobacco: Never Used   ??? Alcohol use Yes      Comment: social   ??? Drug use: No   ??? Sexual activity: Not Asked     Other Topics Concern   ??? None     Social History Narrative     Visit Vitals   ??? BP 122/70   ??? Pulse 86    ??? Temp 98.3 ??F (36.8 ??C) (Oral)   ??? Resp 12   ??? Ht 5' 9"  (1.753 m)   ??? Wt 194 lb 3.2 oz (88.1 kg)   ??? SpO2 98%   ??? BMI 28.68 kg/m2     The patient is a well-developed, well-nourished male in no apparent distress. HEENT: Pupils are equal and react to light and extraocular movements are full.  Ear canals and tympanic membranes appear normal. Oral cavity appears normal with no oral lesions. Neck: Carotids are 2+ without bruits. No adenopathy or thyromegaly. Lungs are clear to percussion. I hear no wheezing, rales or rhonchi. Heart reveals a nondisplaced apical impulse in the fifth interspace at the midclavicular line. Rhythm is regular and no murmur, gallop, click or rub. Abdomen is soft and nontender with no hepatosplenomegaly or masses. No distention or tympany and normoactive bowel sounds. Extremities reveal no clubbing cyanosis or edema. Pulses are 2+ throughout. Normal external genitalia with no hernia. Rectal exam is normal.  The prostate is symmetric and smooth with no nodules.   No suspicious skin growths.    Results for orders placed or performed during the hospital encounter of 03/20/16   CBC WITH AUTOMATED DIFF   Result Value Ref Range    WBC 5.2 4.6 - 13.2 K/uL    RBC 4.41 (L) 4.70 - 5.50 M/uL    HGB 13.6 13.0 - 16.0 g/dL    HCT 41.6 36.0 - 48.0 %    MCV 94.3 74.0 - 97.0 FL    MCH 30.8 24.0 - 34.0 PG    MCHC 32.7 31.0 - 37.0 g/dL    RDW 13.9 11.6 - 14.5 %    PLATELET 177 135 - 420 K/uL    MPV 10.3 9.2 - 11.8 FL    NEUTROPHILS 59 40 - 73 %    LYMPHOCYTES 27 21 - 52 %    MONOCYTES 9 3 - 10 %    EOSINOPHILS 4 0 - 5 %    BASOPHILS  1 0 - 2 %    ABS. NEUTROPHILS 3.1 1.8 - 8.0 K/UL    ABS. LYMPHOCYTES 1.4 0.9 - 3.6 K/UL    ABS. MONOCYTES 0.5 0.05 - 1.2 K/UL    ABS. EOSINOPHILS 0.2 0.0 - 0.4 K/UL    ABS. BASOPHILS 0.1 (H) 0.0 - 0.06 K/UL    DF AUTOMATED     LIPID PANEL   Result Value Ref Range    LIPID PROFILE          Cholesterol, total 121 <200 MG/DL    Triglyceride 51 <150 MG/DL     HDL Cholesterol 58 40 - 60 MG/DL    LDL, calculated 52.8 0 - 100 MG/DL    VLDL, calculated 10.2 MG/DL    CHOL/HDL Ratio 2.1 0 - 5.0     METABOLIC PANEL, COMPREHENSIVE   Result Value Ref Range    Sodium 144 136 - 145 mmol/L    Potassium 3.8 3.5 - 5.5 mmol/L    Chloride 107 100 - 108 mmol/L    CO2 26 21 - 32 mmol/L    Anion gap 11 3.0 - 18 mmol/L    Glucose 104 (H) 74 - 99 mg/dL    BUN 21 (H) 7.0 - 18 MG/DL    Creatinine 1.33 (H) 0.6 - 1.3 MG/DL    BUN/Creatinine ratio 16 12 - 20      GFR est AA >60 >60 ml/min/1.35m    GFR est non-AA 53 (L) >60 ml/min/1.792m   Calcium 9.0 8.5 - 10.1 MG/DL    Bilirubin, total 0.5 0.2 - 1.0 MG/DL    ALT (SGPT) 29 16 - 61 U/L    AST (SGOT) 13 (L) 15 - 37 U/L    Alk. phosphatase 50 45 - 117 U/L    Protein, total 6.8 6.4 - 8.2 g/dL    Albumin 3.7 3.4 - 5.0 g/dL    Globulin 3.1 2.0 - 4.0 g/dL    A-G Ratio 1.2 0.8 - 1.7     TSH 3RD GENERATION   Result Value Ref Range    TSH 3.09 0.36 - 3.74 uIU/mL   T4, FREE   Result Value Ref Range    T4, Free 1.2 0.7 - 1.5 NG/DL   HEMOGLOBIN A1C WITH EAG   Result Value Ref Range    Hemoglobin A1c 5.6 4.2 - 5.6 %    Est. average glucose 114 mg/dL   MICROALBUMIN, UR, RAND W/ MICROALBUMIN/CREA RATIO   Result Value Ref Range    Microalbumin,urine random 1.30 0 - 3.0 MG/DL    Creatinine, urine 227.87 (H) 30 - 125 mg/dL    Microalbumin/Creat ratio (mg/g creat) 6 0 - 30 mg/g     Assessment: #1.  ASHD asymptomatic, he had a stress test recently and will be seen his cardiologist later today for follow-up on that test.  He will continue aspirin 81 mg daily and carvedilol 6.25 mg twice daily.  #2.  Hypertension well controlled.  He will continue amlodipine and Hyzaar.  #3.  Hyperlipidemia doing well.  He will continue simvastatin 40 mg each evening and he may very safely stop Lovenox at this point.  It was apparently started by her cardiologist in the past in order to elevate HDL,  which has been proven not to be beneficial.  #4.  Lumbar pain  related to the lumbar spine and adjacent muscles with normal exam.  No CVA tenderness.  #5.  Allergic rhinitis which is mainly in the fall, he is  off Nasacort at this time and will restart it when needed in the fall.  #6.  Mild overweight, he will be working on weight loss.    He will receive PPSV 23 now and follow-up in 6 months with lab    [place] R. Celesta Aver, MD FACP    Please note:  This document has been produced using voice recognition software. Unrecognized errors in transcription may be present.

## 2016-03-27 NOTE — Progress Notes (Signed)
HISTORY OF PRESENT ILLNESS  Raymond [name] is a 71 y.o. male.    HPI  He has been feeling well.  He has no cardiac complaints.  He has had no chest pain, dyspnea, orthopnea or PND.  He has had no palpitations, dizziness or syncope.  He has had no symptoms to indicate TIA or amaurosis fugax.  His cholesterol profile has been satisfactory.  He underwent stress nuclear cardiac imaging on 03/13/2016, which demonstrated a fixed defect in the mid and basal inferior wall with no ischemia.  The ejection fraction was estimated at around 52%.      He has a history of known coronary artery disease and had an acute inferior wall myocardial infarction on October 12, 2001, and had stenting of the right coronary artery at that time. In January 2006, he started experiencing frequent palpitations and dizziness with a feeling as if he was dropping down on a roller coaster. He was admitted to [facility] on February 27, 2004 with an episode of the sinking feeling and nonsustained ventricular tachycardia. He subsequently underwent cardiac catheterization followed by CABG X3 on March 03, 2004, which consisted of:   1. LIMA to the LAD.   2. Sequential SVG to the diagonal and obtuse marginal branch.   His preoperative EF was in the 45% range. He was treated with amiodarone for nonsustained ventricular tachycardia and has had no recurrence of the sinking feeling or palpitations since the bypass surgery. He developed a large right pleural effusion which was once drained, but he has had some reaccumulation. Because of the sinking feeling, he had a 24-hour Holter monitor which failed to reveal a significant cardiac arrhythmia other than PVCs. He did have the sinking feeling while he was wearing the monitor, but it did not coincide with any significant ventricular arrhythmia.   He had the follow up stress nuclear cardiac imaging on 02/17/2010 which demonstrated fixed defect involving the inferior base with no significant  ischemia. Ejection fraction was estimated at 55%.  He underwent the follow up stress nuclear cardiac imaging on 04/11/13 which demonstrated a small to moderate sized fixed defect in the basal inferior and anterolateral wall with very mild reversibility in the inferolateral wall. Because of a gating error the ejection fraction was not calculated.  He reported that the Coreg has been reduced to 6.25 mg twice a day by [employer]. Jill Poling, MD because of the bradycardia.  He underwent a noninvasive carotid study in February of 2017, which demonstrated less than 50% stenosis in the right internal carotid artery and no significant stenosis of the left internal carotid artery.     Review of Systems   Constitutional: Negative for malaise/fatigue and weight loss.   HENT: Negative for hearing loss.    Eyes: Negative for blurred vision and double vision.   Respiratory: Negative for shortness of breath.    Cardiovascular: Negative for chest pain, palpitations, orthopnea, claudication, leg swelling and PND.   Gastrointestinal: Negative for blood in stool, heartburn and melena.   Genitourinary: Positive for frequency. Negative for dysuria, hematuria and urgency.   Musculoskeletal: Negative for back pain and joint pain.   Skin: Negative for itching and rash.   Neurological: Negative for dizziness, loss of consciousness and weakness.   Psychiatric/Behavioral: Negative for depression and memory loss.       Physical Exam   Constitutional: He is oriented to person, place, and time. He appears well-developed and well-nourished.   HENT:   Head: Normocephalic and atraumatic.  Eyes: Conjunctivae are normal. Pupils are equal, round, and reactive to light.   Neck: Normal range of motion. Neck supple. No JVD present.   Cardiovascular: Normal rate, regular rhythm, S1 normal and S2 normal.   No extrasystoles are present. PMI is not displaced.  Exam reveals no gallop and no friction rub.    No murmur heard.  Pulses:        Carotid pulses are 3+ on the right side, and 3+ on the left side.  Pulmonary/Chest: Effort normal. He has no rales.   Abdominal: Soft. There is no tenderness.   Musculoskeletal: He exhibits no edema.   Neurological: He is alert and oriented to person, place, and time. No cranial nerve deficit.   Skin: Skin is warm and dry.   Psychiatric: He has a normal mood and affect. His behavior is normal.     Visit Vitals   ??? BP 132/82   ??? Pulse (!) 55   ??? Ht 5\' 9"  (1.753 m)   ??? Wt 88.3 kg (194 lb 9.6 oz)   ??? SpO2 98%   ??? BMI 28.74 kg/m2       Past Medical History:   Diagnosis Date   ??? ASHD (arteriosclerotic heart disease)    ??? CABG x 3 02/2004   ??? Cardiac cath 02/29/2004    oRCA 100%.  LM patent.  p/mLAD 85%.  D1 90%.  D2 90%.  CX 45%.  LVEDP 12 mmHg.  EF 45%.  CABG recommended.   ??? Cardiac echocardiogram 11/08/2005    LVE.  EF 45-50%.  Inferior, inferolateral WMA.     ??? Cardiac nuclear imaging test, mod risk 04/11/2013    Intermediate risk.  Mild-mod basal inferior infarction w/mild-mod peri-infarct ischemia in mid inferior, prox & mid inferolateral walls.  EF unreadable due to gating error.  Neg EKG on max EST.  Ex time 6 min.     ??? Cardiovascular RLE venous duplex 08/05/2013    Right leg:  No DVT.   ??? Coronary artery disease     inferior wall myocardial infarction and stenting of the RCA 9/03; Status post CABG X3 in January 2006.   ??? Elevated prostate specific antigen (PSA)    ??? Hypercholesteremia     low HDL   ??? Hypertension    ??? Hypertension    ??? Hypertrophy of prostate with urinary obstruction and other lower urinary tract symptoms (LUTS)    ??? Microscopic hematuria    ??? 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 Pleural effusion on right    ??? Right renal cyst    ??? Ventricular tachycardia, nonsustained (HCC)        Social History     Social History   ??? Marital status: MARRIED     Spouse name: N/A   ??? Number of children: N/A   ??? Years of education: N/A     Occupational History   ??? Not on file.      Social History Main Topics   ??? Smoking status: Never Smoker   ??? Smokeless tobacco: Never Used   ??? Alcohol use Yes      Comment: social   ??? Drug use: No   ??? Sexual activity: Not on file     Other Topics Concern   ??? Not on file     Social History Narrative       Family History   Problem Relation Age of Onset   ??? Heart Disease  Father      x3 heart bypass   ??? Arthritis-osteo Mother    ??? Hypertension Sister        Past Surgical History:   Procedure Laterality Date   ??? HX CORONARY ARTERY BYPASS GRAFT  03/03/04    LIMA to the LAD. Sequential SVG to the diagonal and obtuse marginal branch   ??? HX CORONARY STENT PLACEMENT  10/12/01    RCA stented using 3.5 x 13 mm Express 2 stent   ??? HX HEART CATHETERIZATION  03/03/04    followed by CABG X3   ??? HX TONSILLECTOMY     ??? STENT INSERTION  10/12/01    right coronary artery   ??? VASCULAR SURGERY PROCEDURE UNLIST      biopsy of mass in abdomen, benign       Current Outpatient Prescriptions   Medication Sig Dispense Refill   ??? omega-3 acid ethyl esters (LOVAZA) 1 gram capsule Take 2 g by mouth two (2) times a day.     ??? carvedilol (COREG) 6.25 mg tablet TAKE 1 TABLET BY MOUTH TWICE DAILY WITH MEALS 180 Tab 0   ??? potassium chloride (KLOR-CON) 10 mEq tablet Take 1 Tab by mouth daily. 90 Tab 3   ??? simvastatin (ZOCOR) 40 mg tablet Take 1 Tab by mouth nightly. 30 Tab 6   ??? losartan-hydroCHLOROthiazide (HYZAAR) 100-12.5 mg per tablet Take 1 Tab by mouth daily. 90 Tab 2   ??? amLODIPine (NORVASC) 5 mg tablet Take 1 Tab by mouth daily. 90 Tab 3   ??? triamcinolone (NASACORT AQ) 55 mcg nasal inhaler INSTILL 2 SPRAYS INTO EACH NOSTRIL DAILY 17 g 8   ??? aspirin 81 mg tablet Take 81 mg by mouth daily.     ??? MULTIVITAMIN PO Take 1 Tab by mouth daily.       history of inferior wall myocardial infarction/status post stenting of the RCA in September 2003/status post CABG X3 in January 2006/EF 45%.  EKG: sinus bradycardia, RBBB, inf.scar,HR slower than 09/28/15  .  ASSESSMENT and PLAN  Encounter Diagnoses    Name Primary?   ??? Bradycardia Yes   ??? CAD of native heart without angina pectoris,history of inferior wall myocardial infarction/status post stenting of the RCA in September 2003/status post CABG X3,in Jan. 2006,EF45-52%     ??? Hyperlipidemia LDL goal <100    ??? Nonsustained ventricular tachycardia (HCC)    He has been doing very well.  He has had no symptoms to indicate angina or cardiac decompensation.  His cholesterol profile has been satisfactory and his recent stress nuclear cardiac imaging demonstrated no ischemia.  His heart rate has become slower than in the past and I will proceed with a Holter monitor for further evaluation of the bradycardia.

## 2016-03-27 NOTE — Progress Notes (Signed)
1. Have you been to the ER, urgent care clinic since your last visit?  Hospitalized since your last visit?No    2. Have you seen or consulted any other health care providers outside of the [facility] since your last visit?  Include any pap smears or colon screening. No

## 2016-03-28 MED ORDER — OMEGA-3 ACID ETHYL ESTERS 1 GRAM CAP
1 gram | ORAL_CAPSULE | Freq: Every day | ORAL | 0 refills | Status: DC
Start: 2016-03-28 — End: 2019-03-19

## 2016-03-28 NOTE — Addendum Note (Signed)
Addended by: Mickey FarberFARMER, Carely Nappier on: 03/28/2016 04:42 PM      Modules accepted: Orders

## 2016-04-03 ENCOUNTER — Inpatient Hospital Stay: Admit: 2016-04-03 | Payer: BLUE CROSS/BLUE SHIELD | Attending: Cardiovascular Disease | Primary: Internal Medicine

## 2016-04-03 DIAGNOSIS — R001 Bradycardia, unspecified: Secondary | ICD-10-CM

## 2016-04-03 NOTE — Progress Notes (Signed)
Called him

## 2016-05-01 MED ORDER — CARVEDILOL 6.25 MG TAB
6.25 mg | ORAL_TABLET | ORAL | 0 refills | Status: DC
Start: 2016-05-01 — End: 2016-07-31

## 2016-06-12 MED ORDER — SIMVASTATIN 40 MG TAB
40 mg | ORAL_TABLET | ORAL | 0 refills | Status: DC
Start: 2016-06-12 — End: 2016-07-07

## 2016-07-07 MED ORDER — SIMVASTATIN 40 MG TAB
40 mg | ORAL_TABLET | ORAL | 6 refills | Status: DC
Start: 2016-07-07 — End: 2017-02-09

## 2016-07-16 MED ORDER — LOSARTAN-HYDROCHLOROTHIAZIDE 100 MG-12.5 MG TAB
ORAL_TABLET | ORAL | 0 refills | Status: DC
Start: 2016-07-16 — End: 2016-10-16

## 2016-08-01 MED ORDER — CARVEDILOL 6.25 MG TAB
6.25 mg | ORAL_TABLET | ORAL | 0 refills | Status: DC
Start: 2016-08-01 — End: 2016-10-28

## 2016-09-11 ENCOUNTER — Encounter: Admit: 2016-09-11 | Discharge: 2016-09-11 | Payer: PRIVATE HEALTH INSURANCE | Primary: Internal Medicine

## 2016-09-11 ENCOUNTER — Inpatient Hospital Stay: Admit: 2016-09-11 | Payer: BLUE CROSS/BLUE SHIELD | Primary: Internal Medicine

## 2016-09-11 DIAGNOSIS — E785 Hyperlipidemia, unspecified: Secondary | ICD-10-CM

## 2016-09-11 DIAGNOSIS — E7800 Pure hypercholesterolemia, unspecified: Secondary | ICD-10-CM

## 2016-09-12 LAB — METABOLIC PANEL, COMPREHENSIVE
A-G Ratio: 1.1 (ref 0.8–1.7)
ALT (SGPT): 33 U/L (ref 16–61)
AST (SGOT): 21 U/L (ref 15–37)
Albumin: 3.5 g/dL (ref 3.4–5.0)
Alk. phosphatase: 61 U/L (ref 45–117)
Anion gap: 6 mmol/L (ref 3.0–18)
BUN/Creatinine ratio: 17 (ref 12–20)
BUN: 22 MG/DL — ABNORMAL HIGH (ref 7.0–18)
Bilirubin, total: 0.7 MG/DL (ref 0.2–1.0)
CO2: 29 mmol/L (ref 21–32)
Calcium: 8.4 MG/DL — ABNORMAL LOW (ref 8.5–10.1)
Chloride: 108 mmol/L (ref 100–108)
Creatinine: 1.26 MG/DL (ref 0.6–1.3)
GFR est AA: >60 mL/min/{1.73_m2} (ref >60)
GFR est non-AA: 56 mL/min/{1.73_m2} — ABNORMAL LOW (ref >60)
Globulin: 3.3 g/dL (ref 2.0–4.0)
Glucose: 104 mg/dL — ABNORMAL HIGH (ref 74–99)
Potassium: 3.7 mmol/L (ref 3.5–5.5)
Protein, total: 6.8 g/dL (ref 6.4–8.2)
Sodium: 143 mmol/L (ref 136–145)

## 2016-09-12 LAB — LIPID PANEL
CHOL/HDL Ratio: 2.2 (ref 0–5.0)
Cholesterol, total: 135 MG/DL (ref <200)
HDL Cholesterol: 61 MG/DL — ABNORMAL HIGH (ref 40–60)
LDL, calculated: 62.4 MG/DL (ref 0–100)
Triglyceride: 58 MG/DL (ref <150)
VLDL, calculated: 11.6 MG/DL

## 2016-09-12 LAB — PSA SCREENING (SCREENING): Prostate Specific Ag: 3 ng/mL (ref 0.0–4.0)

## 2016-09-18 ENCOUNTER — Encounter

## 2016-09-18 ENCOUNTER — Encounter: Primary: Internal Medicine

## 2016-09-25 ENCOUNTER — Ambulatory Visit
Admit: 2016-09-25 | Discharge: 2016-09-25 | Payer: PRIVATE HEALTH INSURANCE | Attending: Cardiovascular Disease | Primary: Internal Medicine

## 2016-09-25 ENCOUNTER — Ambulatory Visit
Admit: 2016-09-25 | Discharge: 2016-09-25 | Payer: PRIVATE HEALTH INSURANCE | Attending: Internal Medicine | Primary: Internal Medicine

## 2016-09-25 DIAGNOSIS — I2510 Atherosclerotic heart disease of native coronary artery without angina pectoris: Secondary | ICD-10-CM

## 2016-09-25 MED ORDER — SPIRONOLACTONE 25 MG TAB
25 mg | ORAL_TABLET | Freq: Every day | ORAL | 6 refills | Status: DC
Start: 2016-09-25 — End: 2017-04-14

## 2016-09-25 NOTE — Progress Notes (Signed)
1. Have you been to the ER, urgent care clinic since your last visit?  Hospitalized since your last visit?No    2. Have you seen or consulted any other health care providers outside of the [facility] since your last visit?  Include any pap smears or colon screening. No

## 2016-09-25 NOTE — Progress Notes (Signed)
HISTORY OF PRESENT ILLNESS  Raymond [name] is a 71 y.o. male.    HPI  Raymond [name] has been doing very well. Other than leg edema, there are no complaints. His internist stopped Amlodipine this morning because of leg edema. He denies chest pain, dyspnea, orthopnea, PND. He denies palpitation, dizziness or syncope. He denies any symptoms of TIA or amaurosis fugax. Because of bradycardia he had a Holter monitor on 04/05/16 which demonstrated baseline sinus rhythm. Minimum heart rate was 41 beats per minute and average heart rate was 52 beats per minute with maximum heart rate of 94 beats per minute. There was no significant pauses. There was no significant atrial or ventricular arrhythmia. His cholesterol profile has been satisfactory.    He has a history of known coronary artery disease and had an acute inferior wall myocardial infarction on October 12, 2001, and had stenting of the right coronary artery at that time. In January 2006, he started experiencing frequent palpitations and dizziness with a feeling as if he was dropping down on a roller coaster. He was admitted to [facility] on February 27, 2004 with an episode of the sinking feeling and nonsustained ventricular tachycardia. He subsequently underwent cardiac catheterization followed by CABG X3 on March 03, 2004, which consisted of:   1. LIMA to the LAD.   2. Sequential SVG to the diagonal and obtuse marginal branch.   His preoperative EF was in the 45% range. He was treated with amiodarone for nonsustained ventricular tachycardia and has had no recurrence of the sinking feeling or palpitations since the bypass surgery. He developed a large right pleural effusion which was once drained, but he has had some reaccumulation. Because of the sinking feeling, he had a 24-hour Holter monitor which failed to reveal a significant cardiac arrhythmia other than PVCs. He did have the sinking feeling while he was wearing the monitor,  but it did not coincide with any significant ventricular arrhythmia.   He had the follow up stress nuclear cardiac imaging on 02/17/2010 which demonstrated fixed defect involving the inferior base with no significant ischemia. Ejection fraction was estimated at 55%.  He underwent the follow up stress nuclear cardiac imaging on 04/11/13 which demonstrated a small to moderate sized fixed defect in the basal inferior and anterolateral wall with very mild reversibility in the inferolateral wall. Because of a gating error the ejection fraction was not calculated.  He reported that the Coreg has been reduced to 6.25 mg twice a day by [employer]. Raymond Poling, MD because of the bradycardia.  He underwent a noninvasive carotid study in February of 2017, which demonstrated less than 50% stenosis in the right internal carotid artery and no significant stenosis of the left internal carotid artery.   He underwent stress nuclear cardiac imaging on 03/13/2016, which demonstrated a fixed defect in the mid and basal inferior wall with no ischemia.  The ejection fraction was estimated at around 52%.      Review of Systems   Constitutional: Negative for malaise/fatigue and weight loss.   HENT: Negative for hearing loss.    Eyes: Negative for blurred vision and double vision.   Respiratory: Negative for shortness of breath.    Cardiovascular: Positive for leg swelling. Negative for chest pain, palpitations, claudication and PND.   Gastrointestinal: Negative for blood in stool, heartburn and melena.   Genitourinary: Positive for frequency. Negative for dysuria, hematuria and urgency.   Musculoskeletal: Negative for back pain and joint pain.   Skin: Negative  for itching and rash.   Neurological: Negative for dizziness, loss of consciousness and weakness.   Psychiatric/Behavioral: Negative for depression and memory loss.       Physical Exam   Constitutional: He is oriented to person, place, and time. He appears  well-developed and well-nourished.   HENT:   Head: Normocephalic and atraumatic.   Eyes: Conjunctivae are normal. Pupils are equal, round, and reactive to light.   Neck: Normal range of motion. Neck supple. No JVD present.   Cardiovascular: Regular rhythm, S1 normal and S2 normal.   No extrasystoles are present. Bradycardia present.  PMI is not displaced.  Exam reveals no gallop and no friction rub.    No murmur heard.  Pulses:       Carotid pulses are 3+ on the right side, and 3+ on the left side.  Pulmonary/Chest: Effort normal. He has no rales.   Abdominal: Soft. There is no tenderness.   Musculoskeletal: He exhibits edema.   Neurological: He is alert and oriented to person, place, and time. No cranial nerve deficit.   Skin: Skin is warm and dry.   Psychiatric: He has a normal mood and affect. His behavior is normal.     Visit Vitals   ??? BP 160/84   ??? Pulse (!) 51   ??? Ht 5\' 9"  (1.753 m)   ??? Wt 88.5 kg (195 lb)   ??? SpO2 98%   ??? BMI 28.8 kg/m2       Past Medical History:   Diagnosis Date   ??? ASHD (arteriosclerotic heart disease)    ??? CABG x 3 02/2004   ??? Cardiac cath 02/29/2004    oRCA 100%.  LM patent.  p/mLAD 85%.  D1 90%.  D2 90%.  CX 45%.  LVEDP 12 mmHg.  EF 45%.  CABG recommended.   ??? Cardiac echocardiogram 11/08/2005    LVE.  EF 45-50%.  Inferior, inferolateral WMA.     ??? Cardiac nuclear imaging test, mod risk 04/11/2013    Intermediate risk.  Mild-mod basal inferior infarction w/mild-mod peri-infarct ischemia in mid inferior, prox & mid inferolateral walls.  EF unreadable due to gating error.  Neg EKG on max EST.  Ex time 6 min.     ??? Cardiovascular RLE venous duplex 08/05/2013    Right leg:  No DVT.   ??? Coronary artery disease     inferior wall myocardial infarction and stenting of the RCA 9/03; Status post CABG X3 in January 2006.   ??? Elevated prostate specific antigen (PSA)    ??? Hypercholesteremia     low HDL   ??? Hypertension    ??? Hypertension     ??? Hypertrophy of prostate with urinary obstruction and other lower urinary tract symptoms (LUTS)    ??? Microscopic hematuria    ??? 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 Pleural effusion on right    ??? Right renal cyst    ??? Ventricular tachycardia, nonsustained (HCC)        Social History     Social History   ??? Marital status: MARRIED     Spouse name: N/A   ??? Number of children: N/A   ??? Years of education: N/A     Occupational History   ??? Not on file.     Social History Main Topics   ??? Smoking status: Never Smoker   ??? Smokeless tobacco: Never Used   ??? Alcohol use Yes  Comment: social   ??? Drug use: No   ??? Sexual activity: Not on file     Other Topics Concern   ??? Not on file     Social History Narrative       Family History   Problem Relation Age of Onset   ??? Heart Disease Father      x3 heart bypass   ??? Arthritis-osteo Mother    ??? Hypertension Sister        Past Surgical History:   Procedure Laterality Date   ??? HX CORONARY ARTERY BYPASS GRAFT  03/03/04    LIMA to the LAD. Sequential SVG to the diagonal and obtuse marginal branch   ??? HX CORONARY STENT PLACEMENT  10/12/01    RCA stented using 3.5 x 13 mm Express 2 stent   ??? HX HEART CATHETERIZATION  03/03/04    followed by CABG X3   ??? HX TONSILLECTOMY     ??? STENT INSERTION  10/12/01    right coronary artery   ??? VASCULAR SURGERY PROCEDURE UNLIST      biopsy of mass in abdomen, benign       Current Outpatient Prescriptions   Medication Sig Dispense Refill   ??? carvedilol (COREG) 6.25 mg tablet TAKE 1 TABLET BY MOUTH TWICE DAILY WITH MEALS 180 Tab 0   ??? losartan-hydroCHLOROthiazide (HYZAAR) 100-12.5 mg per tablet TAKE 1 TABLET BY MOUTH DAILY 90 Tab 0   ??? simvastatin (ZOCOR) 40 mg tablet TAKE 1 TABLET BY MOUTH EVERY NIGHT 30 Tab 6   ??? omega-3 acid ethyl esters (LOVAZA) 1 gram capsule Take 1 Cap by mouth daily. 1 Cap 0   ??? potassium chloride (KLOR-CON) 10 mEq tablet Take 1 Tab by mouth daily. 90 Tab 3    ??? triamcinolone (NASACORT AQ) 55 mcg nasal inhaler INSTILL 2 SPRAYS INTO EACH NOSTRIL DAILY 17 g 8   ??? aspirin 81 mg tablet Take 81 mg by mouth daily.     ??? MULTIVITAMIN PO Take 1 Tab by mouth daily.         EKG: unchanged from previous tracings, sinus bradycardia, RBBB, questionable inf.scar  .  ASSESSMENT and PLAN  Encounter Diagnoses   Name Primary?   ??? Atherosclerosis of native coronary artery of native heart without angina pectoris,status post CABG X3 in January 2006/EF 45%. Yes   ??? Bradycardia    ??? Hyperlipidemia LDL goal <100    ??? Nonsustained ventricular tachycardia (HCC)    ??? Essential hypertension     He has been doing very well. He has had no symptoms to indicate angina or cardiac decompensation. He continues to have bradycardia but not severe enough to consider pacemaker implantation based on Holter monitor. Because of his leg edema, Amlodipine has been discontinued. At this point, we will discontinue potassium and try him on Aldactone for better control of his hypertension.

## 2016-09-25 NOTE — Progress Notes (Signed)
Raymond [name], Raymond [name] 14-Jun-1945, is a 71 y.o. male, who is seen today for reevaluation of atherosclerotic heart disease hyperlipidemia impaired fasting glucose hypertension now with edema.  For 6 or 8 months he has noticed edema in his ankles and feet that gets worse by the end of the day.  No discomfort.  No dyspnea.  No chest pain.  He takes all of his medicine correctly.  His only other complaint is a 2 or 3 year history of gradually worsening discomfort at the base of his right thumb.    Past Medical History:   Diagnosis Date   ??? ASHD (arteriosclerotic heart disease)    ??? CABG x 3 02/2004   ??? Cardiac cath 02/29/2004    oRCA 100%.  LM patent.  p/mLAD 85%.  D1 90%.  D2 90%.  CX 45%.  LVEDP 12 mmHg.  EF 45%.  CABG recommended.   ??? Cardiac echocardiogram 11/08/2005    LVE.  EF 45-50%.  Inferior, inferolateral WMA.     ??? Cardiac nuclear imaging test, mod risk 04/11/2013    Intermediate risk.  Mild-mod basal inferior infarction w/mild-mod peri-infarct ischemia in mid inferior, prox & mid inferolateral walls.  EF unreadable due to gating error.  Neg EKG on max EST.  Ex time 6 min.     ??? Cardiovascular RLE venous duplex 08/05/2013    Right leg:  No DVT.   ??? Coronary artery disease     inferior wall myocardial infarction and stenting of the RCA 9/03; Status post CABG X3 in January 2006.   ??? Elevated prostate specific antigen (PSA)    ??? Hypercholesteremia     low HDL   ??? Hypertension    ??? Hypertension    ??? Hypertrophy of prostate with urinary obstruction and other lower urinary tract symptoms (LUTS)    ??? Microscopic hematuria    ??? Myocardial infarction, inferior wall ([place]) 10/12/01    acute   ??? Pleural effusion on right    ??? Right renal cyst    ??? Ventricular tachycardia, nonsustained (HCC)      Current Outpatient Prescriptions   Medication Sig Dispense Refill   ??? carvedilol (COREG) 6.25 mg tablet TAKE 1 TABLET BY MOUTH TWICE DAILY WITH MEALS 180 Tab 0   ??? losartan-hydroCHLOROthiazide (HYZAAR) 100-12.5 mg per tablet TAKE 1  TABLET BY MOUTH DAILY 90 Tab 0   ??? simvastatin (ZOCOR) 40 mg tablet TAKE 1 TABLET BY MOUTH EVERY NIGHT 30 Tab 6   ??? omega-3 acid ethyl esters (LOVAZA) 1 gram capsule Take 1 Cap by mouth daily. 1 Cap 0   ??? amLODIPine (NORVASC) 5 mg tablet Take 1 Tab by mouth daily. 90 Tab 3   ??? potassium chloride (KLOR-CON) 10 mEq tablet Take 1 Tab by mouth daily. 90 Tab 3   ??? triamcinolone (NASACORT AQ) 55 mcg nasal inhaler INSTILL 2 SPRAYS INTO EACH NOSTRIL DAILY 17 g 8   ??? aspirin 81 mg tablet Take 81 mg by mouth daily.     ??? MULTIVITAMIN PO Take 1 Tab by mouth daily.       Visit Vitals   ??? BP 128/64   ??? Pulse (!) 56   ??? Temp 98.4 ??F (36.9 ??C) (Oral)   ??? Resp 12   ??? Ht '5\' 9"'$  (1.753 m)   ??? Wt 195 lb (88.5 kg)   ??? SpO2 98%   ??? BMI 28.8 kg/m2     Carotids are 2+ without bruits.  Lungs are clear to percussion.  Good breath sounds with no wheezing or crackles.  Heart reveals a regular rhythm with normal S1-S2 no murmur gallop click or rub.  Apical impulse is not palpable.  Abdomen is soft and nontender with no hepatosplenomegaly or masses and no bruits.  Extremities reveal no clubbing or cyanosis.  He has 1+ lower leg edema and it is only 8:30 in the morning.  Pulses are 2+.  Wynn Maudlin test is negative.    Results for orders placed or performed during the hospital encounter of 09/11/16   LIPID PANEL   Result Value Ref Range    LIPID PROFILE          Cholesterol, total 135 <200 MG/DL    Triglyceride 58 <150 MG/DL    HDL Cholesterol 61 (H) 40 - 60 MG/DL    LDL, calculated 62.4 0 - 100 MG/DL    VLDL, calculated 11.6 MG/DL    CHOL/HDL Ratio 2.2 0 - 5.0     METABOLIC PANEL, COMPREHENSIVE   Result Value Ref Range    Sodium 143 136 - 145 mmol/L    Potassium 3.7 3.5 - 5.5 mmol/L    Chloride 108 100 - 108 mmol/L    CO2 29 21 - 32 mmol/L    Anion gap 6 3.0 - 18 mmol/L    Glucose 104 (H) 74 - 99 mg/dL    BUN 22 (H) 7.0 - 18 MG/DL    Creatinine 1.26 0.6 - 1.3 MG/DL    BUN/Creatinine ratio 17 12 - 20      GFR est AA >60 >60 ml/min/1.50m     GFR est non-AA 56 (L) >60 ml/min/1.758m   Calcium 8.4 (L) 8.5 - 10.1 MG/DL    Bilirubin, total 0.7 0.2 - 1.0 MG/DL    ALT (SGPT) 33 16 - 61 U/L    AST (SGOT) 21 15 - 37 U/L    Alk. phosphatase 61 45 - 117 U/L    Protein, total 6.8 6.4 - 8.2 g/dL    Albumin 3.5 3.4 - 5.0 g/dL    Globulin 3.3 2.0 - 4.0 g/dL    A-G Ratio 1.1 0.8 - 1.7     PSA SCREENING (SCREENING)   Result Value Ref Range    Prostate Specific Ag 3.0 0.0 - 4.0 ng/mL     Assessment: #1.  Hypertension very well controlled but now having edema, we will stop amlodipine at this point and recheck his blood pressure in about a month.  #2.  Hyperlipidemia doing well though not quite as well as previously.  He will continue Levoxyl 1 g daily and simvastatin 40 mg daily.  #3.  ASHD asymptomatic.  He has a follow-up appointment with his cardiologist this afternoon.  #4.  Elevated PSA in the past, about the same as it was a year ago.  That will be monitored on a yearly basis.  #5.  Overweight, is going to work on weight loss, his weight is about the same as it was 6 months ago.  #6.  DJD carpometacarpal joint, it is not bad enough for him to see an orthopedist he says.  I told him I could send him to our new hand specialist if he wants to do that in the future but it is not tendinitis so probably is not really something that can easily be fixed.    Follow-up 1 month    Ailah Barna R. MaCelesta AverMD FACP    Please note:  This document has been produced using voice recognition software.  Unrecognized errors in transcription may be present.

## 2016-10-10 ENCOUNTER — Inpatient Hospital Stay: Admit: 2016-10-10 | Payer: BLUE CROSS/BLUE SHIELD | Primary: Internal Medicine

## 2016-10-10 DIAGNOSIS — I10 Essential (primary) hypertension: Secondary | ICD-10-CM

## 2016-10-10 LAB — METABOLIC PANEL, BASIC
Anion gap: 5 mmol/L (ref 3.0–18)
BUN/Creatinine ratio: 14 (ref 12–20)
BUN: 19 MG/DL — ABNORMAL HIGH (ref 7.0–18)
CO2: 32 mmol/L (ref 21–32)
Calcium: 8.6 MG/DL (ref 8.5–10.1)
Chloride: 106 mmol/L (ref 100–108)
Creatinine: 1.35 MG/DL — ABNORMAL HIGH (ref 0.6–1.3)
GFR est AA: >60 mL/min/{1.73_m2} (ref >60)
GFR est non-AA: 52 mL/min/{1.73_m2} — ABNORMAL LOW (ref >60)
Glucose: 101 mg/dL — ABNORMAL HIGH (ref 74–99)
Potassium: 4.2 mmol/L (ref 3.5–5.5)
Sodium: 143 mmol/L (ref 136–145)

## 2016-10-10 NOTE — Progress Notes (Signed)
ok 

## 2016-10-13 ENCOUNTER — Institutional Professional Consult (permissible substitution): Admit: 2016-10-13 | Discharge: 2016-10-15 | Payer: PRIVATE HEALTH INSURANCE | Primary: Internal Medicine

## 2016-10-13 DIAGNOSIS — M7989 Other specified soft tissue disorders: Secondary | ICD-10-CM

## 2016-10-13 NOTE — Patient Instructions (Signed)
Continue to take all medications as prescribed  Keep all future follows as scheduled

## 2016-10-13 NOTE — Progress Notes (Signed)
Dorena CookeyDavid F [name] is a 71 y.o. male that is here for a blood pressure check. His current medications are listed below.     Current Outpatient Prescriptions   Medication Sig   ??? spironolactone (ALDACTONE) 25 mg tablet Take 1 Tab by mouth daily.   ??? carvedilol (COREG) 6.25 mg tablet TAKE 1 TABLET BY MOUTH TWICE DAILY WITH MEALS   ??? losartan-hydroCHLOROthiazide (HYZAAR) 100-12.5 mg per tablet TAKE 1 TABLET BY MOUTH DAILY   ??? simvastatin (ZOCOR) 40 mg tablet TAKE 1 TABLET BY MOUTH EVERY NIGHT   ??? omega-3 acid ethyl esters (LOVAZA) 1 gram capsule Take 1 Cap by mouth daily.   ??? triamcinolone (NASACORT AQ) 55 mcg nasal inhaler INSTILL 2 SPRAYS INTO EACH NOSTRIL DAILY   ??? aspirin 81 mg tablet Take 81 mg by mouth daily.   ??? MULTIVITAMIN PO Take 1 Tab by mouth daily.     No current facility-administered medications for this visit.                His   Visit Vitals   ??? BP 134/80   ??? Pulse (!) 59   ??? Ht 5\' 9"  (1.753 m)   ??? Wt 87.5 kg (193 lb)   ??? SpO2 98%   ??? BMI 28.5 kg/m2     Patient was seen in office today for a bp check after d/c amlodipine due to swelling and d/c K+ and starting Aldactone 25 mg at last office visit on 09/25/2016. Patient states he takes all medications as prescribed and patient denies having any cardiac symptoms such as chest pain, palpitations, dizziness, SOB. Patient states swelling has improved. Assessed lower extremities for edema, no swelling noted at this time. Patient is down 2 lbs since last visit. Blood pressure/ heart rate improved from today from previous 160/54 HR 51. Patient had blood work done on 10/10/2016 Results to be reviewed by Dr. Junius Finnerhough. Informed patient to continue taking medications as prescribed, and check all future follow ups.    Forwarding to Dr. Junius Finnerhough for review.

## 2016-10-16 MED ORDER — LOSARTAN-HYDROCHLOROTHIAZIDE 100 MG-12.5 MG TAB
ORAL_TABLET | ORAL | 0 refills | Status: DC
Start: 2016-10-16 — End: 2017-01-13

## 2016-10-25 ENCOUNTER — Ambulatory Visit
Admit: 2016-10-25 | Discharge: 2016-10-25 | Payer: PRIVATE HEALTH INSURANCE | Attending: Internal Medicine | Primary: Internal Medicine

## 2016-10-25 DIAGNOSIS — I10 Essential (primary) hypertension: Secondary | ICD-10-CM

## 2016-10-25 NOTE — Progress Notes (Signed)
Raymond [name], Raymond [name] 01/31/1946, is a 71 y.o. male, who is seen today for reevaluation of recent edema, hypertension, atherosclerotic heart disease overweight impaired fasting glucose.  Soon after stopping amlodipine a month ago the edema cleared.  He saw his cardiologist more recently and spironolactone was added.  He takes all his medicine correctly.  No chest pain or dyspnea.  He notes that he has a little lightheaded with her sitting or standing with head movements today.    Past Medical History:   Diagnosis Date   ??? ASHD (arteriosclerotic heart disease)    ??? CABG x 3 02/2004   ??? Cardiac cath 02/29/2004    oRCA 100%.  LM patent.  p/mLAD 85%.  D1 90%.  D2 90%.  CX 45%.  LVEDP 12 mmHg.  EF 45%.  CABG recommended.   ??? Cardiac echocardiogram 11/08/2005    LVE.  EF 45-50%.  Inferior, inferolateral WMA.     ??? Cardiac nuclear imaging test, mod risk 04/11/2013    Intermediate risk.  Mild-mod basal inferior infarction w/mild-mod peri-infarct ischemia in mid inferior, prox & mid inferolateral walls.  EF unreadable due to gating error.  Neg EKG on max EST.  Ex time 6 min.     ??? Cardiovascular RLE venous duplex 08/05/2013    Right leg:  No DVT.   ??? Coronary artery disease     inferior wall myocardial infarction and stenting of the RCA 9/03; Status post CABG X3 in January 2006.   ??? Elevated prostate specific antigen (PSA)    ??? Hypercholesteremia     low HDL   ??? Hypertension    ??? Hypertension    ??? Hypertrophy of prostate with urinary obstruction and other lower urinary tract symptoms (LUTS)    ??? Microscopic hematuria    ??? 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 Pleural effusion on right    ??? Right renal cyst    ??? Ventricular tachycardia, nonsustained (HCC)      Current Outpatient Prescriptions   Medication Sig Dispense Refill   ??? losartan-hydroCHLOROthiazide (HYZAAR) 100-12.5 mg per tablet TAKE 1 TABLET BY MOUTH DAILY 90 Tab 0   ??? spironolactone (ALDACTONE) 25 mg tablet Take 1 Tab by mouth daily. 30  Tab 6   ??? carvedilol (COREG) 6.25 mg tablet TAKE 1 TABLET BY MOUTH TWICE DAILY WITH MEALS 180 Tab 0   ??? simvastatin (ZOCOR) 40 mg tablet TAKE 1 TABLET BY MOUTH EVERY NIGHT 30 Tab 6   ??? omega-3 acid ethyl esters (LOVAZA) 1 gram capsule Take 1 Cap by mouth daily. 1 Cap 0   ??? triamcinolone (NASACORT AQ) 55 mcg nasal inhaler INSTILL 2 SPRAYS INTO EACH NOSTRIL DAILY 17 g 8   ??? aspirin 81 mg tablet Take 81 mg by mouth daily.     ??? MULTIVITAMIN PO Take 1 Tab by mouth daily.       Visit Vitals   ??? BP 132/70   ??? Pulse (!) 52   ??? Temp 97.7 ??F (36.5 ??C) (Oral)   ??? Resp 12   ??? Ht 5\' 9"  (1.753 m)   ??? Wt 191 lb (86.6 kg)   ??? SpO2 98%   ??? BMI 28.21 kg/m2     Carotids are 2+ without bruits.  No JVD or HJR.  Lungs are clear to percussion.  Good breath sounds with no wheezing or crackles.  Heart reveals a regular rhythm with normal S1-S2 no murmur gallop click or rub.  Apical  impulse is not palpable.  Abdomen is soft and nontender with no hepatosplenomegaly or masses and no bruits.  Extremities reveal no clubbing cyanosis or edema.  Pulses are 2+.  No nystagmus, no symptoms with Dix-Hallpike maneuver.    Assessment: #1.  Hypertension well controlled on current medication.  He will continue current medication, but I did warn him that if he develops significant tenderness of his breasts he may have to stop spironolactone, there is no firm need for this drug at this time.  If he does so he will not restart potassium unless we measure potassium and find it to be high.  #2.  ASHD asymptomatic.  Will continue current medication.  #3.  Overweight down 4 pounds of fluid in the last month, he will continue very healthy diet and try and lose a few more pounds over the next 5 months.  #4.  Impaired fasting glucose, he will work on weight loss.    He will get a flu shot now and follow-up in about 5 months for complete evaluation or sooner if needed.    Keonna Raether [name], MD FACP     Please note:  This document has been produced using voice recognition software. Unrecognized errors in transcription may be present.

## 2016-10-30 MED ORDER — CARVEDILOL 6.25 MG TAB
6.25 mg | ORAL_TABLET | ORAL | 0 refills | Status: DC
Start: 2016-10-30 — End: 2017-01-23

## 2017-01-15 MED ORDER — LOSARTAN-HYDROCHLOROTHIAZIDE 100 MG-12.5 MG TAB
ORAL_TABLET | ORAL | 0 refills | Status: DC
Start: 2017-01-15 — End: 2017-04-14

## 2017-01-23 MED ORDER — CARVEDILOL 6.25 MG TAB
6.25 mg | ORAL_TABLET | Freq: Two times a day (BID) | ORAL | 0 refills | Status: DC
Start: 2017-01-23 — End: 2017-04-14

## 2017-01-23 NOTE — Telephone Encounter (Signed)
Last OV: 10/25/2016

## 2017-02-09 MED ORDER — SIMVASTATIN 40 MG TAB
40 mg | ORAL_TABLET | ORAL | 6 refills | Status: DC
Start: 2017-02-09 — End: 2017-09-05

## 2017-02-09 NOTE — Telephone Encounter (Signed)
Sending to nurses, no response from Janay

## 2017-02-09 NOTE — Telephone Encounter (Signed)
Please send to pharmacy on file

## 2017-03-15 ENCOUNTER — Encounter: Admit: 2017-03-15 | Discharge: 2017-03-15 | Payer: PRIVATE HEALTH INSURANCE | Primary: Internal Medicine

## 2017-03-15 ENCOUNTER — Inpatient Hospital Stay: Admit: 2017-03-15 | Payer: MEDICARE | Primary: Internal Medicine

## 2017-03-15 DIAGNOSIS — I10 Essential (primary) hypertension: Secondary | ICD-10-CM

## 2017-03-16 LAB — METABOLIC PANEL, COMPREHENSIVE
A-G Ratio: 1.2 (ref 0.8–1.7)
ALT (SGPT): 33 U/L (ref 16–61)
AST (SGOT): 14 U/L — ABNORMAL LOW (ref 15–37)
Albumin: 3.7 g/dL (ref 3.4–5.0)
Alk. phosphatase: 57 U/L (ref 45–117)
Anion gap: 6 mmol/L (ref 3.0–18)
BUN/Creatinine ratio: 13 (ref 12–20)
BUN: 18 MG/DL (ref 7.0–18)
Bilirubin, total: 0.6 MG/DL (ref 0.2–1.0)
CO2: 30 mmol/L (ref 21–32)
Calcium: 9.1 MG/DL (ref 8.5–10.1)
Chloride: 106 mmol/L (ref 100–108)
Creatinine: 1.44 MG/DL — ABNORMAL HIGH (ref 0.6–1.3)
GFR est AA: 58 mL/min/{1.73_m2} — ABNORMAL LOW (ref >60)
GFR est non-AA: 48 mL/min/{1.73_m2} — ABNORMAL LOW (ref >60)
Globulin: 3 g/dL (ref 2.0–4.0)
Glucose: 109 mg/dL — ABNORMAL HIGH (ref 74–99)
Potassium: 4.1 mmol/L (ref 3.5–5.5)
Protein, total: 6.7 g/dL (ref 6.4–8.2)
Sodium: 142 mmol/L (ref 136–145)

## 2017-03-16 LAB — CBC WITH AUTOMATED DIFF
ABS. BASOPHILS: 0 10*3/uL (ref 0.0–0.1)
ABS. EOSINOPHILS: 0.2 10*3/uL (ref 0.0–0.4)
ABS. LYMPHOCYTES: 1.5 10*3/uL (ref 0.9–3.6)
ABS. MONOCYTES: 0.6 10*3/uL (ref 0.05–1.2)
ABS. NEUTROPHILS: 3.7 10*3/uL (ref 1.8–8.0)
BASOPHILS: 1 % (ref 0–2)
EOSINOPHILS: 4 % (ref 0–5)
HCT: 42.5 % (ref 36.0–48.0)
HGB: 13.6 g/dL (ref 13.0–16.0)
LYMPHOCYTES: 24 % (ref 21–52)
MCH: 30.8 PG (ref 24.0–34.0)
MCHC: 32 g/dL (ref 31.0–37.0)
MCV: 96.4 FL (ref 74.0–97.0)
MONOCYTES: 11 % — ABNORMAL HIGH (ref 3–10)
MPV: 10.5 FL (ref 9.2–11.8)
NEUTROPHILS: 60 % (ref 40–73)
PLATELET: 180 10*3/uL (ref 135–420)
RBC: 4.41 M/uL — ABNORMAL LOW (ref 4.70–5.50)
RDW: 13.5 % (ref 11.6–14.5)
WBC: 6 10*3/uL (ref 4.6–13.2)

## 2017-03-16 LAB — LIPID PANEL
CHOL/HDL Ratio: 2.3 (ref 0–5.0)
Cholesterol, total: 123 MG/DL (ref <200)
HDL Cholesterol: 53 MG/DL (ref 40–60)
LDL, calculated: 54.6 MG/DL (ref 0–100)
Triglyceride: 77 MG/DL (ref <150)
VLDL, calculated: 15.4 MG/DL

## 2017-03-19 ENCOUNTER — Ambulatory Visit
Admit: 2017-03-19 | Discharge: 2017-03-19 | Payer: PRIVATE HEALTH INSURANCE | Attending: Cardiovascular Disease | Primary: Internal Medicine

## 2017-03-19 DIAGNOSIS — I2510 Atherosclerotic heart disease of native coronary artery without angina pectoris: Secondary | ICD-10-CM

## 2017-03-19 NOTE — Progress Notes (Signed)
HISTORY OF PRESENT ILLNESS  Raymond [name] is a 72 y.o. male.    HPI  He has been feeling well. He denies any cardiac symptoms. He denies chest pain, dyspnea, orthopnea, PND. He denies palpitation, dizziness or syncope. He denies any symptoms of TIA or amaurosis fugax.     He has a history of known coronary artery disease and had an acute inferior wall myocardial infarction on October 12, 2001, and had stenting of the right coronary artery at that time. In January 2006, he started experiencing frequent palpitations and dizziness with a feeling as if he was dropping down on a roller coaster. He was admitted to [facility] on February 27, 2004 with an episode of the sinking feeling and nonsustained ventricular tachycardia. He subsequently underwent cardiac catheterization followed by CABG X3 on March 03, 2004, which consisted of:   1. LIMA to the LAD.   2. Sequential SVG to the diagonal and obtuse marginal branch.   His preoperative EF was in the 45% range. He was treated with amiodarone for nonsustained ventricular tachycardia and has had no recurrence of the sinking feeling or palpitations since the bypass surgery. He developed a large right pleural effusion which was once drained, but he has had some reaccumulation. Because of the sinking feeling, he had a 24-hour Holter monitor which failed to reveal a significant cardiac arrhythmia other than PVCs. He did have the sinking feeling while he was wearing the monitor, but it did not coincide with any significant ventricular arrhythmia.   He had the follow up stress nuclear cardiac imaging on 02/17/2010 which demonstrated fixed defect involving the inferior base with no significant ischemia. Ejection fraction was estimated at 55%.  He underwent the follow up stress nuclear cardiac imaging on 04/11/13 which demonstrated a small to moderate sized fixed defect in the basal inferior and anterolateral wall with very mild reversibility in the inferolateral  wall. Because of a gating error the ejection fraction was not calculated.  He reported that the Coreg has been reduced to 6.25 mg twice a day by [employer]. Jill Poling, MD because of the bradycardia.  He underwent a noninvasive carotid study in February of 2017, which demonstrated less than 50% stenosis in the right internal carotid artery and no significant stenosis of the left internal carotid artery.   He underwent stress nuclear cardiac imaging on 03/13/2016, which demonstrated a fixed defect in the mid and basal inferior wall with no ischemia. ??The ejection fraction was estimated at around 52%. ??  Because of bradycardia he had a Holter monitor on 04/05/16 which demonstrated baseline sinus rhythm. Minimum heart rate was 41 beats per minute and average heart rate was 52 beats per minute with maximum heart rate of 94 beats per minute. There was no significant pauses. There was no significant atrial or ventricular arrhythmia. His cholesterol profile has been satisfactory.    Review of Systems   Constitutional: Negative for malaise/fatigue and weight loss.   HENT: Negative for hearing loss.    Eyes: Negative for blurred vision and double vision.   Respiratory: Negative for shortness of breath.    Cardiovascular: Negative for chest pain, palpitations, orthopnea, claudication, leg swelling and PND.   Gastrointestinal: Negative for blood in stool, heartburn and melena.   Genitourinary: Positive for frequency. Negative for dysuria, hematuria and urgency.   Musculoskeletal: Negative for back pain and joint pain.   Skin: Negative for itching and rash.   Neurological: Negative for dizziness, loss of consciousness and weakness.  Psychiatric/Behavioral: Negative for depression and memory loss.       Physical Exam   Constitutional: He is oriented to person, place, and time. He appears well-developed and well-nourished.   HENT:   Head: Normocephalic and atraumatic.    Eyes: Conjunctivae are normal. Pupils are equal, round, and reactive to light.   Neck: Normal range of motion. Neck supple. No JVD present.   Cardiovascular: Regular rhythm, S1 normal and S2 normal.  No extrasystoles are present. Bradycardia present. PMI is not displaced. Exam reveals no gallop and no friction rub.   No murmur heard.  Pulses:       Carotid pulses are 3+ on the right side, and 3+ on the left side.  Pulmonary/Chest: Effort normal. He has no rales.   Abdominal: Soft. There is no tenderness.   Musculoskeletal: He exhibits no edema.   Neurological: He is alert and oriented to person, place, and time. No cranial nerve deficit.   Skin: Skin is warm and dry.   Psychiatric: He has a normal mood and affect. His behavior is normal.     Visit Vitals  BP 130/76   Pulse (!) 52   Ht 5\' 9"  (1.753 m)   Wt 88.9 kg (196 lb)   SpO2 98%   BMI 28.94 kg/m??       Past Medical History:   Diagnosis Date   ??? ASHD (arteriosclerotic heart disease)    ??? CABG x 3 02/2004   ??? Cardiac cath 02/29/2004    oRCA 100%.  LM patent.  p/mLAD 85%.  D1 90%.  D2 90%.  CX 45%.  LVEDP 12 mmHg.  EF 45%.  CABG recommended.   ??? Cardiac echocardiogram 11/08/2005    LVE.  EF 45-50%.  Inferior, inferolateral WMA.     ??? Cardiac nuclear imaging test, mod risk 04/11/2013    Intermediate risk.  Mild-mod basal inferior infarction w/mild-mod peri-infarct ischemia in mid inferior, prox & mid inferolateral walls.  EF unreadable due to gating error.  Neg EKG on max EST.  Ex time 6 min.     ??? Cardiovascular RLE venous duplex 08/05/2013    Right leg:  No DVT.   ??? Coronary artery disease     inferior wall myocardial infarction and stenting of the RCA 9/03; Status post CABG X3 in January 2006.   ??? Elevated prostate specific antigen (PSA)    ??? Hypercholesteremia     low HDL   ??? Hypertension    ??? Hypertension    ??? Hypertrophy of prostate with urinary obstruction and other lower urinary tract symptoms (LUTS)    ??? Microscopic hematuria     ??? 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 Pleural effusion on right    ??? Right renal cyst    ??? Ventricular tachycardia, nonsustained (HCC)        Social History     Socioeconomic History   ??? Marital status: MARRIED     Spouse name: Not on file   ??? Number of children: Not on file   ??? Years of education: Not on file   ??? Highest education level: Not on file   Social Needs   ??? Financial resource strain: Not on file   ??? Food insecurity - worry: Not on file   ??? Food insecurity - inability: Not on file   ??? Transportation needs - medical: Not on file   ??? Transportation needs - non-medical: Not on file   Occupational  History   ??? Not on file   Tobacco Use   ??? Smoking status: Never Smoker   ??? Smokeless tobacco: Never Used   Substance and Sexual Activity   ??? Alcohol use: Yes     Comment: social   ??? Drug use: No   ??? Sexual activity: Not on file   Other Topics Concern   ??? Not on file   Social History Narrative   ??? Not on file       Family History   Problem Relation Age of Onset   ??? Heart Disease Father         x3 heart bypass   ??? Arthritis-osteo Mother    ??? Hypertension Sister        Past Surgical History:   Procedure Laterality Date   ??? HX CORONARY ARTERY BYPASS GRAFT  03/03/04    LIMA to the LAD. Sequential SVG to the diagonal and obtuse marginal branch   ??? HX CORONARY STENT PLACEMENT  10/12/01    RCA stented using 3.5 x 13 mm Express 2 stent   ??? HX HEART CATHETERIZATION  03/03/04    followed by CABG X3   ??? HX TONSILLECTOMY     ??? STENT INSERTION  10/12/01    right coronary artery   ??? VASCULAR SURGERY PROCEDURE UNLIST      biopsy of mass in abdomen, benign       Current Outpatient Medications   Medication Sig Dispense Refill   ??? simvastatin (ZOCOR) 40 mg tablet TAKE 1 TABLET BY MOUTH EVERY NIGHT 30 Tab 6   ??? carvedilol (COREG) 6.25 mg tablet Take 1 Tab by mouth two (2) times daily (with meals). 180 Tab 0   ??? losartan-hydroCHLOROthiazide (HYZAAR) 100-12.5 mg per tablet TAKE 1 TABLET BY MOUTH DAILY 90 Tab 0    ??? spironolactone (ALDACTONE) 25 mg tablet Take 1 Tab by mouth daily. 30 Tab 6   ??? omega-3 acid ethyl esters (LOVAZA) 1 gram capsule Take 1 Cap by mouth daily. 1 Cap 0   ??? triamcinolone (NASACORT AQ) 55 mcg nasal inhaler INSTILL 2 SPRAYS INTO EACH NOSTRIL DAILY 17 g 8   ??? aspirin 81 mg tablet Take 81 mg by mouth daily.     ??? MULTIVITAMIN PO Take 1 Tab by mouth daily.         EKG: unchanged from previous tracings, sinus bradycardia, RBBB, inf.scar  .  ASSESSMENT and PLAN  Encounter Diagnoses   Name Primary?   ??? Atherosclerosis of native coronary artery of native heart without angina pectoris Yes    Comment: CABGx3 2006,EF 45%   ??? Bradycardia    ??? Nonsustained ventricular tachycardia (HCC)    ??? Essential hypertension with goal blood pressure less than 140/90    ??? Hypercholesteremia    He has been doing very well. He has had no symptoms to indicate angina or cardiac decompensation. He has had bradycardia but completely asymptomatic. There has been no clear cut indication for pacemaker or discontinue beta-blocker thus far. His blood pressure has been under control. For now, he will be continued on current medical regimen.

## 2017-03-19 NOTE — Progress Notes (Signed)
Raymond Cookeyavid F Pezzullo presents today for   Chief Complaint   Patient presents with   ??? Follow-up     6 month - no cardiac complaints       Raymond [name] preferred language for health care discussion is english/other.    Is someone accompanying this pt? No     Is the patient using any DME equipment during OV? NO     Depression Screening:  PHQ over the last two weeks 10/25/2016   Little interest or pleasure in doing things Not at all   Feeling down, depressed, irritable, or hopeless Not at all   Total Score PHQ 2 0       Learning Assessment:  Learning Assessment 09/11/2013   PRIMARY LEARNER Patient   HIGHEST LEVEL OF EDUCATION - PRIMARY LEARNER  -   BARRIERS PRIMARY LEARNER -   CO-LEARNER CAREGIVER -   PRIMARY LANGUAGE ENGLISH   INTERPRETER NEED -   LEARNER PREFERENCE PRIMARY LISTENING   ANSWERED BY patient   RELATIONSHIP SELF       Abuse Screening:  No flowsheet data found.    Fall Risk  Fall Risk Assessment, last 12 mths 10/25/2016   Able to walk? Yes   Fall in past 12 months? No       Pt currently taking Antiplatelet therapy? ASA     Coordination of Care:  1. Have you been to the ER, urgent care clinic since your last visit? Hospitalized since your last visit? No     2. Have you seen or consulted any other health care providers outside of the [facility] since your last visit? Include any pap smears or colon screening. No

## 2017-03-26 ENCOUNTER — Ambulatory Visit
Admit: 2017-03-26 | Discharge: 2017-03-26 | Payer: PRIVATE HEALTH INSURANCE | Attending: Internal Medicine | Primary: Internal Medicine

## 2017-03-26 DIAGNOSIS — Z0000 Encounter for general adult medical examination without abnormal findings: Secondary | ICD-10-CM

## 2017-03-26 NOTE — Progress Notes (Signed)
Raymond [name], pfeifle 1945-06-01, is a 72 y.o. male, who is seen today for a routine physical exam and follow-up on atherosclerotic heart disease, impaired fasting glucose, renal failure, hyperlipidemia and hypertension.  He is gained 5 pounds over last 4 months but does stick with a very healthy diet.  He feels well and will be needing another stress test for a year apparently.  He is having no exertional chest pain he takes all his medicines correctly.    Past Medical History:   Diagnosis Date   ??? ASHD (arteriosclerotic heart disease)    ??? CABG x 3 02/2004   ??? Cardiac cath 02/29/2004    oRCA 100%.  LM patent.  p/mLAD 85%.  D1 90%.  D2 90%.  CX 45%.  LVEDP 12 mmHg.  EF 45%.  CABG recommended.   ??? Cardiac echocardiogram 11/08/2005    LVE.  EF 45-50%.  Inferior, inferolateral WMA.     ??? Cardiac nuclear imaging test, mod risk 04/11/2013    Intermediate risk.  Mild-mod basal inferior infarction w/mild-mod peri-infarct ischemia in mid inferior, prox & mid inferolateral walls.  EF unreadable due to gating error.  Neg EKG on max EST.  Ex time 6 min.     ??? Cardiovascular RLE venous duplex 08/05/2013    Right leg:  No DVT.   ??? Coronary artery disease     inferior wall myocardial infarction and stenting of the RCA 9/03; Status post CABG X3 in January 2006.   ??? Elevated prostate specific antigen (PSA)    ??? Hypercholesteremia     low HDL   ??? Hypertension    ??? Hypertension    ??? Hypertrophy of prostate with urinary obstruction and other lower urinary tract symptoms (LUTS)    ??? Microscopic hematuria    ??? Myocardial infarction, inferior wall ([place]) 10/12/01    acute   ??? Pleural effusion on right    ??? Right renal cyst    ??? Ventricular tachycardia, nonsustained (HCC)      Past Surgical History:   Procedure Laterality Date   ??? HX CORONARY ARTERY BYPASS GRAFT  03/03/04    LIMA to the LAD. Sequential SVG to the diagonal and obtuse marginal branch   ??? HX CORONARY STENT PLACEMENT  10/12/01    RCA stented using 3.5 x 13 mm Express 2 stent    ??? HX HEART CATHETERIZATION  03/03/04    followed by CABG X3   ??? HX TONSILLECTOMY     ??? STENT INSERTION  10/12/01    right coronary artery   ??? VASCULAR SURGERY PROCEDURE UNLIST      biopsy of mass in abdomen, benign     Current Outpatient Medications   Medication Sig Dispense Refill   ??? simvastatin (ZOCOR) 40 mg tablet TAKE 1 TABLET BY MOUTH EVERY NIGHT 30 Tab 6   ??? carvedilol (COREG) 6.25 mg tablet Take 1 Tab by mouth two (2) times daily (with meals). 180 Tab 0   ??? losartan-hydroCHLOROthiazide (HYZAAR) 100-12.5 mg per tablet TAKE 1 TABLET BY MOUTH DAILY 90 Tab 0   ??? spironolactone (ALDACTONE) 25 mg tablet Take 1 Tab by mouth daily. 30 Tab 6   ??? omega-3 acid ethyl esters (LOVAZA) 1 gram capsule Take 1 Cap by mouth daily. 1 Cap 0   ??? triamcinolone (NASACORT AQ) 55 mcg nasal inhaler INSTILL 2 SPRAYS INTO EACH NOSTRIL DAILY 17 g 8   ??? aspirin 81 mg tablet Take 81 mg by mouth daily.     ???  MULTIVITAMIN PO Take 1 Tab by mouth daily.       Allergies   Allergen Reactions   ??? Ace Inhibitors Unknown (comments) and Cough     Intolerance b/c of dry coughing   ??? Bee Sting [Sting, Bee] Hives     Social History     Socioeconomic History   ??? Marital status: MARRIED     Spouse name: Not on file   ??? Number of children: Not on file   ??? Years of education: Not on file   ??? Highest education level: Not on file   Tobacco Use   ??? Smoking status: Never Smoker   ??? Smokeless tobacco: Never Used   Substance and Sexual Activity   ??? Alcohol use: Yes     Comment: social   ??? Drug use: No     Visit Vitals  BP 118/62 (BP 1 Location: Left arm, BP Patient Position: Sitting)   Pulse (!) 56   Temp 97.9 ??F (36.6 ??C) (Oral)   Resp 16   Ht 5' 9"  (1.753 m)   Wt 196 lb 9.6 oz (89.2 kg)   SpO2 97%   BMI 29.03 kg/m??     .[id number]    Results for orders placed or performed during the hospital encounter of 03/15/17   CBC WITH AUTOMATED DIFF   Result Value Ref Range    WBC 6.0 4.6 - 13.2 K/uL    RBC 4.41 (L) 4.70 - 5.50 M/uL    HGB 13.6 13.0 - 16.0 g/dL     HCT 42.5 36.0 - 48.0 %    MCV 96.4 74.0 - 97.0 FL    MCH 30.8 24.0 - 34.0 PG    MCHC 32.0 31.0 - 37.0 g/dL    RDW 13.5 11.6 - 14.5 %    PLATELET 180 135 - 420 K/uL    MPV 10.5 9.2 - 11.8 FL    NEUTROPHILS 60 40 - 73 %    LYMPHOCYTES 24 21 - 52 %    MONOCYTES 11 (H) 3 - 10 %    EOSINOPHILS 4 0 - 5 %    BASOPHILS 1 0 - 2 %    ABS. NEUTROPHILS 3.7 1.8 - 8.0 K/UL    ABS. LYMPHOCYTES 1.5 0.9 - 3.6 K/UL    ABS. MONOCYTES 0.6 0.05 - 1.2 K/UL    ABS. EOSINOPHILS 0.2 0.0 - 0.4 K/UL    ABS. BASOPHILS 0.0 0.0 - 0.1 K/UL    DF AUTOMATED     LIPID PANEL   Result Value Ref Range    LIPID PROFILE          Cholesterol, total 123 <200 MG/DL    Triglyceride 77 <150 MG/DL    HDL Cholesterol 53 40 - 60 MG/DL    LDL, calculated 54.6 0 - 100 MG/DL    VLDL, calculated 15.4 MG/DL    CHOL/HDL Ratio 2.3 0 - 5.0     METABOLIC PANEL, COMPREHENSIVE   Result Value Ref Range    Sodium 142 136 - 145 mmol/L    Potassium 4.1 3.5 - 5.5 mmol/L    Chloride 106 100 - 108 mmol/L    CO2 30 21 - 32 mmol/L    Anion gap 6 3.0 - 18 mmol/L    Glucose 109 (H) 74 - 99 mg/dL    BUN 18 7.0 - 18 MG/DL    Creatinine 1.44 (H) 0.6 - 1.3 MG/DL    BUN/Creatinine ratio 13 12 - 20  GFR est AA 58 (L) >60 ml/min/1.53m    GFR est non-AA 48 (L) >60 ml/min/1.743m   Calcium 9.1 8.5 - 10.1 MG/DL    Bilirubin, total 0.6 0.2 - 1.0 MG/DL    ALT (SGPT) 33 16 - 61 U/L    AST (SGOT) 14 (L) 15 - 37 U/L    Alk. phosphatase 57 45 - 117 U/L    Protein, total 6.7 6.4 - 8.2 g/dL    Albumin 3.7 3.4 - 5.0 g/dL    Globulin 3.0 2.0 - 4.0 g/dL    A-G Ratio 1.2 0.8 - 1.7       Assessment: #1.  ASHD asymptomatic, he will continue aspirin 81 mg daily, carvedilol 6.25 mg twice a day and spironolactone 25 mg daily.  #2.  Hyperlipidemia doing well, he will continue simvastatin 40 mg each evening.  #3.  Hypertension well controlled, he will continue Hyzaar 1 daily.  #4.  Mildly overweight, he is can work on losing 5 pounds he is  gained over the last few months.  #5.  Impaired fasting glucose, he is going to work on weight loss.  #6.  Chronic renal insufficiency not doing quite as well as it was a year ago.  We will monitor that and control risk factors.    Follow-up in 6 months with lab to include PSA.  I have encouraged him to endoscopy, he says he may get around to: Dr. LaKellie Simmeringees his wife.    Dahiana Kulak R. MaCelesta AverMD FACP    Please note:  This document has been produced using voice recognition software. Unrecognized errors in transcription may be present.

## 2017-03-26 NOTE — Progress Notes (Signed)
1. Have you been to the ER, urgent care clinic or hospitalized since your last visit? NO           2. Have you seen or consulted any other health care providers outside of the [facility] since your last visit (Include any pap smears or colon screening)? NO    Do you have an Advanced Directive? NO    Would you like information on Advanced Directives? NO

## 2017-04-14 ENCOUNTER — Encounter

## 2017-04-15 MED ORDER — CARVEDILOL 6.25 MG TAB
6.25 mg | ORAL_TABLET | ORAL | 0 refills | Status: DC
Start: 2017-04-15 — End: 2017-07-12

## 2017-04-15 MED ORDER — LOSARTAN-HYDROCHLOROTHIAZIDE 100 MG-12.5 MG TAB
ORAL_TABLET | ORAL | 0 refills | Status: DC
Start: 2017-04-15 — End: 2017-07-12

## 2017-04-16 MED ORDER — SPIRONOLACTONE 25 MG TAB
25 mg | ORAL_TABLET | ORAL | 6 refills | Status: DC
Start: 2017-04-16 — End: 2017-09-17

## 2017-07-12 MED ORDER — LOSARTAN-HYDROCHLOROTHIAZIDE 100 MG-12.5 MG TAB
ORAL_TABLET | ORAL | 0 refills | Status: DC
Start: 2017-07-12 — End: 2017-09-17

## 2017-07-12 MED ORDER — CARVEDILOL 6.25 MG TAB
6.25 mg | ORAL_TABLET | ORAL | 0 refills | Status: DC
Start: 2017-07-12 — End: 2017-10-15

## 2017-09-05 MED ORDER — SIMVASTATIN 40 MG TAB
40 mg | ORAL_TABLET | ORAL | 0 refills | Status: DC
Start: 2017-09-05 — End: 2017-10-06

## 2017-09-10 ENCOUNTER — Inpatient Hospital Stay: Admit: 2017-09-10 | Payer: MEDICARE | Primary: Internal Medicine

## 2017-09-10 ENCOUNTER — Encounter: Admit: 2017-09-10 | Discharge: 2017-09-10 | Payer: PRIVATE HEALTH INSURANCE | Primary: Internal Medicine

## 2017-09-10 DIAGNOSIS — E785 Hyperlipidemia, unspecified: Secondary | ICD-10-CM

## 2017-09-10 LAB — PSA SCREENING (SCREENING): Prostate Specific Ag: 2.9 ng/mL (ref 0.0–4.0)

## 2017-09-10 LAB — METABOLIC PANEL, COMPREHENSIVE
A-G Ratio: 1.4 (ref 0.8–1.7)
ALT (SGPT): 25 U/L (ref 16–61)
AST (SGOT): 16 U/L (ref 10–38)
Albumin: 3.7 g/dL (ref 3.4–5.0)
Alk. phosphatase: 57 U/L (ref 45–117)
Anion gap: 6 mmol/L (ref 3.0–18)
BUN/Creatinine ratio: 13 (ref 12–20)
BUN: 21 MG/DL — ABNORMAL HIGH (ref 7.0–18)
Bilirubin, total: 0.7 MG/DL (ref 0.2–1.0)
CO2: 29 mmol/L (ref 21–32)
Calcium: 8.7 MG/DL (ref 8.5–10.1)
Chloride: 107 mmol/L (ref 100–111)
Creatinine: 1.66 MG/DL — ABNORMAL HIGH (ref 0.6–1.3)
GFR est AA: 49 mL/min/{1.73_m2} — ABNORMAL LOW (ref >60)
GFR est non-AA: 41 mL/min/{1.73_m2} — ABNORMAL LOW (ref >60)
Globulin: 2.7 g/dL (ref 2.0–4.0)
Glucose: 102 mg/dL — ABNORMAL HIGH (ref 74–99)
Potassium: 4.3 mmol/L (ref 3.5–5.5)
Protein, total: 6.4 g/dL (ref 6.4–8.2)
Sodium: 142 mmol/L (ref 136–145)

## 2017-09-10 LAB — LIPID PANEL
CHOL/HDL Ratio: 2.6 (ref 0–5.0)
Chol/HDL Ratio: 2.6 (ref 0–5.0)
Cholesterol, Total: 134 MG/DL (ref <200)
Cholesterol, total: 134 MG/DL (ref <200)
HDL Cholesterol: 51 MG/DL (ref 40–60)
HDL: 51 MG/DL (ref 40–60)
LDL Calculated: 66.6 MG/DL (ref 0–100)
LDL, calculated: 66.6 MG/DL (ref 0–100)
Triglyceride: 82 MG/DL (ref <150)
Triglycerides: 82 MG/DL (ref <150)
VLDL Cholesterol Calculated: 16.4 MG/DL
VLDL, calculated: 16.4 MG/DL

## 2017-09-10 LAB — PSA SCREENING: Pros. Spec. Antigen: 2.9 ng/mL (ref 0.0–4.0)

## 2017-09-10 LAB — COMPREHENSIVE METABOLIC PANEL
ALT: 25 U/L (ref 16–61)
AST: 16 U/L (ref 10–38)
Albumin/Globulin Ratio: 1.4 (ref 0.8–1.7)
Albumin: 3.7 g/dL (ref 3.4–5.0)
Alkaline Phosphatase: 57 U/L (ref 45–117)
Anion Gap: 6 mmol/L (ref 3.0–18)
BUN: 21 MG/DL — ABNORMAL HIGH (ref 7.0–18)
Bun/Cre Ratio: 13 (ref 12–20)
CO2: 29 mmol/L (ref 21–32)
Calcium: 8.7 MG/DL (ref 8.5–10.1)
Chloride: 107 mmol/L (ref 100–111)
Creatinine: 1.66 MG/DL — ABNORMAL HIGH (ref 0.6–1.3)
EGFR IF NonAfrican American: 41 mL/min/{1.73_m2} — ABNORMAL LOW (ref >60)
GFR African American: 49 mL/min/{1.73_m2} — ABNORMAL LOW (ref >60)
Globulin: 2.7 g/dL (ref 2.0–4.0)
Glucose: 102 mg/dL — ABNORMAL HIGH (ref 74–99)
Potassium: 4.3 mmol/L (ref 3.5–5.5)
Sodium: 142 mmol/L (ref 136–145)
Total Bilirubin: 0.7 MG/DL (ref 0.2–1.0)
Total Protein: 6.4 g/dL (ref 6.4–8.2)

## 2017-09-17 ENCOUNTER — Ambulatory Visit
Admit: 2017-09-17 | Discharge: 2017-09-17 | Payer: PRIVATE HEALTH INSURANCE | Attending: Cardiovascular Disease | Primary: Internal Medicine

## 2017-09-17 ENCOUNTER — Ambulatory Visit
Admit: 2017-09-17 | Discharge: 2017-09-17 | Payer: PRIVATE HEALTH INSURANCE | Attending: Internal Medicine | Primary: Internal Medicine

## 2017-09-17 ENCOUNTER — Ambulatory Visit: Attending: Internal Medicine | Primary: Internal Medicine

## 2017-09-17 ENCOUNTER — Ambulatory Visit: Attending: Cardiovascular Disease | Primary: Internal Medicine

## 2017-09-17 DIAGNOSIS — I2510 Atherosclerotic heart disease of native coronary artery without angina pectoris: Secondary | ICD-10-CM

## 2017-09-17 DIAGNOSIS — I10 Essential (primary) hypertension: Secondary | ICD-10-CM

## 2017-09-17 MED ORDER — LOSARTAN 50 MG TAB
50 mg | ORAL_TABLET | Freq: Two times a day (BID) | ORAL | 6 refills | Status: DC
Start: 2017-09-17 — End: 2017-09-17

## 2017-09-17 MED ORDER — AMLODIPINE 5 MG TAB
5 mg | ORAL_TABLET | Freq: Every day | ORAL | 6 refills | Status: DC
Start: 2017-09-17 — End: 2017-09-17

## 2017-09-17 NOTE — Progress Notes (Signed)
Raymond [name], Raymond [name] [birth year], is a 72 y.o. male, who is seen today for reevaluation of atherosclerotic heart disease, hyperlipidemia, impaired fasting glucose, hypertension, and worsening renal function now.  6 months ago his kidney function was not quite as good and last week worse, he saw his cardiologist today and hydrochlorothiazide and Spironolactone were discontinued and he was started on amlodipine.  He is having no chest pain or dyspnea.  He is from his diet and otherwise feels well.    Past Medical History:   Diagnosis Date   ??? ASHD (arteriosclerotic heart disease)    ??? CABG x 3 02/2004   ??? Cardiac cath 02/29/2004    oRCA 100%.  LM patent.  p/mLAD 85%.  D1 90%.  D2 90%.  CX 45%.  LVEDP 12 mmHg.  EF 45%.  CABG recommended.   ??? Cardiac echocardiogram 11/08/2005    LVE.  EF 45-50%.  Inferior, inferolateral WMA.     ??? Cardiac nuclear imaging test, mod risk 04/11/2013    Intermediate risk.  Mild-mod basal inferior infarction w/mild-mod peri-infarct ischemia in mid inferior, prox & mid inferolateral walls.  EF unreadable due to gating error.  Neg EKG on max EST.  Ex time 6 min.     ??? Cardiovascular RLE venous duplex 08/05/2013    Right leg:  No DVT.   ??? Coronary artery disease     inferior wall myocardial infarction and stenting of the RCA 9/03; Status post CABG X3 in January 2006.   ??? Elevated prostate specific antigen (PSA)    ??? Hypercholesteremia     low HDL   ??? Hypertension    ??? Hypertension    ??? Hypertrophy of prostate with urinary obstruction and other lower urinary tract symptoms (LUTS)    ??? Microscopic hematuria    ??? Myocardial infarction, inferior wall ([place]) 10/12/01    acute   ??? Pleural effusion on right    ??? Right renal cyst    ??? Ventricular tachycardia, nonsustained (HCC)      Current Outpatient Medications   Medication Sig Dispense Refill   ??? losartan (COZAAR) 50 mg tablet Take 1 Tab by mouth two (2) times a day. 60 Tab 6   ??? amLODIPine (NORVASC) 5 mg tablet Take 1 Tab by mouth daily. 30 Tab 6    ??? simvastatin (ZOCOR) 40 mg tablet TAKE 1 TABLET BY MOUTH EVERY NIGHT 30 Tab 0   ??? carvedilol (COREG) 6.25 mg tablet TAKE 1 TABLET BY MOUTH TWICE DAILY WITH MEALS 180 Tab 0   ??? omega-3 acid ethyl esters (LOVAZA) 1 gram capsule Take 1 Cap by mouth daily. 1 Cap 0   ??? triamcinolone (NASACORT AQ) 55 mcg nasal inhaler INSTILL 2 SPRAYS INTO EACH NOSTRIL DAILY 17 g 8   ??? aspirin 81 mg tablet Take 81 mg by mouth daily.     ??? MULTIVITAMIN PO Take 1 Tab by mouth daily.       Visit Vitals  BP 114/68 (BP 1 Location: Right arm, BP Patient Position: Sitting)   Pulse 65   Temp 97.9 ??F (36.6 ??C) (Oral)   Resp 16   Ht 5' 9"  (1.753 m)   Wt 194 lb 9.6 oz (88.3 kg)   SpO2 96%   BMI 28.74 kg/m??     Carotids are 2+ without bruits.  Lungs are clear to percussion.  Good breath sounds with no wheezing or crackles.  Heart reveals a regular rhythm with normal S1 and S2 no murmur gallop click or  rub.  Apical impulse is not palpable.  Abdomen is soft and nontender with no hepatospleno megaly or masses and no bruits.  Tremors reveal no clubbing cyanosis or edema.  Pulses are 2+.    Results for orders placed or performed during the hospital encounter of 09/10/17   LIPID PANEL   Result Value Ref Range    LIPID PROFILE          Cholesterol, total 134 <200 MG/DL    Triglyceride 82 <150 MG/DL    HDL Cholesterol 51 40 - 60 MG/DL    LDL, calculated 66.6 0 - 100 MG/DL    VLDL, calculated 16.4 MG/DL    CHOL/HDL Ratio 2.6 0 - 5.0     METABOLIC PANEL, COMPREHENSIVE   Result Value Ref Range    Sodium 142 136 - 145 mmol/L    Potassium 4.3 3.5 - 5.5 mmol/L    Chloride 107 100 - 111 mmol/L    CO2 29 21 - 32 mmol/L    Anion gap 6 3.0 - 18 mmol/L    Glucose 102 (H) 74 - 99 mg/dL    BUN 21 (H) 7.0 - 18 MG/DL    Creatinine 1.66 (H) 0.6 - 1.3 MG/DL    BUN/Creatinine ratio 13 12 - 20      GFR est AA 49 (L) >60 ml/min/1.34m    GFR est non-AA 41 (L) >60 ml/min/1.734m   Calcium 8.7 8.5 - 10.1 MG/DL    Bilirubin, total 0.7 0.2 - 1.0 MG/DL     ALT (SGPT) 25 16 - 61 U/L    AST (SGOT) 16 10 - 38 U/L    Alk. phosphatase 57 45 - 117 U/L    Protein, total 6.4 6.4 - 8.2 g/dL    Albumin 3.7 3.4 - 5.0 g/dL    Globulin 2.7 2.0 - 4.0 g/dL    A-G Ratio 1.4 0.8 - 1.7     PSA SCREENING (SCREENING)   Result Value Ref Range    Prostate Specific Ag 2.9 0.0 - 4.0 ng/mL     Assessment: #1.  ASHD asymptomatic, he will continue carvedilol.  #2.  Impaired fasting glucose slightly improved, of encouraged him to work on weight loss.  #3.  Hyperlipidemia doing well, he will continue simvastatin 40 mg each evening.  #4.  Hypertension controlled but medication has been changed, that will be monitored.  #5.  Increase in creatinine from year ago and even from X months ago, hopefully the change in medication will help him, PSA is unchanged from year ago when he has no urinary symptoms he just that he might be prone to hydronephrosis.  Kidney function is not better he needs an ultrasound.    Follow-up CMP as planned on August 22, then follow-up here in 6 months for a complete evaluation.    Dominico Rod R. MaCelesta AverMD FACP    Please note:  This document has been produced using voice recognition software. Unrecognized errors in transcription may be present.

## 2017-09-17 NOTE — Progress Notes (Signed)
Raymond [name] presents today for   Chief Complaint   Patient presents with   ??? Coronary Artery Disease     6 month - no cardiac complaint       Raymond [name] preferred language for health care discussion is english/other.    Is someone accompanying this pt? no    Is the patient using any DME equipment during OV? no    Depression Screening:  3 most recent PHQ Screens 09/17/2017   Little interest or pleasure in doing things Not at all   Feeling down, depressed, irritable, or hopeless Not at all   Total Score PHQ 2 0       Learning Assessment:  Learning Assessment 09/11/2013   PRIMARY LEARNER Patient   HIGHEST LEVEL OF EDUCATION - PRIMARY LEARNER  -   BARRIERS PRIMARY LEARNER -   CO-LEARNER CAREGIVER -   PRIMARY LANGUAGE ENGLISH   INTERPRETER NEED -   LEARNER PREFERENCE PRIMARY LISTENING   ANSWERED BY patient   RELATIONSHIP SELF       Abuse Screening:  Abuse Screening Questionnaire 09/17/2017   Do you ever feel afraid of your partner? N   Are you in a relationship with someone who physically or mentally threatens you? N   Is it safe for you to go home? Y       Fall Risk  Fall Risk Assessment, last 12 mths 09/17/2017   Able to walk? Yes   Fall in past 12 months? No       Pt currently taking Anticoagulant therapy? no    Coordination of Care:  1. Have you been to the ER, urgent care clinic since your last visit? Hospitalized since your last visit? no    2. Have you seen or consulted any other health care providers outside of the [facility] since your last visit? Include any pap smears or colon screening. no

## 2017-09-17 NOTE — Progress Notes (Signed)
Raymond [name] presents today for   Chief Complaint   Patient presents with   ??? Cholesterol Problem     labs done 09/10/17              Depression Screening:  3 most recent PHQ Screens 09/17/2017   Little interest or pleasure in doing things Not at all   Feeling down, depressed, irritable, or hopeless Not at all   Total Score PHQ 2 0       Learning Assessment:  Learning Assessment 09/11/2013   PRIMARY LEARNER Patient   HIGHEST LEVEL OF EDUCATION - PRIMARY LEARNER  -   BARRIERS PRIMARY LEARNER -   CO-LEARNER CAREGIVER -   PRIMARY LANGUAGE ENGLISH   INTERPRETER NEED -   LEARNER PREFERENCE PRIMARY LISTENING   ANSWERED BY patient   RELATIONSHIP SELF       Abuse Screening:  Abuse Screening Questionnaire 09/17/2017   Do you ever feel afraid of your partner? N   Are you in a relationship with someone who physically or mentally threatens you? N   Is it safe for you to go home? Y       Fall Risk  Fall Risk Assessment, last 12 mths 09/17/2017   Able to walk? Yes   Fall in past 12 months? No           Coordination of Care:  1. Have you been to the ER, urgent care clinic since your last visit?   Hospitalized since your last visit?  No    2. Have you seen or consulted any other health care providers outside of the [facility] since your last visit?Include any pap smears or colon screening. no

## 2017-09-17 NOTE — Patient Instructions (Signed)
STOP ALDACTONE  STOP LOSARTAN-HCTZ  START  LOSARTAN 50MG  TWICE DAILY  START AMLODIPINE 5MG  ONCE DAILY  B/P BMP 2 WEEKS  FOLLOW UP  6 MONTHS

## 2017-09-17 NOTE — Progress Notes (Signed)
HISTORY OF PRESENT ILLNESS  Raymond CookeyDavid F [name] is a 72 y.o. male.    HPI  He has been feeling well. He has had no chest pain, dyspnea, orthopnea, PND.  He denies palpitations, dizziness or syncope. He has had no symptoms to indicate TIA or amaurosis fugax. He has been active. His cholesterol profile has been satisfactory and his blood pressure has been under control. His BUN and creatinine were at 21 and 1.66 on 09/10/17.     He has a history of known coronary artery disease and had an acute inferior wall myocardial infarction on October 12, 2001, and had stenting of the right coronary artery at that time. In January 2006, he started experiencing frequent palpitations and dizziness with a feeling as if he was dropping down on a roller coaster. He was [facility], [facility] with an episode of the sinking feeling and nonsustained ventricular tachycardia. He subsequently underwent cardiac catheterization followed by CABG X3 on March 03, 2004, which consisted of:   1. LIMA to the LAD.   2. Sequential SVG to the diagonal and obtuse marginal branch.   His preoperative EF was in the 45% range. He was treated with amiodarone for nonsustained ventricular tachycardia and has had no recurrence of the sinking feeling or palpitations since the bypass surgery. He developed a large right pleural effusion which was once drained, but he has had some reaccumulation. Because of the sinking feeling, he had a 24-hour Holter monitor which failed to reveal a significant cardiac arrhythmia other than PVCs. He did have the sinking feeling while he was wearing the monitor, but it did not coincide with any significant ventricular arrhythmia.   He had the follow up stress nuclear cardiac imaging on 02/17/2010 which demonstrated fixed defect involving the inferior base with no significant ischemia. Ejection fraction was estimated at 55%.  He underwent the follow up stress nuclear cardiac imaging on 04/11/13 which  demonstrated a small to moderate sized fixed defect in the basal inferior and anterolateral wall with very mild reversibility in the inferolateral wall. Because of a gating error the ejection fraction was not calculated.  He reported that the Coreg has been reduced to 6.25 mg twice a day by [employer] Devices[employer] R. Jill PolingMackinnon, MD because of the bradycardia.  He underwent a noninvasive carotid study in February of 2017, which demonstrated less than 50% stenosis in the right internal carotid artery and no significant stenosis of the left internal carotid artery.   He underwent stress nuclear cardiac imaging on 03/13/2016, which demonstrated a fixed defect in the mid and basal inferior wall with no ischemia. ??The ejection fraction was estimated at around 52%.????  Because of bradycardia he had a Holter monitor on 04/05/16 which demonstrated baseline sinus rhythm. Minimum heart rate was 41 beats per minute and average heart rate was 52 beats per minute with maximum heart rate of 94 beats per minute. There was no significant pauses. There was no significant atrial or ventricular arrhythmia. His cholesterol profile has been satisfactory.  ??  Review of Systems   Constitutional: Negative for malaise/fatigue and weight loss.   HENT: Negative for hearing loss.    Eyes: Negative for blurred vision and double vision.   Respiratory: Negative for shortness of breath.    Cardiovascular: Negative for chest pain, palpitations, orthopnea, claudication, leg swelling and PND.   Gastrointestinal: Negative for blood in stool, heartburn and melena.   Genitourinary: Positive for frequency. Negative for hematuria and urgency.  Musculoskeletal: Negative for back pain and joint pain.   Skin: Negative for itching and rash.   Neurological: Negative for dizziness and loss of consciousness.   Psychiatric/Behavioral: Negative for depression and memory loss.       Physical Exam   Constitutional: He is oriented to person, place, and time. He appears  well-developed and well-nourished.   HENT:   Head: Normocephalic and atraumatic.   Eyes: Pupils are equal, round, and reactive to light. Conjunctivae are normal.   Neck: Normal range of motion. Neck supple. No JVD present.   Cardiovascular: Normal rate, regular rhythm, S1 normal and S2 normal.  No extrasystoles are present. PMI is not displaced. Exam reveals no gallop and no friction rub.   No murmur heard.  Pulses:       Carotid pulses are 3+ on the right side, and 3+ on the left side.  Pulmonary/Chest: Effort normal. He has no rales.   Abdominal: Soft. There is no tenderness.   Musculoskeletal: He exhibits no edema.   Neurological: He is alert and oriented to person, place, and time. No cranial nerve deficit.   Skin: Skin is warm and dry.   Psychiatric: He has a normal mood and affect. His behavior is normal.     Visit Vitals  BP 128/72   Pulse (!) 55   Ht 5\' 9"  (1.753 m)   Wt 88 kg (194 lb)   SpO2 98%   BMI 28.65 kg/m??       Past Medical History:   Diagnosis Date   ??? ASHD (arteriosclerotic heart disease)    ??? CABG x 3 02/2004   ??? Cardiac cath 02/29/2004    oRCA 100%.  LM patent.  p/mLAD 85%.  D1 90%.  D2 90%.  CX 45%.  LVEDP 12 mmHg.  EF 45%.  CABG recommended.   ??? Cardiac echocardiogram 11/08/2005    LVE.  EF 45-50%.  Inferior, inferolateral WMA.     ??? Cardiac nuclear imaging test, mod risk 04/11/2013    Intermediate risk.  Mild-mod basal inferior infarction w/mild-mod peri-infarct ischemia in mid inferior, prox & mid inferolateral walls.  EF unreadable due to gating error.  Neg EKG on max EST.  Ex time 6 min.     ??? Cardiovascular RLE venous duplex 08/05/2013    Right leg:  No DVT.   ??? Coronary artery disease     inferior wall myocardial infarction and stenting of the RCA 9/03; Status post CABG X3 in January 2006.   ??? Elevated prostate specific antigen (PSA)    ??? Hypercholesteremia     low HDL   ??? Hypertension    ??? Hypertension    ??? Hypertrophy of prostate with urinary obstruction and other lower urinary  tract symptoms (LUTS)    ??? Microscopic hematuria    ??? 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 Pleural effusion on right    ??? Right renal cyst    ??? Ventricular tachycardia, nonsustained (HCC)        Social History     Socioeconomic History   ??? Marital status: MARRIED     Spouse name: Not on file   ??? Number of children: Not on file   ??? Years of education: Not on file   ??? Highest education level: Not on file   Occupational History   ??? Not on file   Social Needs   ??? Financial resource strain: Not on file   ??? Food insecurity:  Worry: Not on file     Inability: Not on file   ??? Transportation needs:     Medical: Not on file     Non-medical: Not on file   Tobacco Use   ??? Smoking status: Never Smoker   ??? Smokeless tobacco: Never Used   Substance and Sexual Activity   ??? Alcohol use: Yes     Comment: social   ??? Drug use: No   ??? Sexual activity: Not on file   Lifestyle   ??? Physical activity:     Days per week: Not on file     Minutes per session: Not on file   ??? Stress: Not on file   Relationships   ??? Social connections:     Talks on phone: Not on file     Gets together: Not on file     Attends religious service: Not on file     Active member of club or organization: Not on file     Attends meetings of clubs or organizations: Not on file     Relationship status: Not on file   ??? Intimate partner violence:     Fear of current or ex partner: Not on file     Emotionally abused: Not on file     Physically abused: Not on file     Forced sexual activity: Not on file   Other Topics Concern   ??? Not on file   Social History Narrative   ??? Not on file       Family History   Problem Relation Age of Onset   ??? Heart Disease Father         x3 heart bypass   ??? Arthritis-osteo Mother    ??? Hypertension Sister        Past Surgical History:   Procedure Laterality Date   ??? HX CORONARY ARTERY BYPASS GRAFT  03/03/04    LIMA to the LAD. Sequential SVG to the diagonal and obtuse marginal branch    ??? HX CORONARY STENT PLACEMENT  10/12/01    RCA stented using 3.5 x 13 mm Express 2 stent   ??? HX HEART CATHETERIZATION  03/03/04    followed by CABG X3   ??? HX TONSILLECTOMY     ??? STENT INSERTION  10/12/01    right coronary artery   ??? VASCULAR SURGERY PROCEDURE UNLIST      biopsy of mass in abdomen, benign       Current Outpatient Medications   Medication Sig Dispense Refill   ??? simvastatin (ZOCOR) 40 mg tablet TAKE 1 TABLET BY MOUTH EVERY NIGHT 30 Tab 0   ??? carvedilol (COREG) 6.25 mg tablet TAKE 1 TABLET BY MOUTH TWICE DAILY WITH MEALS 180 Tab 0   ??? losartan-hydroCHLOROthiazide (HYZAAR) 100-12.5 mg per tablet TAKE 1 TABLET BY MOUTH DAILY 90 Tab 0   ??? spironolactone (ALDACTONE) 25 mg tablet TAKE 1 TABLET BY MOUTH DAILY 30 Tab 6   ??? omega-3 acid ethyl esters (LOVAZA) 1 gram capsule Take 1 Cap by mouth daily. 1 Cap 0   ??? triamcinolone (NASACORT AQ) 55 mcg nasal inhaler INSTILL 2 SPRAYS INTO EACH NOSTRIL DAILY 17 g 8   ??? aspirin 81 mg tablet Take 81 mg by mouth daily.     ??? MULTIVITAMIN PO Take 1 Tab by mouth daily.         EKG: unchanged from previous tracings, sinus bradycardia, RBBB, inf.scar  .  ASSESSMENT and PLAN  Encounter Diagnoses  Name Primary?   ??? Atherosclerosis of native coronary artery of native heart without angina pectoris,CABGx3 2006,EF 45% Yes   ??? Bradycardia    ??? Nonsustained ventricular tachycardia (HCC)    ??? Essential hypertension     ??? Hypercholesteremia    ??? Renal dysfunction    He has been doing well from a cardiac standpoint. He has had no symptoms to indicate angina or cardiac decompensation. His cholesterol profile is satisfactory and his blood pressure has been under control. He does have mild renal dysfunction with creatinine now up to 1.66. At this time, I will try to stop Aldactone and eliminate HCTZ from the losartan/HCTZ and add amlodipine to see if we could improve the renal function and maintain good blood pressure control. He was advised that he might have some leg  edema from the amlodipine. I am unable to increase Coreg because of the resting bradycardia.

## 2017-09-17 NOTE — Progress Notes (Signed)
Raymond [name], Raymond [name] October 04, 1945, is a 72 y.o. male, who is seen today for reevaluation of atherosclerotic heart disease, hyperlipidemia, impaired fasting glucose, hypertension, and worsening renal function now.  6 months ago his kidney function was not quite as good and last week worse, he saw his cardiologist today and hydrochlorothiazide and Spironolactone were discontinued and he was started on amlodipine.  He is having no chest pain or dyspnea.  He is from his diet and otherwise feels well.    Past Medical History:   Diagnosis Date   ??? ASHD (arteriosclerotic heart disease)    ??? CABG x 3 02/2004   ??? Cardiac cath 02/29/2004    oRCA 100%.  LM patent.  p/mLAD 85%.  D1 90%.  D2 90%.  CX 45%.  LVEDP 12 mmHg.  EF 45%.  CABG recommended.   ??? Cardiac echocardiogram 11/08/2005    LVE.  EF 45-50%.  Inferior, inferolateral WMA.     ??? Cardiac nuclear imaging test, mod risk 04/11/2013    Intermediate risk.  Mild-mod basal inferior infarction w/mild-mod peri-infarct ischemia in mid inferior, prox & mid inferolateral walls.  EF unreadable due to gating error.  Neg EKG on max EST.  Ex time 6 min.     ??? Cardiovascular RLE venous duplex 08/05/2013    Right leg:  No DVT.   ??? Coronary artery disease     inferior wall myocardial infarction and stenting of the RCA 9/03; Status post CABG X3 in January 2006.   ??? Elevated prostate specific antigen (PSA)    ??? Hypercholesteremia     low HDL   ??? Hypertension    ??? Hypertension    ??? Hypertrophy of prostate with urinary obstruction and other lower urinary tract symptoms (LUTS)    ??? Microscopic hematuria    ??? Myocardial infarction, inferior wall ([place]) 10/12/01    acute   ??? Pleural effusion on right    ??? Right renal cyst    ??? Ventricular tachycardia, nonsustained (HCC)      Current Outpatient Medications   Medication Sig Dispense Refill   ??? losartan (COZAAR) 50 mg tablet Take 1 Tab by mouth two (2) times a day. 60 Tab 6   ??? amLODIPine (NORVASC) 5 mg tablet Take 1 Tab by mouth daily. 30 Tab 6   ???  simvastatin (ZOCOR) 40 mg tablet TAKE 1 TABLET BY MOUTH EVERY NIGHT 30 Tab 0   ??? carvedilol (COREG) 6.25 mg tablet TAKE 1 TABLET BY MOUTH TWICE DAILY WITH MEALS 180 Tab 0   ??? omega-3 acid ethyl esters (LOVAZA) 1 gram capsule Take 1 Cap by mouth daily. 1 Cap 0   ??? triamcinolone (NASACORT AQ) 55 mcg nasal inhaler INSTILL 2 SPRAYS INTO EACH NOSTRIL DAILY 17 g 8   ??? aspirin 81 mg tablet Take 81 mg by mouth daily.     ??? MULTIVITAMIN PO Take 1 Tab by mouth daily.       Visit Vitals  BP 114/68 (BP 1 Location: Right arm, BP Patient Position: Sitting)   Pulse 65   Temp 97.9 ??F (36.6 ??C) (Oral)   Resp 16   Ht 5' 9" (1.753 m)   Wt 194 lb 9.6 oz (88.3 kg)   SpO2 96%   BMI 28.74 kg/m??     Carotids are 2+ without bruits.  Lungs are clear to percussion.  Good breath sounds with no wheezing or crackles.  Heart reveals a regular rhythm with normal S1 and S2 no murmur gallop click or  rub.  Apical impulse is not palpable.  Abdomen is soft and nontender with no hepatospleno megaly or masses and no bruits.  Tremors reveal no clubbing cyanosis or edema.  Pulses are 2+.    Results for orders placed or performed during the hospital encounter of 09/10/17   LIPID PANEL   Result Value Ref Range    LIPID PROFILE          Cholesterol, total 134 <200 MG/DL    Triglyceride 82 <150 MG/DL    HDL Cholesterol 51 40 - 60 MG/DL    LDL, calculated 66.6 0 - 100 MG/DL    VLDL, calculated 16.4 MG/DL    CHOL/HDL Ratio 2.6 0 - 5.0     METABOLIC PANEL, COMPREHENSIVE   Result Value Ref Range    Sodium 142 136 - 145 mmol/L    Potassium 4.3 3.5 - 5.5 mmol/L    Chloride 107 100 - 111 mmol/L    CO2 29 21 - 32 mmol/L    Anion gap 6 3.0 - 18 mmol/L    Glucose 102 (H) 74 - 99 mg/dL    BUN 21 (H) 7.0 - 18 MG/DL    Creatinine 1.66 (H) 0.6 - 1.3 MG/DL    BUN/Creatinine ratio 13 12 - 20      GFR est AA 49 (L) >60 ml/min/1.26m    GFR est non-AA 41 (L) >60 ml/min/1.727m   Calcium 8.7 8.5 - 10.1 MG/DL    Bilirubin, total 0.7 0.2 - 1.0 MG/DL    ALT (SGPT) 25 16 - 61 U/L     AST (SGOT) 16 10 - 38 U/L    Alk. phosphatase 57 45 - 117 U/L    Protein, total 6.4 6.4 - 8.2 g/dL    Albumin 3.7 3.4 - 5.0 g/dL    Globulin 2.7 2.0 - 4.0 g/dL    A-G Ratio 1.4 0.8 - 1.7     PSA SCREENING (SCREENING)   Result Value Ref Range    Prostate Specific Ag 2.9 0.0 - 4.0 ng/mL     Assessment: #1.  ASHD asymptomatic, he will continue carvedilol.  #2.  Impaired fasting glucose slightly improved, of encouraged him to work on weight loss.  #3.  Hyperlipidemia doing well, he will continue simvastatin 40 mg each evening.  #4.  Hypertension controlled but medication has been changed, that will be monitored.  #5.  Increase in creatinine from year ago and even from X months ago, hopefully the change in medication will help him, PSA is unchanged from year ago when he has no urinary symptoms he just that he might be prone to hydronephrosis.  Kidney function is not better he needs an ultrasound.    Follow-up CMP as planned on August 22, then follow-up here in 6 months for a complete evaluation.    Siyona Coto R. MaCelesta AverMD FACP    Please note:  This document has been produced using voice recognition software. Unrecognized errors in transcription may be present.

## 2017-09-17 NOTE — Progress Notes (Signed)
Raymond [name] presents today for   Chief Complaint   Patient presents with   . Cholesterol Problem     labs done 09/10/17              Depression Screening:  3 most recent PHQ Screens 09/17/2017   Little interest or pleasure in doing things Not at all   Feeling down, depressed, irritable, or hopeless Not at all   Total Score PHQ 2 0       Learning Assessment:  Learning Assessment 09/11/2013   PRIMARY LEARNER Patient   HIGHEST LEVEL OF EDUCATION - PRIMARY LEARNER  -   BARRIERS PRIMARY LEARNER -   CO-LEARNER CAREGIVER -   PRIMARY LANGUAGE ENGLISH   INTERPRETER NEED -   LEARNER PREFERENCE PRIMARY LISTENING   ANSWERED BY patient   RELATIONSHIP SELF       Abuse Screening:  Abuse Screening Questionnaire 09/17/2017   Do you ever feel afraid of your partner? N   Are you in a relationship with someone who physically or mentally threatens you? N   Is it safe for you to go home? Y       Fall Risk  Fall Risk Assessment, last 12 mths 09/17/2017   Able to walk? Yes   Fall in past 12 months? No           Coordination of Care:  1. Have you been to the ER, urgent care clinic since your last visit?   Hospitalized since your last visit?  No    2. Have you seen or consulted any other health care providers outside of the [facility] since your last visit?Include any pap smears or colon screening. no

## 2017-09-17 NOTE — Progress Notes (Signed)
HISTORY OF PRESENT ILLNESS  Raymond [name] is a 72 y.o. male.    HPI  He has been feeling well. He has had no chest pain, dyspnea, orthopnea, PND.  He denies palpitations, dizziness or syncope. He has had no symptoms to indicate TIA or amaurosis fugax. He has been active. His cholesterol profile has been satisfactory and his blood pressure has been under control. His BUN and creatinine were at 21 and 1.66 on 09/10/17.     He has a history of known coronary artery disease and had an acute inferior wall myocardial infarction on October 12, 2001, and had stenting of the right coronary artery at that time. In January 2006, he started experiencing frequent palpitations and dizziness with a feeling as if he was dropping down on a roller coaster. He was admitted to [facility] on February 27, 2004 with an episode of the sinking feeling and nonsustained ventricular tachycardia. He subsequently underwent cardiac catheterization followed by CABG X3 on March 03, 2004, which consisted of:   1. LIMA to the LAD.   2. Sequential SVG to the diagonal and obtuse marginal branch.   His preoperative EF was in the 45% range. He was treated with amiodarone for nonsustained ventricular tachycardia and has had no recurrence of the sinking feeling or palpitations since the bypass surgery. He developed a large right pleural effusion which was once drained, but he has had some reaccumulation. Because of the sinking feeling, he had a 24-hour Holter monitor which failed to reveal a significant cardiac arrhythmia other than PVCs. He did have the sinking feeling while he was wearing the monitor, but it did not coincide with any significant ventricular arrhythmia.   He had the follow up stress nuclear cardiac imaging on 02/17/2010 which demonstrated fixed defect involving the inferior base with no significant ischemia. Ejection fraction was estimated at 55%.  He underwent the follow up stress nuclear cardiac imaging on 04/11/13 which  demonstrated a small to moderate sized fixed defect in the basal inferior and anterolateral wall with very mild reversibility in the inferolateral wall. Because of a gating error the ejection fraction was not calculated.  He reported that the Coreg has been reduced to 6.25 mg twice a day by [employer] Devices[employer]ick R. Jill PolingMackinnon, MD because of the bradycardia.  He underwent a noninvasive carotid study in February of 2017, which demonstrated less than 50% stenosis in the right internal carotid artery and no significant stenosis of the left internal carotid artery.   He underwent stress nuclear cardiac imaging on 03/13/2016, which demonstrated a fixed defect in the mid and basal inferior wall with no ischemia. ??The ejection fraction was estimated at around 52%.????  Because of bradycardia he had a Holter monitor on 04/05/16 which demonstrated baseline sinus rhythm. Minimum heart rate was 41 beats per minute and average heart rate was 52 beats per minute with maximum heart rate of 94 beats per minute. There was no significant pauses. There was no significant atrial or ventricular arrhythmia. His cholesterol profile has been satisfactory.  ??  Review of Systems   Constitutional: Negative for malaise/fatigue and weight loss.   HENT: Negative for hearing loss.    Eyes: Negative for blurred vision and double vision.   Respiratory: Negative for shortness of breath.    Cardiovascular: Negative for chest pain, palpitations, orthopnea, claudication, leg swelling and PND.   Gastrointestinal: Negative for blood in stool, heartburn and melena.   Genitourinary: Positive for frequency. Negative for hematuria and urgency.  Musculoskeletal: Negative for back pain and joint pain.   Skin: Negative for itching and rash.   Neurological: Negative for dizziness and loss of consciousness.   Psychiatric/Behavioral: Negative for depression and memory loss.       Physical Exam   Constitutional: He is oriented to person, place, and time. He appears well-developed  and well-nourished.   HENT:   Head: Normocephalic and atraumatic.   Eyes: Pupils are equal, round, and reactive to light. Conjunctivae are normal.   Neck: Normal range of motion. Neck supple. No JVD present.   Cardiovascular: Normal rate, regular rhythm, S1 normal and S2 normal.  No extrasystoles are present. PMI is not displaced. Exam reveals no gallop and no friction rub.   No murmur heard.  Pulses:       Carotid pulses are 3+ on the right side, and 3+ on the left side.  Pulmonary/Chest: Effort normal. He has no rales.   Abdominal: Soft. There is no tenderness.   Musculoskeletal: He exhibits no edema.   Neurological: He is alert and oriented to person, place, and time. No cranial nerve deficit.   Skin: Skin is warm and dry.   Psychiatric: He has a normal mood and affect. His behavior is normal.     Visit Vitals  BP 128/72   Pulse (!) 55   Ht 5\' 9"  (1.753 m)   Wt 88 kg (194 lb)   SpO2 98%   BMI 28.65 kg/m??       Past Medical History:   Diagnosis Date   ??? ASHD (arteriosclerotic heart disease)    ??? CABG x 3 02/2004   ??? Cardiac cath 02/29/2004    oRCA 100%.  LM patent.  p/mLAD 85%.  D1 90%.  D2 90%.  CX 45%.  LVEDP 12 mmHg.  EF 45%.  CABG recommended.   ??? Cardiac echocardiogram 11/08/2005    LVE.  EF 45-50%.  Inferior, inferolateral WMA.     ??? Cardiac nuclear imaging test, mod risk 04/11/2013    Intermediate risk.  Mild-mod basal inferior infarction w/mild-mod peri-infarct ischemia in mid inferior, prox & mid inferolateral walls.  EF unreadable due to gating error.  Neg EKG on max EST.  Ex time 6 min.     ??? Cardiovascular RLE venous duplex 08/05/2013    Right leg:  No DVT.   ??? Coronary artery disease     inferior wall myocardial infarction and stenting of the RCA 9/03; Status post CABG X3 in January 2006.   ??? Elevated prostate specific antigen (PSA)    ??? Hypercholesteremia     low HDL   ??? Hypertension    ??? Hypertension    ??? Hypertrophy of prostate with urinary obstruction and other lower urinary tract symptoms (LUTS)     ??? Microscopic hematuria    ??? 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 Pleural effusion on right    ??? Right renal cyst    ??? Ventricular tachycardia, nonsustained (HCC)        Social History     Socioeconomic History   ??? Marital status: MARRIED     Spouse name: Not on file   ??? Number of children: Not on file   ??? Years of education: Not on file   ??? Highest education level: Not on file   Occupational History   ??? Not on file   Social Needs   ??? Financial resource strain: Not on file   ??? Food insecurity:  Worry: Not on file     Inability: Not on file   ??? Transportation needs:     Medical: Not on file     Non-medical: Not on file   Tobacco Use   ??? Smoking status: Never Smoker   ??? Smokeless tobacco: Never Used   Substance and Sexual Activity   ??? Alcohol use: Yes     Comment: social   ??? Drug use: No   ??? Sexual activity: Not on file   Lifestyle   ??? Physical activity:     Days per week: Not on file     Minutes per session: Not on file   ??? Stress: Not on file   Relationships   ??? Social connections:     Talks on phone: Not on file     Gets together: Not on file     Attends religious service: Not on file     Active member of club or organization: Not on file     Attends meetings of clubs or organizations: Not on file     Relationship status: Not on file   ??? Intimate partner violence:     Fear of current or ex partner: Not on file     Emotionally abused: Not on file     Physically abused: Not on file     Forced sexual activity: Not on file   Other Topics Concern   ??? Not on file   Social History Narrative   ??? Not on file       Family History   Problem Relation Age of Onset   ??? Heart Disease Father         x3 heart bypass   ??? Arthritis-osteo Mother    ??? Hypertension Sister        Past Surgical History:   Procedure Laterality Date   ??? HX CORONARY ARTERY BYPASS GRAFT  03/03/04    LIMA to the LAD. Sequential SVG to the diagonal and obtuse marginal branch   ??? HX CORONARY STENT PLACEMENT  10/12/01    RCA stented  using 3.5 x 13 mm Express 2 stent   ??? HX HEART CATHETERIZATION  03/03/04    followed by CABG X3   ??? HX TONSILLECTOMY     ??? STENT INSERTION  10/12/01    right coronary artery   ??? VASCULAR SURGERY PROCEDURE UNLIST      biopsy of mass in abdomen, benign       Current Outpatient Medications   Medication Sig Dispense Refill   ??? simvastatin (ZOCOR) 40 mg tablet TAKE 1 TABLET BY MOUTH EVERY NIGHT 30 Tab 0   ??? carvedilol (COREG) 6.25 mg tablet TAKE 1 TABLET BY MOUTH TWICE DAILY WITH MEALS 180 Tab 0   ??? losartan-hydroCHLOROthiazide (HYZAAR) 100-12.5 mg per tablet TAKE 1 TABLET BY MOUTH DAILY 90 Tab 0   ??? spironolactone (ALDACTONE) 25 mg tablet TAKE 1 TABLET BY MOUTH DAILY 30 Tab 6   ??? omega-3 acid ethyl esters (LOVAZA) 1 gram capsule Take 1 Cap by mouth daily. 1 Cap 0   ??? triamcinolone (NASACORT AQ) 55 mcg nasal inhaler INSTILL 2 SPRAYS INTO EACH NOSTRIL DAILY 17 g 8   ??? aspirin 81 mg tablet Take 81 mg by mouth daily.     ??? MULTIVITAMIN PO Take 1 Tab by mouth daily.         EKG: unchanged from previous tracings, sinus bradycardia, RBBB, inf.scar  .  ASSESSMENT and PLAN  Encounter Diagnoses  Name Primary?   ??? Atherosclerosis of native coronary artery of native heart without angina pectoris,CABGx3 2006,EF 45% Yes   ??? Bradycardia    ??? Nonsustained ventricular tachycardia (HCC)    ??? Essential hypertension     ??? Hypercholesteremia    ??? Renal dysfunction    He has been doing well from a cardiac standpoint. He has had no symptoms to indicate angina or cardiac decompensation. His cholesterol profile is satisfactory and his blood pressure has been under control. He does have mild renal dysfunction with creatinine now up to 1.66. At this time, I will try to stop Aldactone and eliminate HCTZ from the losartan/HCTZ and add amlodipine to see if we could improve the renal function and maintain good blood pressure control. He was advised that he might have some leg edema from the amlodipine. I am unable to increase Coreg because of the  resting bradycardia.

## 2017-09-17 NOTE — Progress Notes (Signed)
 Raymond [name] presents today for   Chief Complaint   Patient presents with   . Coronary Artery Disease     6 month - no cardiac complaint       Raymond [name] preferred language for health care discussion is english/other.    Is someone accompanying this pt? no    Is the patient using any DME equipment during OV? no    Depression Screening:  3 most recent PHQ Screens 09/17/2017   Little interest or pleasure in doing things Not at all   Feeling down, depressed, irritable, or hopeless Not at all   Total Score PHQ 2 0       Learning Assessment:  Learning Assessment 09/11/2013   PRIMARY LEARNER Patient   HIGHEST LEVEL OF EDUCATION - PRIMARY LEARNER  -   BARRIERS PRIMARY LEARNER -   CO-LEARNER CAREGIVER -   PRIMARY LANGUAGE ENGLISH   INTERPRETER NEED -   LEARNER PREFERENCE PRIMARY LISTENING   ANSWERED BY patient   RELATIONSHIP SELF       Abuse Screening:  Abuse Screening Questionnaire 09/17/2017   Do you ever feel afraid of your partner? N   Are you in a relationship with someone who physically or mentally threatens you? N   Is it safe for you to go home? Y       Fall Risk  Fall Risk Assessment, last 12 mths 09/17/2017   Able to walk? Yes   Fall in past 12 months? No       Pt currently taking Anticoagulant therapy? no    Coordination of Care:  1. Have you been to the ER, urgent care clinic since your last visit? Hospitalized since your last visit? no    2. Have you seen or consulted any other health care providers outside of the [facility] since your last visit? Include any pap smears or colon screening. no

## 2017-09-18 MED ORDER — LOSARTAN 50 MG TAB
50 mg | ORAL_TABLET | ORAL | 6 refills | Status: DC
Start: 2017-09-18 — End: 2018-04-08

## 2017-09-18 MED ORDER — AMLODIPINE 5 MG TAB
5 mg | ORAL_TABLET | ORAL | 6 refills | Status: DC
Start: 2017-09-18 — End: 2018-04-08

## 2017-09-26 MED ORDER — OMEGA-3 ACID ETHYL ESTERS 1 GRAM CAP
1 gram | ORAL_CAPSULE | ORAL | 3 refills | Status: DC
Start: 2017-09-26 — End: 2019-02-17

## 2017-09-27 ENCOUNTER — Inpatient Hospital Stay: Admit: 2017-09-27 | Payer: MEDICARE | Primary: Internal Medicine

## 2017-09-27 ENCOUNTER — Encounter: Admit: 2017-09-27 | Discharge: 2017-09-27 | Payer: PRIVATE HEALTH INSURANCE | Primary: Internal Medicine

## 2017-09-27 DIAGNOSIS — I10 Essential (primary) hypertension: Secondary | ICD-10-CM

## 2017-09-27 LAB — METABOLIC PANEL, BASIC
Anion gap: 4 mmol/L (ref 3.0–18)
BUN/Creatinine ratio: 15 (ref 12–20)
BUN: 21 MG/DL — ABNORMAL HIGH (ref 7.0–18)
CO2: 28 mmol/L (ref 21–32)
Calcium: 8.2 MG/DL — ABNORMAL LOW (ref 8.5–10.1)
Chloride: 109 mmol/L (ref 100–111)
Creatinine: 1.36 MG/DL — ABNORMAL HIGH (ref 0.6–1.3)
GFR est AA: >60 mL/min/{1.73_m2} (ref >60)
GFR est non-AA: 51 mL/min/{1.73_m2} — ABNORMAL LOW (ref >60)
Glucose: 97 mg/dL (ref 74–99)
Potassium: 4.1 mmol/L (ref 3.5–5.5)
Sodium: 141 mmol/L (ref 136–145)

## 2017-09-27 LAB — BASIC METABOLIC PANEL
Anion Gap: 4 mmol/L (ref 3.0–18)
BUN: 21 MG/DL — ABNORMAL HIGH (ref 7.0–18)
Bun/Cre Ratio: 15 (ref 12–20)
CO2: 28 mmol/L (ref 21–32)
Calcium: 8.2 MG/DL — ABNORMAL LOW (ref 8.5–10.1)
Chloride: 109 mmol/L (ref 100–111)
Creatinine: 1.36 MG/DL — ABNORMAL HIGH (ref 0.6–1.3)
EGFR IF NonAfrican American: 51 mL/min/{1.73_m2} — ABNORMAL LOW (ref >60)
GFR African American: >60 mL/min/{1.73_m2} (ref >60)
Glucose: 97 mg/dL (ref 74–99)
Potassium: 4.1 mmol/L (ref 3.5–5.5)
Sodium: 141 mmol/L (ref 136–145)

## 2017-09-27 NOTE — Progress Notes (Signed)
Please notify the patient that his kidney function test is much better, about the same as it was 6 months ago.

## 2017-09-28 ENCOUNTER — Institutional Professional Consult (permissible substitution): Admit: 2017-09-28 | Payer: PRIVATE HEALTH INSURANCE | Primary: Internal Medicine

## 2017-09-28 DIAGNOSIS — I10 Essential (primary) hypertension: Secondary | ICD-10-CM

## 2017-09-28 NOTE — Progress Notes (Signed)
Please notify the patient that his kidney function test is much better, about the same as it was 6 months ago.

## 2017-09-28 NOTE — Progress Notes (Signed)
Raymond CookeyDavid F Wohl is a 72 y.o. male that is here for a blood pressure check. His current medications are listed below.     Current Outpatient Medications   Medication Sig   ??? omega-3 acid ethyl esters (LOVAZA) 1 gram capsule TAKE 2 CAPSULES BY MOUTH TWICE DAILY   ??? amLODIPine (NORVASC) 5 mg tablet TAKE 1 TABLET BY MOUTH DAILY   ??? losartan (COZAAR) 50 mg tablet TAKE 1 TABLET BY MOUTH TWICE DAILY   ??? simvastatin (ZOCOR) 40 mg tablet TAKE 1 TABLET BY MOUTH EVERY NIGHT   ??? carvedilol (COREG) 6.25 mg tablet TAKE 1 TABLET BY MOUTH TWICE DAILY WITH MEALS   ??? omega-3 acid ethyl esters (LOVAZA) 1 gram capsule Take 1 Cap by mouth daily.   ??? triamcinolone (NASACORT AQ) 55 mcg nasal inhaler INSTILL 2 SPRAYS INTO EACH NOSTRIL DAILY   ??? aspirin 81 mg tablet Take 81 mg by mouth daily.   ??? MULTIVITAMIN PO Take 1 Tab by mouth daily.     No current facility-administered medications for this visit.                His   Visit Vitals  BP 138/68   Pulse (!) 54   Ht 5\' 9"  (1.753 m)   Wt 90.7 kg (200 lb)   SpO2 97%   BMI 29.53 kg/m??     Patient was seen today for a bp check after stopping Aldactone and Losartan/HCTZ due to impaired kidney function. He [facility] to assess. He was then started on Amlodipine 5 mg and Losartan 50 mg twice daily.   Component Value Flag Ref Range Units Status   Sodium 141   136 - 145 mmol/L Final   Potassium 4.1   3.5 - 5.5 mmol/L Final   Chloride 109   100 - 111 mmol/L Final   CO2 28   21 - 32 mmol/L Final   Anion gap 4   3.0 - 18 mmol/L Final   Glucose 97   74 - 99 mg/dL Final   BUN 21  High   7.0 - 18 MG/DL Final   Creatinine 0.861.36  High   0.6 - 1.3 MG/DL Final   BUN/Creatinine ratio 15   12 - 20 ?? Final   GFR est AA >60   >60 ml/min/1.4473m2 Final   GFR est non-AA 51  Low   >60 ml/min/1.5273m2 Final   Comment:      Calcium 8.2  Low   8.5 - 10.1       Patient denies having any cardiac symptoms such as chest pain, palpitations, dizziness, SOB. Patient has lower extremity/feet edema at +1  and has gained 6 lbs since last office visit on 09/17/2017.    Will forward to Dr Junius Finnerhough for review. Patient will be on vacation week ok September 30, 2017 to Labor day weekend will not be available at home. He may be reached at daughter's phone number Dorene Grebe(Natalie) [phone]

## 2017-09-28 NOTE — Telephone Encounter (Signed)
Called and spoke to patient about message below.  Patient verbalized understanding and didn't have any additional questions.

## 2017-09-28 NOTE — Telephone Encounter (Signed)
-----   Message from Normand Sloopoderick R Mackinnon, MD sent at 09/28/2017  7:34 AM EDT -----  Please notify the patient that his kidney function test is much better, about the same as it was 6 months ago.

## 2017-09-28 NOTE — Progress Notes (Signed)
 Raymond [name] is a 72 y.o. male that is here for a blood pressure check. His current medications are listed below.     Current Outpatient Medications   Medication Sig   . omega-3 acid ethyl esters (LOVAZA) 1 gram capsule TAKE 2 CAPSULES BY MOUTH TWICE DAILY   . amLODIPine  (NORVASC ) 5 mg tablet TAKE 1 TABLET BY MOUTH DAILY   . losartan  (COZAAR ) 50 mg tablet TAKE 1 TABLET BY MOUTH TWICE DAILY   . simvastatin  (ZOCOR ) 40 mg tablet TAKE 1 TABLET BY MOUTH EVERY NIGHT   . carvedilol  (COREG ) 6.25 mg tablet TAKE 1 TABLET BY MOUTH TWICE DAILY WITH MEALS   . omega-3 acid ethyl esters (LOVAZA) 1 gram capsule Take 1 Cap by mouth daily.   SABRA triamcinolone (NASACORT AQ) 55 mcg nasal inhaler INSTILL 2 SPRAYS INTO EACH NOSTRIL DAILY   . aspirin 81 mg tablet Take 81 mg by mouth daily.   . MULTIVITAMIN PO Take 1 Tab by mouth daily.     No current facility-administered medications for this visit.                His   Visit Vitals  BP 138/68   Pulse (!) 54   Ht 5' 9 (1.753 m)   Wt 90.7 kg (200 lb)   SpO2 97%   BMI 29.53 kg/m     Patient was seen today for a bp check after stopping Aldactone and Losartan /HCTZ due to impaired kidney function. He had blood work at [facility] to assess. He was then started on Amlodipine  5 mg and Losartan  50 mg twice daily.   Component Value Flag Ref Range Units Status   Sodium 141   136 - 145 mmol/L Final   Potassium 4.1   3.5 - 5.5 mmol/L Final   Chloride 109   100 - 111 mmol/L Final   CO2 28   21 - 32 mmol/L Final   Anion gap 4   3.0 - 18 mmol/L Final   Glucose 97   74 - 99 mg/dL Final   BUN 21  High   7.0 - 18 MG/DL Final   Creatinine 8.63  High   0.6 - 1.3 MG/DL Final   BUN/Creatinine ratio 15   12 - 20  Final   GFR est AA >60   >60 ml/min/1.78m2 Final   GFR est non-AA 51  Low   >60 ml/min/1.44m2 Final   Comment:      Calcium 8.2  Low   8.5 - 10.1       Patient denies having any cardiac symptoms such as chest pain, palpitations, dizziness, SOB. Patient has lower extremity/feet edema at +1 and has gained 6  lbs since last office visit on 09/17/2017.    Will forward to Dr Jolene for review. Patient will be on vacation week ok September 30, 2017 to Labor day weekend will not be available at home. He may be reached at daughter's phone number [name]) [phone]

## 2017-10-09 MED ORDER — SIMVASTATIN 40 MG TAB
40 mg | ORAL_TABLET | ORAL | 0 refills | Status: DC
Start: 2017-10-09 — End: 2017-11-07

## 2017-10-15 MED ORDER — CARVEDILOL 6.25 MG TAB
6.25 mg | ORAL_TABLET | ORAL | 0 refills | Status: DC
Start: 2017-10-15 — End: 2018-01-14

## 2017-11-07 MED ORDER — SIMVASTATIN 40 MG TAB
40 mg | ORAL_TABLET | ORAL | 3 refills | Status: DC
Start: 2017-11-07 — End: 2018-10-29

## 2018-01-14 MED ORDER — CARVEDILOL 6.25 MG TAB
6.25 mg | ORAL_TABLET | ORAL | 0 refills | Status: DC
Start: 2018-01-14 — End: 2018-04-19

## 2018-03-18 ENCOUNTER — Inpatient Hospital Stay: Admit: 2018-03-18 | Payer: MEDICARE | Primary: Internal Medicine

## 2018-03-18 ENCOUNTER — Encounter

## 2018-03-18 ENCOUNTER — Encounter: Admit: 2018-03-18 | Discharge: 2018-03-18 | Payer: PRIVATE HEALTH INSURANCE | Primary: Internal Medicine

## 2018-03-18 DIAGNOSIS — E7800 Pure hypercholesterolemia, unspecified: Secondary | ICD-10-CM

## 2018-03-18 DIAGNOSIS — E785 Hyperlipidemia, unspecified: Secondary | ICD-10-CM

## 2018-03-19 LAB — METABOLIC PANEL, COMPREHENSIVE
A-G Ratio: 1.2 (ref 0.8–1.7)
ALT (SGPT): 24 U/L (ref 16–61)
AST (SGOT): 16 U/L (ref 10–38)
Albumin: 3.6 g/dL (ref 3.4–5.0)
Alk. phosphatase: 63 U/L (ref 45–117)
Anion gap: 2 mmol/L — ABNORMAL LOW (ref 3.0–18)
BUN/Creatinine ratio: 20 (ref 12–20)
BUN: 25 MG/DL — ABNORMAL HIGH (ref 7.0–18)
Bilirubin, total: 0.6 MG/DL (ref 0.2–1.0)
CO2: 28 mmol/L (ref 21–32)
Calcium: 8.3 MG/DL — ABNORMAL LOW (ref 8.5–10.1)
Chloride: 111 mmol/L (ref 100–111)
Creatinine: 1.28 MG/DL (ref 0.6–1.3)
GFR est AA: >60 mL/min/{1.73_m2} (ref >60)
GFR est non-AA: 55 mL/min/{1.73_m2} — ABNORMAL LOW (ref >60)
Globulin: 2.9 g/dL (ref 2.0–4.0)
Glucose: 96 mg/dL (ref 74–99)
Potassium: 3.8 mmol/L (ref 3.5–5.5)
Protein, total: 6.5 g/dL (ref 6.4–8.2)
Sodium: 141 mmol/L (ref 136–145)

## 2018-03-19 LAB — LIPID PANEL
CHOL/HDL Ratio: 2.4 (ref 0–5.0)
Chol/HDL Ratio: 2.4 (ref 0–5.0)
Cholesterol, Total: 137 MG/DL (ref <200)
Cholesterol, total: 137 MG/DL (ref <200)
HDL Cholesterol: 57 MG/DL (ref 40–60)
HDL: 57 MG/DL (ref 40–60)
LDL Calculated: 66.8 MG/DL (ref 0–100)
LDL, calculated: 66.8 MG/DL (ref 0–100)
Triglyceride: 66 MG/DL (ref <150)
Triglycerides: 66 MG/DL (ref <150)
VLDL Cholesterol Calculated: 13.2 MG/DL
VLDL, calculated: 13.2 MG/DL

## 2018-03-19 LAB — COMPREHENSIVE METABOLIC PANEL
ALT: 24 U/L (ref 16–61)
AST: 16 U/L (ref 10–38)
Albumin/Globulin Ratio: 1.2 (ref 0.8–1.7)
Albumin: 3.6 g/dL (ref 3.4–5.0)
Alkaline Phosphatase: 63 U/L (ref 45–117)
Anion Gap: 2 mmol/L — ABNORMAL LOW (ref 3.0–18)
BUN: 25 MG/DL — ABNORMAL HIGH (ref 7.0–18)
Bun/Cre Ratio: 20 (ref 12–20)
CO2: 28 mmol/L (ref 21–32)
Calcium: 8.3 MG/DL — ABNORMAL LOW (ref 8.5–10.1)
Chloride: 111 mmol/L (ref 100–111)
Creatinine: 1.28 MG/DL (ref 0.6–1.3)
EGFR IF NonAfrican American: 55 mL/min/{1.73_m2} — ABNORMAL LOW (ref >60)
GFR African American: >60 mL/min/{1.73_m2} (ref >60)
Globulin: 2.9 g/dL (ref 2.0–4.0)
Glucose: 96 mg/dL (ref 74–99)
Potassium: 3.8 mmol/L (ref 3.5–5.5)
Sodium: 141 mmol/L (ref 136–145)
Total Bilirubin: 0.6 MG/DL (ref 0.2–1.0)
Total Protein: 6.5 g/dL (ref 6.4–8.2)

## 2018-03-25 ENCOUNTER — Ambulatory Visit
Admit: 2018-03-25 | Discharge: 2018-03-25 | Payer: PRIVATE HEALTH INSURANCE | Attending: Internal Medicine | Primary: Internal Medicine

## 2018-03-25 ENCOUNTER — Encounter: Attending: Cardiovascular Disease | Primary: Internal Medicine

## 2018-03-25 ENCOUNTER — Ambulatory Visit: Attending: Internal Medicine | Primary: Internal Medicine

## 2018-03-25 DIAGNOSIS — I2510 Atherosclerotic heart disease of native coronary artery without angina pectoris: Secondary | ICD-10-CM

## 2018-03-25 NOTE — Progress Notes (Signed)
Raymond [name], barriere [birth year], is a 73 y.o. male, who is seen today for   Reevaluation of atherosclerotic heart disease hyperlipidemia hypertension overweight.  He has noticed some lower extremity edema by the end of the day the last several weeks at least, the skin indents from his socks but it does not really bother him.  He takes all of his medicine correctly.  No chest pain or dyspnea.  Carotids  Past Medical History:   Diagnosis Date   ??? ASHD (arteriosclerotic heart disease)    ??? CABG x 3 02/2004   ??? Cardiac cath 02/29/2004    oRCA 100%.  LM patent.  p/mLAD 85%.  D1 90%.  D2 90%.  CX 45%.  LVEDP 12 mmHg.  EF 45%.  CABG recommended.   ??? Cardiac echocardiogram 11/08/2005    LVE.  EF 45-50%.  Inferior, inferolateral WMA.     ??? Cardiac nuclear imaging test, mod risk 04/11/2013    Intermediate risk.  Mild-mod basal inferior infarction w/mild-mod peri-infarct ischemia in mid inferior, prox & mid inferolateral walls.  EF unreadable due to gating error.  Neg EKG on max EST.  Ex time 6 min.     ??? Cardiovascular RLE venous duplex 08/05/2013    Right leg:  No DVT.   ??? Coronary artery disease     inferior wall myocardial infarction and stenting of the RCA 9/03; Status post CABG X3 in January 2006.   ??? Elevated prostate specific antigen (PSA)    ??? Hypercholesteremia     low HDL   ??? Hypertension    ??? Hypertension    ??? Hypertrophy of prostate with urinary obstruction and other lower urinary tract symptoms (LUTS)    ??? Microscopic hematuria    ??? Myocardial infarction, inferior wall ([place]) 10/12/01    acute   ??? Pleural effusion on right    ??? Right renal cyst    ??? Ventricular tachycardia, nonsustained (HCC)      Current Outpatient Medications   Medication Sig Dispense Refill   ??? carvedilol (COREG) 6.25 mg tablet TAKE 1 TABLET BY MOUTH TWICE DAILY WITH MEALS 180 Tab 0   ??? simvastatin (ZOCOR) 40 mg tablet TAKE 1 TABLET BY MOUTH EVERY NIGHT 90 Tab 3   ??? omega-3 acid ethyl esters (LOVAZA) 1 gram capsule TAKE 2 CAPSULES BY  MOUTH TWICE DAILY 180 Cap 3   ??? amLODIPine (NORVASC) 5 mg tablet TAKE 1 TABLET BY MOUTH DAILY 90 Tab 6   ??? losartan (COZAAR) 50 mg tablet TAKE 1 TABLET BY MOUTH TWICE DAILY 180 Tab 6   ??? omega-3 acid ethyl esters (LOVAZA) 1 gram capsule Take 1 Cap by mouth daily. 1 Cap 0   ??? triamcinolone (NASACORT AQ) 55 mcg nasal inhaler INSTILL 2 SPRAYS INTO EACH NOSTRIL DAILY 17 g 8   ??? aspirin 81 mg tablet Take 81 mg by mouth daily.     ??? MULTIVITAMIN PO Take 1 Tab by mouth daily.       Visit Vitals  BP 148/72 (BP 1 Location: Left arm, BP Patient Position: Sitting)   Pulse (!) 51   Temp 98.5 ??F (36.9 ??C) (Oral)   Resp 12   Ht 5' 9"  (1.753 m)   Wt 198 lb 6.4 oz (90 kg)   SpO2 97%   BMI 29.30 kg/m??     Carotids are 2+ without bruits.  Lungs are clear to percussion.  Good breath sounds with no wheezing or crackles.  Heart reveals a regular rhythm with  normal S1 and S2 no murmur gallop click or rub.  Apical impulse is not palpable.  Abdomen is soft and nontender with no hepatosplenomegaly or masses and no bruits.  Extremities reveal 1+ lower leg edema bilaterally.  Pulses are intact.  No clubbing or cyanosis.    Results for orders placed or performed during the hospital encounter of 03/18/18   LIPID PANEL   Result Value Ref Range    LIPID PROFILE          Cholesterol, total 137 <200 MG/DL    Triglyceride 66 <150 MG/DL    HDL Cholesterol 57 40 - 60 MG/DL    LDL, calculated 66.8 0 - 100 MG/DL    VLDL, calculated 13.2 MG/DL    CHOL/HDL Ratio 2.4 0 - 5.0     METABOLIC PANEL, COMPREHENSIVE   Result Value Ref Range    Sodium 141 136 - 145 mmol/L    Potassium 3.8 3.5 - 5.5 mmol/L    Chloride 111 100 - 111 mmol/L    CO2 28 21 - 32 mmol/L    Anion gap 2 (L) 3.0 - 18 mmol/L    Glucose 96 74 - 99 mg/dL    BUN 25 (H) 7.0 - 18 MG/DL    Creatinine 1.28 0.6 - 1.3 MG/DL    BUN/Creatinine ratio 20 12 - 20      GFR est AA >60 >60 ml/min/1.61m    GFR est non-AA 55 (L) >60 ml/min/1.764m   Calcium 8.3 (L) 8.5 - 10.1 MG/DL     Bilirubin, total 0.6 0.2 - 1.0 MG/DL    ALT (SGPT) 24 16 - 61 U/L    AST (SGOT) 16 10 - 38 U/L    Alk. phosphatase 63 45 - 117 U/L    Protein, total 6.5 6.4 - 8.2 g/dL    Albumin 3.6 3.4 - 5.0 g/dL    Globulin 2.9 2.0 - 4.0 g/dL    A-G Ratio 1.2 0.8 - 1.7       Assessment: #1.  Atherosclerotic heart disease asymptomatic, he will continue aspirin 81 mg daily and carvedilol 6.25 mg twice a day.  #2.  Hypertension blood pressure a lot higher than it usually runs, he has a follow-up appointment with his cardiologist in about 2 weeks and will keep that appointment, for now he will continue losartan 50 mg twice a day and amlodipine 5 mg daily.  #3.  He now sees edema probably related to amlodipine, he will elevate his legs when he is sitting and if the edema worsens significantly he will let me know.  #4.  Hyperlipidemia doing well, he will continue simvastatin 40 mg each evening.  #5.  Previous elevation of creatinine improved, that will be monitored.   #6.  Overweight little change, encouraged him to do the best he can to keep his weight down, cutting back on white starches and eating more vegetables.    Follow-up in mid July with lab.    Blessing Ozga R. MaCelesta AverMD FACP    Please note:  This document has been produced using voice recognition software. Unrecognized errors in transcription may be present.

## 2018-03-25 NOTE — Progress Notes (Signed)
Raymond [name], Raymond [name] Jun 18, 1945, is a 73 y.o. male, who is seen today for   Reevaluation of atherosclerotic heart disease hyperlipidemia hypertension overweight.  He has noticed some lower extremity edema by the end of the day the last several weeks at least, the skin indents from his socks but it does not really bother him.  He takes all of his medicine correctly.  No chest pain or dyspnea.  Carotids  Past Medical History:   Diagnosis Date   ??? ASHD (arteriosclerotic heart disease)    ??? CABG x 3 02/2004   ??? Cardiac cath 02/29/2004    oRCA 100%.  LM patent.  p/mLAD 85%.  D1 90%.  D2 90%.  CX 45%.  LVEDP 12 mmHg.  EF 45%.  CABG recommended.   ??? Cardiac echocardiogram 11/08/2005    LVE.  EF 45-50%.  Inferior, inferolateral WMA.     ??? Cardiac nuclear imaging test, mod risk 04/11/2013    Intermediate risk.  Mild-mod basal inferior infarction w/mild-mod peri-infarct ischemia in mid inferior, prox & mid inferolateral walls.  EF unreadable due to gating error.  Neg EKG on max EST.  Ex time 6 min.     ??? Cardiovascular RLE venous duplex 08/05/2013    Right leg:  No DVT.   ??? Coronary artery disease     inferior wall myocardial infarction and stenting of the RCA 9/03; Status post CABG X3 in January 2006.   ??? Elevated prostate specific antigen (PSA)    ??? Hypercholesteremia     low HDL   ??? Hypertension    ??? Hypertension    ??? Hypertrophy of prostate with urinary obstruction and other lower urinary tract symptoms (LUTS)    ??? Microscopic hematuria    ??? Myocardial infarction, inferior wall ([place]) 10/12/01    acute   ??? Pleural effusion on right    ??? Right renal cyst    ??? Ventricular tachycardia, nonsustained (HCC)      Current Outpatient Medications   Medication Sig Dispense Refill   ??? carvedilol (COREG) 6.25 mg tablet TAKE 1 TABLET BY MOUTH TWICE DAILY WITH MEALS 180 Tab 0   ??? simvastatin (ZOCOR) 40 mg tablet TAKE 1 TABLET BY MOUTH EVERY NIGHT 90 Tab 3   ??? omega-3 acid ethyl esters (LOVAZA) 1 gram capsule TAKE 2 CAPSULES BY MOUTH TWICE  DAILY 180 Cap 3   ??? amLODIPine (NORVASC) 5 mg tablet TAKE 1 TABLET BY MOUTH DAILY 90 Tab 6   ??? losartan (COZAAR) 50 mg tablet TAKE 1 TABLET BY MOUTH TWICE DAILY 180 Tab 6   ??? omega-3 acid ethyl esters (LOVAZA) 1 gram capsule Take 1 Cap by mouth daily. 1 Cap 0   ??? triamcinolone (NASACORT AQ) 55 mcg nasal inhaler INSTILL 2 SPRAYS INTO EACH NOSTRIL DAILY 17 g 8   ??? aspirin 81 mg tablet Take 81 mg by mouth daily.     ??? MULTIVITAMIN PO Take 1 Tab by mouth daily.       Visit Vitals  BP 148/72 (BP 1 Location: Left arm, BP Patient Position: Sitting)   Pulse (!) 51   Temp 98.5 ??F (36.9 ??C) (Oral)   Resp 12   Ht 5' 9"  (1.753 m)   Wt 198 lb 6.4 oz (90 kg)   SpO2 97%   BMI 29.30 kg/m??     Carotids are 2+ without bruits.  Lungs are clear to percussion.  Good breath sounds with no wheezing or crackles.  Heart reveals a regular rhythm with  normal S1 and S2 no murmur gallop click or rub.  Apical impulse is not palpable.  Abdomen is soft and nontender with no hepatosplenomegaly or masses and no bruits.  Extremities reveal 1+ lower leg edema bilaterally.  Pulses are intact.  No clubbing or cyanosis.    Results for orders placed or performed during the hospital encounter of 03/18/18   LIPID PANEL   Result Value Ref Range    LIPID PROFILE          Cholesterol, total 137 <200 MG/DL    Triglyceride 66 <150 MG/DL    HDL Cholesterol 57 40 - 60 MG/DL    LDL, calculated 66.8 0 - 100 MG/DL    VLDL, calculated 13.2 MG/DL    CHOL/HDL Ratio 2.4 0 - 5.0     METABOLIC PANEL, COMPREHENSIVE   Result Value Ref Range    Sodium 141 136 - 145 mmol/L    Potassium 3.8 3.5 - 5.5 mmol/L    Chloride 111 100 - 111 mmol/L    CO2 28 21 - 32 mmol/L    Anion gap 2 (L) 3.0 - 18 mmol/L    Glucose 96 74 - 99 mg/dL    BUN 25 (H) 7.0 - 18 MG/DL    Creatinine 1.28 0.6 - 1.3 MG/DL    BUN/Creatinine ratio 20 12 - 20      GFR est AA >60 >60 ml/min/1.10m    GFR est non-AA 55 (L) >60 ml/min/1.788m   Calcium 8.3 (L) 8.5 - 10.1 MG/DL    Bilirubin, total 0.6 0.2 - 1.0 MG/DL     ALT (SGPT) 24 16 - 61 U/L    AST (SGOT) 16 10 - 38 U/L    Alk. phosphatase 63 45 - 117 U/L    Protein, total 6.5 6.4 - 8.2 g/dL    Albumin 3.6 3.4 - 5.0 g/dL    Globulin 2.9 2.0 - 4.0 g/dL    A-G Ratio 1.2 0.8 - 1.7       Assessment: #1.  Atherosclerotic heart disease asymptomatic, he will continue aspirin 81 mg daily and carvedilol 6.25 mg twice a day.  #2.  Hypertension blood pressure a lot higher than it usually runs, he has a follow-up appointment with his cardiologist in about 2 weeks and will keep that appointment, for now he will continue losartan 50 mg twice a day and amlodipine 5 mg daily.  #3.  He now sees edema probably related to amlodipine, he will elevate his legs when he is sitting and if the edema worsens significantly he will let me know.  #4.  Hyperlipidemia doing well, he will continue simvastatin 40 mg each evening.  #5.  Previous elevation of creatinine improved, that will be monitored.   #6.  Overweight little change, encouraged him to do the best he can to keep his weight down, cutting back on white starches and eating more vegetables.    Follow-up in mid July with lab.    Sheilah Rayos R. MaCelesta AverMD FACP    Please note:  This document has been produced using voice recognition software. Unrecognized errors in transcription may be present.

## 2018-04-08 MED ORDER — LOSARTAN 50 MG TAB
50 mg | ORAL_TABLET | ORAL | 11 refills | Status: DC
Start: 2018-04-08 — End: 2019-04-04

## 2018-04-08 MED ORDER — AMLODIPINE 5 MG TAB
5 mg | ORAL_TABLET | ORAL | 6 refills | Status: DC
Start: 2018-04-08 — End: 2018-04-09

## 2018-04-09 ENCOUNTER — Ambulatory Visit: Admit: 2018-04-09 | Payer: PRIVATE HEALTH INSURANCE | Attending: Cardiovascular Disease | Primary: Internal Medicine

## 2018-04-09 ENCOUNTER — Ambulatory Visit: Attending: Cardiovascular Disease | Primary: Internal Medicine

## 2018-04-09 DIAGNOSIS — I2510 Atherosclerotic heart disease of native coronary artery without angina pectoris: Secondary | ICD-10-CM

## 2018-04-09 MED ORDER — TRIAMTERENE-HYDROCHLOROTHIAZIDE 37.5 MG-25 MG CAP
ORAL_CAPSULE | Freq: Every day | ORAL | 5 refills | Status: DC
Start: 2018-04-09 — End: 2018-09-30

## 2018-04-09 NOTE — Progress Notes (Signed)
Raymond [name] presents today for   Chief Complaint   Patient presents with   ??? Slow Heart Rate     6 MONTH F/U    ??? Leg Swelling     both legs for 6 months       Raymond [name] preferred language for health care discussion is english/other.    Is someone accompanying this pt? no    Is the patient using any DME equipment during OV? no    Depression Screening:  3 most recent PHQ Screens 03/25/2018   Little interest or pleasure in doing things Not at all   Feeling down, depressed, irritable, or hopeless Not at all   Total Score PHQ 2 0       Learning Assessment:  Learning Assessment 09/11/2013   PRIMARY LEARNER Patient   HIGHEST LEVEL OF EDUCATION - PRIMARY LEARNER  -   BARRIERS PRIMARY LEARNER -   CO-LEARNER CAREGIVER -   PRIMARY LANGUAGE ENGLISH   INTERPRETER NEED -   LEARNER PREFERENCE PRIMARY LISTENING   ANSWERED BY patient   RELATIONSHIP SELF       Abuse Screening:  Abuse Screening Questionnaire 09/17/2017   Do you ever feel afraid of your partner? N   Are you in a relationship with someone who physically or mentally threatens you? N   Is it safe for you to go home? Y       Fall Risk  Fall Risk Assessment, last 12 mths 03/25/2018   Able to walk? Yes   Fall in past 12 months? No       Pt currently taking Anticoagulant therapy? no    Coordination of Care:  1. Have you been to the ER, urgent care clinic since your last visit? Hospitalized since your last visit? no    2. Have you seen or consulted any other health care providers outside of the [facility] since your last visit? Include any pap smears or colon screening. no

## 2018-04-09 NOTE — Patient Instructions (Signed)
Stop amlodpine  Start traimterene-hydrocholorothiaizde 37.5/25mg  once dialy  Blood pressure appointment in 2 weeks  Blood work in 2 weeks  Follow up 45months dr Dory [place]

## 2018-04-09 NOTE — Progress Notes (Signed)
HISTORY OF PRESENT ILLNESS  NORMON RADFORD is a 73 y.o. male.  HPI  He has been feeling well. He denies cardiac symptoms. He has had no chest pain, dyspnea, orthopnea, PND.  He denies palpitations, dizziness or syncope. He has had no symptoms to indicate TIA or amaurosis fugax. When he was seen in August 2019, HCTZ and Aldactone were discontinued and he was started on amlodipine to improve the renal function. The creatinine did improve from 1.36 to 1.26 however he has developed leg edema and blood pressure is out of control.     He has a history of known coronary artery disease and had an acute inferior wall myocardial infarction on October 12, 2001, and had stenting of the right coronary artery at that time. In January 2006, he started experiencing frequent palpitations and dizziness with a feeling as if he was dropping down on a roller coaster. He was [facility] to [facility] on February 27, 2004 with an episode of the sinking feeling and nonsustained ventricular tachycardia. He subsequently underwent cardiac catheterization followed by CABG X3 on March 03, 2004, which consisted of:   1. LIMA to the LAD.   2. Sequential SVG to the diagonal and obtuse marginal branch.   His preoperative EF was in the 45% range. He was treated with amiodarone for nonsustained ventricular tachycardia and has had no recurrence of the sinking feeling or palpitations since the bypass surgery. He developed a large right pleural effusion which was once drained, but he has had some reaccumulation. Because of the sinking feeling, he had a 24-hour Holter monitor which failed to reveal a significant cardiac arrhythmia other than PVCs. He did have the sinking feeling while he was wearing the monitor, but it did not coincide with any significant ventricular arrhythmia.   He had the follow up stress nuclear cardiac imaging on 02/17/2010 which demonstrated fixed defect involving the inferior base with no significant  ischemia. Ejection fraction was estimated at 55%.  He underwent the follow up stress nuclear cardiac imaging on 04/11/13 which demonstrated a small to moderate sized fixed defect in the basal inferior and anterolateral wall with very mild reversibility in the inferolateral wall. Because of a gating error the ejection fraction was not calculated.  He reported that the Coreg has been reduced to 6.25 mg twice a day by [employer]. Jill Poling, MD because of the bradycardia.  He underwent a noninvasive carotid study in February of 2017, which demonstrated less than 50% stenosis in the right internal carotid artery and no significant stenosis of the left internal carotid artery.   He underwent stress nuclear cardiac imaging on 03/13/2016, which demonstrated a fixed defect in the mid and basal inferior wall with no ischemia. ??The ejection fraction was estimated at around 52%.????  Because of bradycardia he had a Holter monitor on 04/05/16 which demonstrated baseline sinus rhythm. Minimum heart rate was 41 beats per minute and average heart rate was 52 beats per minute with maximum heart rate of 94 beats per minute. There was no significant pauses. There was no significant atrial or ventricular arrhythmia. His cholesterol profile has been satisfactory.  ??  Review of Systems   Constitutional: Negative for malaise/fatigue and weight loss.   HENT: Negative for hearing loss.    Eyes: Negative for blurred vision and double vision.   Respiratory: Negative for shortness of breath.    Cardiovascular: Positive for leg swelling. Negative for chest pain, palpitations, orthopnea, claudication and PND.   Gastrointestinal: Negative for  blood in stool, heartburn and melena.   Genitourinary: Positive for frequency. Negative for dysuria, hematuria and urgency.   Musculoskeletal: Negative for back pain and joint pain.   Skin: Negative for itching and rash.   Neurological: Negative for dizziness and loss of consciousness.    Psychiatric/Behavioral: Negative for depression and memory loss.       Physical Exam  Constitutional:       Appearance: Normal appearance.   HENT:      Head: Normocephalic and atraumatic.   Eyes:      Pupils: Pupils are equal, round, and reactive to light.   Neck:      Musculoskeletal: Neck supple.      Vascular: No carotid bruit.   Cardiovascular:      Rate and Rhythm: Normal rate and regular rhythm.      Heart sounds: Normal heart sounds. No murmur. No friction rub. No gallop.    Pulmonary:      Effort: Pulmonary effort is normal.      Breath sounds: Normal breath sounds. No wheezing or rales.   Abdominal:      General: Abdomen is flat. There is no distension.      Palpations: Abdomen is soft.      Tenderness: There is no abdominal tenderness.   Musculoskeletal:         General: Swelling present.   Skin:     General: Skin is warm and dry.      Findings: No rash.   Neurological:      General: No focal deficit present.      Mental Status: He is alert and oriented to person, place, and time.   Psychiatric:         Mood and Affect: Mood normal.         Behavior: Behavior normal.         Thought Content: Thought content normal.          Visit Vitals  BP (!) 172/94   Pulse (!) 55   Ht  (1.753 m)   Wt 89.8 kg (198 lb)   SpO2 98%   BMI 29.24 kg/m??       Past Medical History:   Diagnosis Date   ??? ASHD (arteriosclerotic heart disease)    ??? CABG x 3 02/2004   ??? Cardiac cath 02/29/2004    oRCA 100%.  LM patent.  p/mLAD 85%.  D1 90%.  D2 90%.  CX 45%.  LVEDP 12 mmHg.  EF 45%.  CABG recommended.   ??? Cardiac echocardiogram 11/08/2005    LVE.  EF 45-50%.  Inferior, inferolateral WMA.     ??? Cardiac nuclear imaging test, mod risk 04/11/2013    Intermediate risk.  Mild-mod basal inferior infarction w/mild-mod peri-infarct ischemia in mid inferior, prox & mid inferolateral walls.  EF unreadable due to gating error.  Neg EKG on max EST.  Ex time 6 min.     ??? Cardiovascular RLE venous duplex 08/05/2013    Right leg:  No DVT.    ??? Coronary artery disease     inferior wall myocardial infarction and stenting of the RCA 9/03; Status post CABG X3 in January 2006.   ??? Elevated prostate specific antigen (PSA)    ??? Hypercholesteremia     low HDL   ??? Hypertension    ??? Hypertension    ??? Hypertrophy of prostate with urinary obstruction and other lower urinary tract symptoms (LUTS)    ??? Microscopic hematuria    ???  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 Pleural effusion on right    ??? Right renal cyst    ??? Ventricular tachycardia, nonsustained (HCC)        Social History     Socioeconomic History   ??? Marital status: MARRIED     Spouse name: Not on file   ??? Number of children: Not on file   ??? Years of education: Not on file   ??? Highest education level: Not on file   Occupational History   ??? Not on file   Social Needs   ??? Financial resource strain: Not on file   ??? Food insecurity:     Worry: Not on file     Inability: Not on file   ??? Transportation needs:     Medical: Not on file     Non-medical: Not on file   Tobacco Use   ??? Smoking status: Never Smoker   ??? Smokeless tobacco: Never Used   Substance and Sexual Activity   ??? Alcohol use: Yes     Comment: social   ??? Drug use: No   ??? Sexual activity: Not on file   Lifestyle   ??? Physical activity:     Days per week: Not on file     Minutes per session: Not on file   ??? Stress: Not on file   Relationships   ??? Social connections:     Talks on phone: Not on file     Gets together: Not on file     Attends religious service: Not on file     Active member of club or organization: Not on file     Attends meetings of clubs or organizations: Not on file     Relationship status: Not on file   ??? Intimate partner violence:     Fear of current or ex partner: Not on file     Emotionally abused: Not on file     Physically abused: Not on file     Forced sexual activity: Not on file   Other Topics Concern   ??? Not on file   Social History Narrative   ??? Not on file       Family History    Problem Relation Age of Onset   ??? Heart Disease Father         x3 heart bypass   ??? Arthritis-osteo Mother    ??? Hypertension Sister        Past Surgical History:   Procedure Laterality Date   ??? HX CORONARY ARTERY BYPASS GRAFT  03/03/04    LIMA to the LAD. Sequential SVG to the diagonal and obtuse marginal branch   ??? HX CORONARY STENT PLACEMENT  10/12/01    RCA stented using 3.5 x 13 mm Express 2 stent   ??? HX HEART CATHETERIZATION  03/03/04    followed by CABG X3   ??? HX TONSILLECTOMY     ??? STENT INSERTION  10/12/01    right coronary artery   ??? VASCULAR SURGERY PROCEDURE UNLIST      biopsy of mass in abdomen, benign       Current Outpatient Medications   Medication Sig Dispense Refill   ??? losartan (COZAAR) 50 mg tablet TAKE 1 TABLET BY MOUTH TWICE DAILY 60 Tab 11   ??? amLODIPine (NORVASC) 5 mg tablet TAKE 1 TABLET BY MOUTH DAILY 30 Tab 6   ??? carvedilol (COREG) 6.25 mg tablet TAKE 1  TABLET BY MOUTH TWICE DAILY WITH MEALS 180 Tab 0   ??? simvastatin (ZOCOR) 40 mg tablet TAKE 1 TABLET BY MOUTH EVERY NIGHT 90 Tab 3   ??? omega-3 acid ethyl esters (LOVAZA) 1 gram capsule Take 1 Cap by mouth daily. 1 Cap 0   ??? triamcinolone (NASACORT AQ) 55 mcg nasal inhaler INSTILL 2 SPRAYS INTO EACH NOSTRIL DAILY 17 g 8   ??? aspirin 81 mg tablet Take 81 mg by mouth daily.     ??? MULTIVITAMIN PO Take 1 Tab by mouth daily.     ??? omega-3 acid ethyl esters (LOVAZA) 1 gram capsule TAKE 2 CAPSULES BY MOUTH TWICE DAILY 180 Cap 3       EKG: unchanged from previous tracings, sinus bradycardia, RBBB, inf.scar  .  ASSESSMENT and PLAN  Encounter Diagnoses   Name Primary?   ??? Atherosclerosis of native coronary artery of native heart without angina pectoris,CABGx3 2006,EF 45% Yes   ??? Bradycardia    ??? Nonsustained ventricular tachycardia (HCC)    ??? Essential hypertension     ??? Hypercholesteremia    ??? Renal dysfunction    He has been doing well. He has had no symptoms to indicate angina or cardiac decompensation. However, he has developed mild leg edema  bilaterally from the calcium channel blockade and his blood pressure has become out of control although his renal function did improve mildly from a creatinine of 1.36 to 1.26 by discontinuing diuretics. At this time, I would start him back on diuretics by giving him dyazide one capsule daily and discontinue amlodipine with follow up blood pressure and BMP in one week.

## 2018-04-09 NOTE — Progress Notes (Signed)
HISTORY OF PRESENT ILLNESS  Raymond [name] is a 73 y.o. male.  HPI  He has been feeling well. He denies cardiac symptoms. He has had no chest pain, dyspnea, orthopnea, PND.  He denies palpitations, dizziness or syncope. He has had no symptoms to indicate TIA or amaurosis fugax. When he was seen in August 2019, HCTZ and Aldactone were discontinued and he was started on amlodipine to improve the renal function. The creatinine did improve from 1.36 to 1.26 however he has developed leg edema and blood pressure is out of control.     He has a history of known coronary artery disease and had an acute inferior wall myocardial infarction on October 12, 2001, and had stenting of the right coronary artery at that time. In January 2006, he started experiencing frequent palpitations and dizziness with a feeling as if he was dropping down on a roller coaster. He was admitted to [facility] on February 27, 2004 with an episode of the sinking feeling and nonsustained ventricular tachycardia. He subsequently underwent cardiac catheterization followed by CABG X3 on March 03, 2004, which consisted of:   1. LIMA to the LAD.   2. Sequential SVG to the diagonal and obtuse marginal branch.   His preoperative EF was in the 45% range. He was treated with amiodarone for nonsustained ventricular tachycardia and has had no recurrence of the sinking feeling or palpitations since the bypass surgery. He developed a large right pleural effusion which was once drained, but he has had some reaccumulation. Because of the sinking feeling, he had a 24-hour Holter monitor which failed to reveal a significant cardiac arrhythmia other than PVCs. He did have the sinking feeling while he was wearing the monitor, but it did not coincide with any significant ventricular arrhythmia.   He had the follow up stress nuclear cardiac imaging on 02/17/2010 which demonstrated fixed defect involving the inferior base with no significant ischemia. Ejection  fraction was estimated at 55%.  He underwent the follow up stress nuclear cardiac imaging on 04/11/13 which demonstrated a small to moderate sized fixed defect in the basal inferior and anterolateral wall with very mild reversibility in the inferolateral wall. Because of a gating error the ejection fraction was not calculated.  He reported that the Coreg has been reduced to 6.25 mg twice a day by [employer]. Jill Poling, MD because of the bradycardia.  He underwent a noninvasive carotid study in February of 2017, which demonstrated less than 50% stenosis in the right internal carotid artery and no significant stenosis of the left internal carotid artery.   He underwent stress nuclear cardiac imaging on 03/13/2016, which demonstrated a fixed defect in the mid and basal inferior wall with no ischemia. ??The ejection fraction was estimated at around 52%.????  Because of bradycardia he had a Holter monitor on 04/05/16 which demonstrated baseline sinus rhythm. Minimum heart rate was 41 beats per minute and average heart rate was 52 beats per minute with maximum heart rate of 94 beats per minute. There was no significant pauses. There was no significant atrial or ventricular arrhythmia. His cholesterol profile has been satisfactory.  ??  Review of Systems   Constitutional: Negative for malaise/fatigue and weight loss.   HENT: Negative for hearing loss.    Eyes: Negative for blurred vision and double vision.   Respiratory: Negative for shortness of breath.    Cardiovascular: Positive for leg swelling. Negative for chest pain, palpitations, orthopnea, claudication and PND.   Gastrointestinal: Negative for  blood in stool, heartburn and melena.   Genitourinary: Positive for frequency. Negative for dysuria, hematuria and urgency.   Musculoskeletal: Negative for back pain and joint pain.   Skin: Negative for itching and rash.   Neurological: Negative for dizziness and loss of consciousness.   Psychiatric/Behavioral: Negative for  depression and memory loss.       Physical Exam  Constitutional:       Appearance: Normal appearance.   HENT:      Head: Normocephalic and atraumatic.   Eyes:      Pupils: Pupils are equal, round, and reactive to light.   Neck:      Musculoskeletal: Neck supple.      Vascular: No carotid bruit.   Cardiovascular:      Rate and Rhythm: Normal rate and regular rhythm.      Heart sounds: Normal heart sounds. No murmur. No friction rub. No gallop.    Pulmonary:      Effort: Pulmonary effort is normal.      Breath sounds: Normal breath sounds. No wheezing or rales.   Abdominal:      General: Abdomen is flat. There is no distension.      Palpations: Abdomen is soft.      Tenderness: There is no abdominal tenderness.   Musculoskeletal:         General: Swelling present.   Skin:     General: Skin is warm and dry.      Findings: No rash.   Neurological:      General: No focal deficit present.      Mental Status: He is alert and oriented to person, place, and time.   Psychiatric:         Mood and Affect: Mood normal.         Behavior: Behavior normal.         Thought Content: Thought content normal.          Visit Vitals  BP (!) 172/94   Pulse (!) 55   Ht 5\' 9"  (1.753 m)   Wt 89.8 kg (198 lb)   SpO2 98%   BMI 29.24 kg/m??       Past Medical History:   Diagnosis Date   ??? ASHD (arteriosclerotic heart disease)    ??? CABG x 3 02/2004   ??? Cardiac cath 02/29/2004    oRCA 100%.  LM patent.  p/mLAD 85%.  D1 90%.  D2 90%.  CX 45%.  LVEDP 12 mmHg.  EF 45%.  CABG recommended.   ??? Cardiac echocardiogram 11/08/2005    LVE.  EF 45-50%.  Inferior, inferolateral WMA.     ??? Cardiac nuclear imaging test, mod risk 04/11/2013    Intermediate risk.  Mild-mod basal inferior infarction w/mild-mod peri-infarct ischemia in mid inferior, prox & mid inferolateral walls.  EF unreadable due to gating error.  Neg EKG on max EST.  Ex time 6 min.     ??? Cardiovascular RLE venous duplex 08/05/2013    Right leg:  No DVT.   ??? Coronary artery disease     inferior  wall myocardial infarction and stenting of the RCA 9/03; Status post CABG X3 in January 2006.   ??? Elevated prostate specific antigen (PSA)    ??? Hypercholesteremia     low HDL   ??? Hypertension    ??? Hypertension    ??? Hypertrophy of prostate with urinary obstruction and other lower urinary tract symptoms (LUTS)    ??? Microscopic hematuria    ???  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 Pleural effusion on right    ??? Right renal cyst    ??? Ventricular tachycardia, nonsustained (HCC)        Social History     Socioeconomic History   ??? Marital status: MARRIED     Spouse name: Not on file   ??? Number of children: Not on file   ??? Years of education: Not on file   ??? Highest education level: Not on file   Occupational History   ??? Not on file   Social Needs   ??? Financial resource strain: Not on file   ??? Food insecurity:     Worry: Not on file     Inability: Not on file   ??? Transportation needs:     Medical: Not on file     Non-medical: Not on file   Tobacco Use   ??? Smoking status: Never Smoker   ??? Smokeless tobacco: Never Used   Substance and Sexual Activity   ??? Alcohol use: Yes     Comment: social   ??? Drug use: No   ??? Sexual activity: Not on file   Lifestyle   ??? Physical activity:     Days per week: Not on file     Minutes per session: Not on file   ??? Stress: Not on file   Relationships   ??? Social connections:     Talks on phone: Not on file     Gets together: Not on file     Attends religious service: Not on file     Active member of club or organization: Not on file     Attends meetings of clubs or organizations: Not on file     Relationship status: Not on file   ??? Intimate partner violence:     Fear of current or ex partner: Not on file     Emotionally abused: Not on file     Physically abused: Not on file     Forced sexual activity: Not on file   Other Topics Concern   ??? Not on file   Social History Narrative   ??? Not on file       Family History   Problem Relation Age of Onset   ??? Heart Disease Father          x3 heart bypass   ??? Arthritis-osteo Mother    ??? Hypertension Sister        Past Surgical History:   Procedure Laterality Date   ??? HX CORONARY ARTERY BYPASS GRAFT  03/03/04    LIMA to the LAD. Sequential SVG to the diagonal and obtuse marginal branch   ??? HX CORONARY STENT PLACEMENT  10/12/01    RCA stented using 3.5 x 13 mm Express 2 stent   ??? HX HEART CATHETERIZATION  03/03/04    followed by CABG X3   ??? HX TONSILLECTOMY     ??? STENT INSERTION  10/12/01    right coronary artery   ??? VASCULAR SURGERY PROCEDURE UNLIST      biopsy of mass in abdomen, benign       Current Outpatient Medications   Medication Sig Dispense Refill   ??? losartan (COZAAR) 50 mg tablet TAKE 1 TABLET BY MOUTH TWICE DAILY 60 Tab 11   ??? amLODIPine (NORVASC) 5 mg tablet TAKE 1 TABLET BY MOUTH DAILY 30 Tab 6   ??? carvedilol (COREG) 6.25 mg tablet TAKE 1  TABLET BY MOUTH TWICE DAILY WITH MEALS 180 Tab 0   ??? simvastatin (ZOCOR) 40 mg tablet TAKE 1 TABLET BY MOUTH EVERY NIGHT 90 Tab 3   ??? omega-3 acid ethyl esters (LOVAZA) 1 gram capsule Take 1 Cap by mouth daily. 1 Cap 0   ??? triamcinolone (NASACORT AQ) 55 mcg nasal inhaler INSTILL 2 SPRAYS INTO EACH NOSTRIL DAILY 17 g 8   ??? aspirin 81 mg tablet Take 81 mg by mouth daily.     ??? MULTIVITAMIN PO Take 1 Tab by mouth daily.     ??? omega-3 acid ethyl esters (LOVAZA) 1 gram capsule TAKE 2 CAPSULES BY MOUTH TWICE DAILY 180 Cap 3       EKG: unchanged from previous tracings, sinus bradycardia, RBBB, inf.scar  .  ASSESSMENT and PLAN  Encounter Diagnoses   Name Primary?   ??? Atherosclerosis of native coronary artery of native heart without angina pectoris,CABGx3 2006,EF 45% Yes   ??? Bradycardia    ??? Nonsustained ventricular tachycardia (HCC)    ??? Essential hypertension     ??? Hypercholesteremia    ??? Renal dysfunction    He has been doing well. He has had no symptoms to indicate angina or cardiac decompensation. However, he has developed mild leg edema bilaterally from the calcium channel blockade and his blood pressure has  become out of control although his renal function did improve mildly from a creatinine of 1.36 to 1.26 by discontinuing diuretics. At this time, I would start him back on diuretics by giving him dyazide one capsule daily and discontinue amlodipine with follow up blood pressure and BMP in one week.

## 2018-04-09 NOTE — Progress Notes (Signed)
Raymond [name] presents today for   Chief Complaint   Patient presents with   . Slow Heart Rate     6 MONTH F/U    . Leg Swelling     both legs for 6 months       Raymond [name] preferred language for health care discussion is english/other.    Is someone accompanying this pt? no    Is the patient using any DME equipment during OV? no    Depression Screening:  3 most recent PHQ Screens 03/25/2018   Little interest or pleasure in doing things Not at all   Feeling down, depressed, irritable, or hopeless Not at all   Total Score PHQ 2 0       Learning Assessment:  Learning Assessment 09/11/2013   PRIMARY LEARNER Patient   HIGHEST LEVEL OF EDUCATION - PRIMARY LEARNER  -   BARRIERS PRIMARY LEARNER -   CO-LEARNER CAREGIVER -   PRIMARY LANGUAGE ENGLISH   INTERPRETER NEED -   LEARNER PREFERENCE PRIMARY LISTENING   ANSWERED BY patient   RELATIONSHIP SELF       Abuse Screening:  Abuse Screening Questionnaire 09/17/2017   Do you ever feel afraid of your partner? N   Are you in a relationship with someone who physically or mentally threatens you? N   Is it safe for you to go home? Y       Fall Risk  Fall Risk Assessment, last 12 mths 03/25/2018   Able to walk? Yes   Fall in past 12 months? No       Pt currently taking Anticoagulant therapy? no    Coordination of Care:  1. Have you been to the ER, urgent care clinic since your last visit? Hospitalized since your last visit? no    2. Have you seen or consulted any other health care providers outside of the [facility] since your last visit? Include any pap smears or colon screening. no

## 2018-04-19 MED ORDER — CARVEDILOL 6.25 MG TAB
6.25 mg | ORAL_TABLET | ORAL | 0 refills | Status: DC
Start: 2018-04-19 — End: 2018-07-15

## 2018-04-19 NOTE — Telephone Encounter (Signed)
Last fill 01/14/2018    Last ov   03/25/18

## 2018-04-24 ENCOUNTER — Inpatient Hospital Stay: Admit: 2018-04-24 | Payer: BLUE CROSS/BLUE SHIELD | Primary: Internal Medicine

## 2018-04-24 DIAGNOSIS — I10 Essential (primary) hypertension: Secondary | ICD-10-CM

## 2018-04-24 LAB — METABOLIC PANEL, BASIC
Anion gap: 3 mmol/L (ref 3.0–18)
BUN/Creatinine ratio: 15 (ref 12–20)
BUN: 24 MG/DL — ABNORMAL HIGH (ref 7.0–18)
CO2: 30 mmol/L (ref 21–32)
Calcium: 9.1 MG/DL (ref 8.5–10.1)
Chloride: 108 mmol/L (ref 100–111)
Creatinine: 1.61 MG/DL — ABNORMAL HIGH (ref 0.6–1.3)
GFR est AA: 51 mL/min/{1.73_m2} — ABNORMAL LOW (ref >60)
GFR est non-AA: 42 mL/min/{1.73_m2} — ABNORMAL LOW (ref >60)
Glucose: 82 mg/dL (ref 74–99)
Potassium: 4.3 mmol/L (ref 3.5–5.5)
Sodium: 141 mmol/L (ref 136–145)

## 2018-04-24 LAB — BASIC METABOLIC PANEL
Anion Gap: 3 mmol/L (ref 3.0–18)
BUN: 24 MG/DL — ABNORMAL HIGH (ref 7.0–18)
Bun/Cre Ratio: 15 (ref 12–20)
CO2: 30 mmol/L (ref 21–32)
Calcium: 9.1 MG/DL (ref 8.5–10.1)
Chloride: 108 mmol/L (ref 100–111)
Creatinine: 1.61 MG/DL — ABNORMAL HIGH (ref 0.6–1.3)
EGFR IF NonAfrican American: 42 mL/min/{1.73_m2} — ABNORMAL LOW (ref >60)
GFR African American: 51 mL/min/{1.73_m2} — ABNORMAL LOW (ref >60)
Glucose: 82 mg/dL (ref 74–99)
Potassium: 4.3 mmol/L (ref 3.5–5.5)
Sodium: 141 mmol/L (ref 136–145)

## 2018-04-24 NOTE — Progress Notes (Signed)
Unchanged,let him know.

## 2018-04-24 NOTE — Progress Notes (Signed)
Unchanged,let him know.

## 2018-04-26 NOTE — Telephone Encounter (Signed)
Patient had a 2 week blood pressure check sched Monday 04/29/18... pt called wanted to cancel the appointment nervous about COVID-19... patient said he will check BP at home and call us with reading on 3/23.  Note forwarded to Dr Sedonia Small nurse [place]. Pasty Spillers

## 2018-04-29 ENCOUNTER — Encounter: Primary: Internal Medicine

## 2018-04-29 NOTE — Telephone Encounter (Signed)
Patient called in to report his BP reading. He took it 2 hours after taking his medication and it was 139/71.

## 2018-05-10 NOTE — Telephone Encounter (Signed)
Called patient about labs. Verified name and DOB. Patient made aware who indicated understanding.

## 2018-05-10 NOTE — Telephone Encounter (Signed)
-----   Message from Edrick Oh, MD sent at 05/06/2018  9:50 AM EDT -----  Warren Danes him know.

## 2018-07-15 MED ORDER — CARVEDILOL 6.25 MG TAB
6.25 mg | ORAL_TABLET | ORAL | 1 refills | Status: DC
Start: 2018-07-15 — End: 2019-01-13

## 2018-08-08 ENCOUNTER — Encounter: Admit: 2018-08-08 | Discharge: 2018-08-08 | Payer: PRIVATE HEALTH INSURANCE | Primary: Internal Medicine

## 2018-08-08 ENCOUNTER — Inpatient Hospital Stay: Admit: 2018-08-08 | Payer: BLUE CROSS/BLUE SHIELD | Primary: Internal Medicine

## 2018-08-08 DIAGNOSIS — E785 Hyperlipidemia, unspecified: Secondary | ICD-10-CM

## 2018-08-08 LAB — METABOLIC PANEL, COMPREHENSIVE
A-G Ratio: 1.2 (ref 0.8–1.7)
ALT (SGPT): 28 U/L (ref 16–61)
AST (SGOT): 15 U/L (ref 10–38)
Albumin: 3.7 g/dL (ref 3.4–5.0)
Alk. phosphatase: 72 U/L (ref 45–117)
Anion gap: 6 mmol/L (ref 3.0–18)
BUN/Creatinine ratio: 14 (ref 12–20)
BUN: 27 MG/DL — ABNORMAL HIGH (ref 7.0–18)
Bilirubin, total: 0.6 MG/DL (ref 0.2–1.0)
CO2: 30 mmol/L (ref 21–32)
Calcium: 9.2 MG/DL (ref 8.5–10.1)
Chloride: 106 mmol/L (ref 100–111)
Creatinine: 2 MG/DL — ABNORMAL HIGH (ref 0.6–1.3)
GFR est AA: 40 mL/min/{1.73_m2} — ABNORMAL LOW (ref >60)
GFR est non-AA: 33 mL/min/{1.73_m2} — ABNORMAL LOW (ref >60)
Globulin: 3 g/dL (ref 2.0–4.0)
Glucose: 107 mg/dL — ABNORMAL HIGH (ref 74–99)
Potassium: 3.7 mmol/L (ref 3.5–5.5)
Protein, total: 6.7 g/dL (ref 6.4–8.2)
Sodium: 142 mmol/L (ref 136–145)

## 2018-08-08 LAB — LIPID PANEL
CHOL/HDL Ratio: 2.5 (ref 0–5.0)
Chol/HDL Ratio: 2.5 (ref 0–5.0)
Cholesterol, Total: 134 MG/DL (ref <200)
Cholesterol, total: 134 MG/DL (ref <200)
HDL Cholesterol: 53 MG/DL (ref 40–60)
HDL: 53 MG/DL (ref 40–60)
LDL Calculated: 65 MG/DL (ref 0–100)
LDL, calculated: 65 MG/DL (ref 0–100)
Triglyceride: 80 MG/DL (ref <150)
Triglycerides: 80 MG/DL (ref <150)
VLDL Cholesterol Calculated: 16 MG/DL
VLDL, calculated: 16 MG/DL

## 2018-08-08 LAB — COMPREHENSIVE METABOLIC PANEL
ALT: 28 U/L (ref 16–61)
AST: 15 U/L (ref 10–38)
Albumin/Globulin Ratio: 1.2 (ref 0.8–1.7)
Albumin: 3.7 g/dL (ref 3.4–5.0)
Alkaline Phosphatase: 72 U/L (ref 45–117)
Anion Gap: 6 mmol/L (ref 3.0–18)
BUN: 27 MG/DL — ABNORMAL HIGH (ref 7.0–18)
Bun/Cre Ratio: 14 (ref 12–20)
CO2: 30 mmol/L (ref 21–32)
Calcium: 9.2 MG/DL (ref 8.5–10.1)
Chloride: 106 mmol/L (ref 100–111)
Creatinine: 2 MG/DL — ABNORMAL HIGH (ref 0.6–1.3)
EGFR IF NonAfrican American: 33 mL/min/{1.73_m2} — ABNORMAL LOW (ref >60)
GFR African American: 40 mL/min/{1.73_m2} — ABNORMAL LOW (ref >60)
Globulin: 3 g/dL (ref 2.0–4.0)
Glucose: 107 mg/dL — ABNORMAL HIGH (ref 74–99)
Potassium: 3.7 mmol/L (ref 3.5–5.5)
Sodium: 142 mmol/L (ref 136–145)
Total Bilirubin: 0.6 MG/DL (ref 0.2–1.0)
Total Protein: 6.7 g/dL (ref 6.4–8.2)

## 2018-08-20 ENCOUNTER — Ambulatory Visit
Admit: 2018-08-20 | Discharge: 2018-08-20 | Payer: PRIVATE HEALTH INSURANCE | Attending: Internal Medicine | Primary: Internal Medicine

## 2018-08-20 ENCOUNTER — Inpatient Hospital Stay: Admit: 2018-08-20 | Payer: BLUE CROSS/BLUE SHIELD | Primary: Internal Medicine

## 2018-08-20 ENCOUNTER — Ambulatory Visit: Attending: Internal Medicine | Primary: Internal Medicine

## 2018-08-20 DIAGNOSIS — I10 Essential (primary) hypertension: Secondary | ICD-10-CM

## 2018-08-20 DIAGNOSIS — E785 Hyperlipidemia, unspecified: Secondary | ICD-10-CM

## 2018-08-20 LAB — METABOLIC PANEL, COMPREHENSIVE
A-G Ratio: 1.2 (ref 0.8–1.7)
ALT (SGPT): 27 U/L (ref 16–61)
AST (SGOT): 16 U/L (ref 10–38)
Albumin: 3.7 g/dL (ref 3.4–5.0)
Alk. phosphatase: 69 U/L (ref 45–117)
Anion gap: 7 mmol/L (ref 3.0–18)
BUN/Creatinine ratio: 14 (ref 12–20)
BUN: 23 MG/DL — ABNORMAL HIGH (ref 7.0–18)
Bilirubin, total: 0.5 MG/DL (ref 0.2–1.0)
CO2: 29 mmol/L (ref 21–32)
Calcium: 9.2 MG/DL (ref 8.5–10.1)
Chloride: 105 mmol/L (ref 100–111)
Creatinine: 1.67 MG/DL — ABNORMAL HIGH (ref 0.6–1.3)
GFR est AA: 49 mL/min/{1.73_m2} — ABNORMAL LOW (ref >60)
GFR est non-AA: 40 mL/min/{1.73_m2} — ABNORMAL LOW (ref >60)
Globulin: 3.2 g/dL (ref 2.0–4.0)
Glucose: 93 mg/dL (ref 74–99)
Potassium: 4.1 mmol/L (ref 3.5–5.5)
Protein, total: 6.9 g/dL (ref 6.4–8.2)
Sodium: 141 mmol/L (ref 136–145)

## 2018-08-20 LAB — LIPID PANEL
CHOL/HDL Ratio: 2.6 (ref 0–5.0)
Chol/HDL Ratio: 2.6 (ref 0–5.0)
Cholesterol, Total: 151 MG/DL (ref <200)
Cholesterol, total: 151 MG/DL (ref <200)
HDL Cholesterol: 58 MG/DL (ref 40–60)
HDL: 58 MG/DL (ref 40–60)
LDL Calculated: 74 MG/DL (ref 0–100)
LDL, calculated: 74 MG/DL (ref 0–100)
Triglyceride: 95 MG/DL (ref <150)
Triglycerides: 95 MG/DL (ref <150)
VLDL Cholesterol Calculated: 19 MG/DL
VLDL, calculated: 19 MG/DL

## 2018-08-20 LAB — COMPREHENSIVE METABOLIC PANEL
ALT: 27 U/L (ref 16–61)
AST: 16 U/L (ref 10–38)
Albumin/Globulin Ratio: 1.2 (ref 0.8–1.7)
Albumin: 3.7 g/dL (ref 3.4–5.0)
Alkaline Phosphatase: 69 U/L (ref 45–117)
Anion Gap: 7 mmol/L (ref 3.0–18)
BUN: 23 MG/DL — ABNORMAL HIGH (ref 7.0–18)
Bun/Cre Ratio: 14 (ref 12–20)
CO2: 29 mmol/L (ref 21–32)
Calcium: 9.2 MG/DL (ref 8.5–10.1)
Chloride: 105 mmol/L (ref 100–111)
Creatinine: 1.67 MG/DL — ABNORMAL HIGH (ref 0.6–1.3)
EGFR IF NonAfrican American: 40 mL/min/{1.73_m2} — ABNORMAL LOW (ref >60)
GFR African American: 49 mL/min/{1.73_m2} — ABNORMAL LOW (ref >60)
Globulin: 3.2 g/dL (ref 2.0–4.0)
Glucose: 93 mg/dL (ref 74–99)
Potassium: 4.1 mmol/L (ref 3.5–5.5)
Sodium: 141 mmol/L (ref 136–145)
Total Bilirubin: 0.5 MG/DL (ref 0.2–1.0)
Total Protein: 6.9 g/dL (ref 6.4–8.2)

## 2018-08-20 NOTE — Progress Notes (Signed)
Raymond [name], Raymond [name] 03/30/1945, is a 73 y.o. male, who is seen today for reevaluation of atherosclerotic heart disease, hypertension, chronic renal failure and dyslipidemia.  He feels well and is having no chest pain.  He is following his diet and taking all of his medicine correctly.    Past Medical History:   Diagnosis Date   ??? ASHD (arteriosclerotic heart disease)    ??? CABG x 3 02/2004   ??? Cardiac cath 02/29/2004    oRCA 100%.  LM patent.  p/mLAD 85%.  D1 90%.  D2 90%.  CX 45%.  LVEDP 12 mmHg.  EF 45%.  CABG recommended.   ??? Cardiac echocardiogram 11/08/2005    LVE.  EF 45-50%.  Inferior, inferolateral WMA.     ??? Cardiac nuclear imaging test, mod risk 04/11/2013    Intermediate risk.  Mild-mod basal inferior infarction w/mild-mod peri-infarct ischemia in mid inferior, prox & mid inferolateral walls.  EF unreadable due to gating error.  Neg EKG on max EST.  Ex time 6 min.     ??? Cardiovascular RLE venous duplex 08/05/2013    Right leg:  No DVT.   ??? Coronary artery disease     inferior wall myocardial infarction and stenting of the RCA 9/03; Status post CABG X3 in January 2006.   ??? Elevated prostate specific antigen (PSA)    ??? Hypercholesteremia     low HDL   ??? Hypertension    ??? Hypertension    ??? Hypertrophy of prostate with urinary obstruction and other lower urinary tract symptoms (LUTS)    ??? Microscopic hematuria    ??? Myocardial infarction, inferior wall ([place]) 10/12/01    acute   ??? Pleural effusion on right    ??? Right renal cyst    ??? Ventricular tachycardia, nonsustained (HCC)      Current Outpatient Medications   Medication Sig Dispense Refill   ??? carvediloL (COREG) 6.25 mg tablet TAKE 1 TABLET BY MOUTH TWICE DAILY WITH MEALS 180 Tab 1   ??? triamterene-hydroCHLOROthiazide (DYAZIDE) 37.5-25 mg per capsule Take 1 Cap by mouth daily. 30 Cap 5   ??? losartan (COZAAR) 50 mg tablet TAKE 1 TABLET BY MOUTH TWICE DAILY 60 Tab 11   ??? simvastatin (ZOCOR) 40 mg tablet TAKE 1 TABLET BY MOUTH EVERY NIGHT 90 Tab 3    ??? omega-3 acid ethyl esters (LOVAZA) 1 gram capsule TAKE 2 CAPSULES BY MOUTH TWICE DAILY 180 Cap 3   ??? omega-3 acid ethyl esters (LOVAZA) 1 gram capsule Take 1 Cap by mouth daily. 1 Cap 0   ??? triamcinolone (NASACORT AQ) 55 mcg nasal inhaler INSTILL 2 SPRAYS INTO EACH NOSTRIL DAILY 17 g 8   ??? aspirin 81 mg tablet Take 81 mg by mouth daily.     ??? MULTIVITAMIN PO Take 1 Tab by mouth daily.       Visit Vitals  BP 122/72   Pulse 68   Temp 97.2 ??F (36.2 ??C) (Temporal)   Resp 14   Ht 5' 9"  (1.753 m)   Wt 194 lb (88 kg)   BMI 28.65 kg/m??     Carotids are 2+ without bruits.  Lungs are clear to percussion.  Good breath sounds with no wheezing or crackles.  Heart reveals a regular rhythm with normal S1 and S2 no murmur gallop click or rub.  Apical impulse is not palpable.  Abdomen is soft and nontender with no hepatosplenomegaly or masses and no bruits.  Extremities reveal no clubbing cyanosis or edema.  Pulses are 2+.    Results for orders placed or performed during the hospital encounter of 08/08/18   LIPID PANEL   Result Value Ref Range    LIPID PROFILE          Cholesterol, total 134 <200 MG/DL    Triglyceride 80 <150 MG/DL    HDL Cholesterol 53 40 - 60 MG/DL    LDL, calculated 65 0 - 100 MG/DL    VLDL, calculated 16 MG/DL    CHOL/HDL Ratio 2.5 0 - 5.0     METABOLIC PANEL, COMPREHENSIVE   Result Value Ref Range    Sodium 142 136 - 145 mmol/L    Potassium 3.7 3.5 - 5.5 mmol/L    Chloride 106 100 - 111 mmol/L    CO2 30 21 - 32 mmol/L    Anion gap 6 3.0 - 18 mmol/L    Glucose 107 (H) 74 - 99 mg/dL    BUN 27 (H) 7.0 - 18 MG/DL    Creatinine 2.00 (H) 0.6 - 1.3 MG/DL    BUN/Creatinine ratio 14 12 - 20      GFR est AA 40 (L) >60 ml/min/1.19m    GFR est non-AA 33 (L) >60 ml/min/1.755m   Calcium 9.2 8.5 - 10.1 MG/DL    Bilirubin, total 0.6 0.2 - 1.0 MG/DL    ALT (SGPT) 28 16 - 61 U/L    AST (SGOT) 15 10 - 38 U/L    Alk. phosphatase 72 45 - 117 U/L    Protein, total 6.7 6.4 - 8.2 g/dL    Albumin 3.7 3.4 - 5.0 g/dL     Globulin 3.0 2.0 - 4.0 g/dL    A-G Ratio 1.2 0.8 - 1.7       Assessment: #1.  ASHD asymptomatic, he will continue aspirin and carvedilol.  #2.  Chronic renal failure with creatinine much higher than usual, we will check BMP again today and if it is still showing high creatinine probably refer to nephrology.  #3.  Hyperlipidemia doing well, he will continue simvastatin 40 mg each evening.  #4.  Hypertension well controlled, he will continue losartan and Dyazide.    Follow-up with lab in 6 months with Dr. DuKy Barban   Maxene Byington R. MaCelesta AverMD FACP    Please note:  This document has been produced using voice recognition software. Unrecognized errors in transcription may be present.

## 2018-08-20 NOTE — Telephone Encounter (Signed)
Ok to take

## 2018-08-20 NOTE — Telephone Encounter (Signed)
Dr. MacKinnon is requesting patient see you in 6 months. Please advise if this is ok.

## 2018-08-20 NOTE — Telephone Encounter (Signed)
appt scheduled

## 2018-08-20 NOTE — Progress Notes (Signed)
Please notify the patient that the labs shows his kidney function to be about the same as it was 3 months ago, much better than it was by the measurement done 2 weeks ago, that was probably a lab error.

## 2018-08-20 NOTE — Progress Notes (Signed)
lum, stillinger August 14, 1945, is a 73 y.o. male, who is seen today for reevaluation of atherosclerotic heart disease, hypertension, chronic renal failure and dyslipidemia.  He feels well and is having no chest pain.  He is following his diet and taking all of his medicine correctly.    Past Medical History:   Diagnosis Date   ??? ASHD (arteriosclerotic heart disease)    ??? CABG x 3 02/2004   ??? Cardiac cath 02/29/2004    oRCA 100%.  LM patent.  p/mLAD 85%.  D1 90%.  D2 90%.  CX 45%.  LVEDP 12 mmHg.  EF 45%.  CABG recommended.   ??? Cardiac echocardiogram 11/08/2005    LVE.  EF 45-50%.  Inferior, inferolateral WMA.     ??? Cardiac nuclear imaging test, mod risk 04/11/2013    Intermediate risk.  Mild-mod basal inferior infarction w/mild-mod peri-infarct ischemia in mid inferior, prox & mid inferolateral walls.  EF unreadable due to gating error.  Neg EKG on max EST.  Ex time 6 min.     ??? Cardiovascular RLE venous duplex 08/05/2013    Right leg:  No DVT.   ??? Coronary artery disease     inferior wall myocardial infarction and stenting of the RCA 9/03; Status post CABG X3 in January 2006.   ??? Elevated prostate specific antigen (PSA)    ??? Hypercholesteremia     low HDL   ??? Hypertension    ??? Hypertension    ??? Hypertrophy of prostate with urinary obstruction and other lower urinary tract symptoms (LUTS)    ??? Microscopic hematuria    ??? Myocardial infarction, inferior wall ([place]) 10/12/01    acute   ??? Pleural effusion on right    ??? Right renal cyst    ??? Ventricular tachycardia, nonsustained (HCC)      Current Outpatient Medications   Medication Sig Dispense Refill   ??? carvediloL (COREG) 6.25 mg tablet TAKE 1 TABLET BY MOUTH TWICE DAILY WITH MEALS 180 Tab 1   ??? triamterene-hydroCHLOROthiazide (DYAZIDE) 37.5-25 mg per capsule Take 1 Cap by mouth daily. 30 Cap 5   ??? losartan (COZAAR) 50 mg tablet TAKE 1 TABLET BY MOUTH TWICE DAILY 60 Tab 11   ??? simvastatin (ZOCOR) 40 mg tablet TAKE 1 TABLET BY MOUTH EVERY NIGHT 90 Tab 3   ??? omega-3 acid  ethyl esters (LOVAZA) 1 gram capsule TAKE 2 CAPSULES BY MOUTH TWICE DAILY 180 Cap 3   ??? omega-3 acid ethyl esters (LOVAZA) 1 gram capsule Take 1 Cap by mouth daily. 1 Cap 0   ??? triamcinolone (NASACORT AQ) 55 mcg nasal inhaler INSTILL 2 SPRAYS INTO EACH NOSTRIL DAILY 17 g 8   ??? aspirin 81 mg tablet Take 81 mg by mouth daily.     ??? MULTIVITAMIN PO Take 1 Tab by mouth daily.       Visit Vitals  BP 122/72   Pulse 68   Temp 97.2 ??F (36.2 ??C) (Temporal)   Resp 14   Ht 5' 9"  (1.753 m)   Wt 194 lb (88 kg)   BMI 28.65 kg/m??     Carotids are 2+ without bruits.  Lungs are clear to percussion.  Good breath sounds with no wheezing or crackles.  Heart reveals a regular rhythm with normal S1 and S2 no murmur gallop click or rub.  Apical impulse is not palpable.  Abdomen is soft and nontender with no hepatosplenomegaly or masses and no bruits.  Extremities reveal no clubbing cyanosis or edema.  Pulses are 2+.    Results for orders placed or performed during the hospital encounter of 08/08/18   LIPID PANEL   Result Value Ref Range    LIPID PROFILE          Cholesterol, total 134 <200 MG/DL    Triglyceride 80 <150 MG/DL    HDL Cholesterol 53 40 - 60 MG/DL    LDL, calculated 65 0 - 100 MG/DL    VLDL, calculated 16 MG/DL    CHOL/HDL Ratio 2.5 0 - 5.0     METABOLIC PANEL, COMPREHENSIVE   Result Value Ref Range    Sodium 142 136 - 145 mmol/L    Potassium 3.7 3.5 - 5.5 mmol/L    Chloride 106 100 - 111 mmol/L    CO2 30 21 - 32 mmol/L    Anion gap 6 3.0 - 18 mmol/L    Glucose 107 (H) 74 - 99 mg/dL    BUN 27 (H) 7.0 - 18 MG/DL    Creatinine 2.00 (H) 0.6 - 1.3 MG/DL    BUN/Creatinine ratio 14 12 - 20      GFR est AA 40 (L) >60 ml/min/1.2m    GFR est non-AA 33 (L) >60 ml/min/1.754m   Calcium 9.2 8.5 - 10.1 MG/DL    Bilirubin, total 0.6 0.2 - 1.0 MG/DL    ALT (SGPT) 28 16 - 61 U/L    AST (SGOT) 15 10 - 38 U/L    Alk. phosphatase 72 45 - 117 U/L    Protein, total 6.7 6.4 - 8.2 g/dL    Albumin 3.7 3.4 - 5.0 g/dL    Globulin 3.0 2.0 - 4.0 g/dL     A-G Ratio 1.2 0.8 - 1.7       Assessment: #1.  ASHD asymptomatic, he will continue aspirin and carvedilol.  #2.  Chronic renal failure with creatinine much higher than usual, we will check BMP again today and if it is still showing high creatinine probably refer to nephrology.  #3.  Hyperlipidemia doing well, he will continue simvastatin 40 mg each evening.  #4.  Hypertension well controlled, he will continue losartan and Dyazide.    Follow-up with lab in 6 months with Dr. DuKy Barban   Tyannah Sane R. MaCelesta AverMD FACP    Please note:  This document has been produced using voice recognition software. Unrecognized errors in transcription may be present.

## 2018-08-21 NOTE — Telephone Encounter (Signed)
Pt aware of message below and verbalized understanding. No further questions or concerns from pt at this time.

## 2018-08-21 NOTE — Telephone Encounter (Signed)
-----   Message from [name], MD sent at 08/21/2018 11:55 AM EDT -----  Please notify the patient that the labs shows his kidney function to be about the same as it was 3 months ago, much better than it was by the measurement done 2 weeks ago, that was probably a lab error.

## 2018-08-21 NOTE — Progress Notes (Signed)
Please notify the patient that the labs shows his kidney function to be about the same as it was 3 months ago, much better than it was by the measurement done 2 weeks ago, that was probably a lab error.

## 2018-09-30 MED ORDER — TRIAMTERENE-HYDROCHLOROTHIAZIDE 37.5 MG-25 MG CAP
ORAL_CAPSULE | ORAL | 5 refills | Status: DC
Start: 2018-09-30 — End: 2019-02-17

## 2018-10-08 ENCOUNTER — Ambulatory Visit
Admit: 2018-10-08 | Discharge: 2018-10-08 | Payer: PRIVATE HEALTH INSURANCE | Attending: Interventional Cardiology | Primary: Internal Medicine

## 2018-10-08 ENCOUNTER — Ambulatory Visit: Attending: Interventional Cardiology | Primary: Internal Medicine

## 2018-10-08 DIAGNOSIS — I2510 Atherosclerotic heart disease of native coronary artery without angina pectoris: Secondary | ICD-10-CM

## 2018-10-08 NOTE — Progress Notes (Signed)
Raymond [name] presents today for   Chief Complaint   Patient presents with   ??? Follow-up     6 month follow up - no cardiac complaints        Raymond [name] preferred language for health care discussion is english/other.    Is someone accompanying this pt? no    Is the patient using any DME equipment during OV? no    Depression Screening:  3 most recent PHQ Screens 10/08/2018   Little interest or pleasure in doing things Not at all   Feeling down, depressed, irritable, or hopeless Not at all   Total Score PHQ 2 0       Learning Assessment:  Learning Assessment 09/11/2013   PRIMARY LEARNER Patient   HIGHEST LEVEL OF EDUCATION - PRIMARY LEARNER  -   BARRIERS PRIMARY LEARNER -   CO-LEARNER CAREGIVER -   PRIMARY LANGUAGE ENGLISH   INTERPRETER NEED -   LEARNER PREFERENCE PRIMARY LISTENING   ANSWERED BY patient   RELATIONSHIP SELF       Abuse Screening:  Abuse Screening Questionnaire 09/17/2017   Do you ever feel afraid of your partner? N   Are you in a relationship with someone who physically or mentally threatens you? N   Is it safe for you to go home? Y       Fall Risk  Fall Risk Assessment, last 12 mths 10/08/2018   Able to walk? Yes   Fall in past 12 months? No       Pt currently taking Anticoagulant therapy? ASA 81mg every day     Coordination of Care:  1. Have you been to the ER, urgent care clinic since your last visit? Hospitalized since your last visit? no    2. Have you seen or consulted any other health care providers outside of the [facility] since your last visit? Include any pap smears or colon screening. no

## 2018-10-08 NOTE — Progress Notes (Signed)
HISTORY OF PRESENT ILLNESS  Raymond [name] is a 73 y.o. male.    ASSESSMENT and PLAN    Raymond [name] has history of CAD.  Back in January 2006, he presented with sinking feeling and nonsustained VT.  Ultimately, he underwent coronary artery bypass graft surgery with LIMA to LAD as well as sequential SVG to diagonal and OM branch.  Since that time, he has had nuclear scan in 2012 which showed EF of 55% with fixed inferior defect.  His nuclear scan in February 2018 showed inferior fixed defect without ischemia.  His EF was noted to be 52%.  Because of bradycardia, he did have Holter monitor in February 2018 which showed baseline sinus bradycardia without symptoms.  His minimum heart rate was 41 bpm.  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  He does have history hypertension, and hyperlipidemia.  He denies active tobacco use.  He has chronic renal disease with his last creatinine in July 2020 to be about 1.65 from baseline of 1.36.  Diuretic regimen was decreased at that time.  From cardiac standpoint, he has not had any recurrent sinking feeling.  He remains active physically.  He can start bowling as he wishes.  His blood pressures upper normal.  I will continue his current medication regimen.  Because of his chronic renal insufficiency, would not increase his ARB.  Because of his bradycardia, would not increase his carvedilol.  There is no evidence of decompensated CHF noted.  His weight today is 194 pounds.  Ideally, if he was able to lose some weight, his blood pressure will be more easily controlled.  I did discuss with him about possibility of permanent pacemaker insertion down the road if he develops symptomatic bradycardia.  His target LDL is less than 70.  He remains on Zocor 40 mg daily.  He remains on baby aspirin daily.  He asked me about chronic renal insufficiency because he was recently told  about this.  I told him that this was mentioned by Dr. Peggyann Juba 6 months ago.  He does not recall being told.  However, there is numerous mention of renal insufficiency.  I will defer this to you.  He had numerous questions about his heart and his kidneys.  All questions were answered.  Over 20 minutes spent on discussion because of the fact that he is a new patient and all questions had to be looked up.  I will see him back in 6 months.  Thank you.    Encounter Diagnoses   Name Primary?   ??? Atherosclerosis of native coronary artery of native heart without angina pectoris,CABGx3 2006,EF 45% Yes   ??? Bradycardia    ??? Nonsustained ventricular tachycardia ([place])    ??? Hypercholesteremia      current treatment plan is effective, no change in therapy  lab results and schedule of future lab studies reviewed with patient  reviewed diet, exercise and weight control  cardiovascular risk and specific lipid/LDL goals reviewed  use of aspirin to prevent MI and TIA's discussed      HPI   [name] comes in as transfer of care.  He denies any complaints of chest pains, increased dyspnea on exertion or decreased exercise capacity.  He denies any orthopnea or PND.  He denies any palpitations or dizziness.  He wishes to start bowling which is reasonable.    Review of  Systems   Respiratory: Negative for shortness of breath.    Cardiovascular: Negative for chest pain, palpitations, orthopnea, claudication, leg swelling and PND.   All other systems reviewed and are negative.      Physical Exam  Vitals signs and nursing note reviewed.   HENT:      Head: Normocephalic.   Eyes:      Conjunctiva/sclera: Conjunctivae normal.   Neck:      Musculoskeletal: No neck rigidity.   Cardiovascular:      Rate and Rhythm: Bradycardia present.   Pulmonary:      Breath sounds: Normal breath sounds.   Abdominal:      General: Bowel sounds are normal.      Palpations: Abdomen is soft.   Musculoskeletal:         General: No swelling.   Skin:      General: Skin is warm and dry.   Neurological:      General: No focal deficit present.      Mental Status: He is alert and oriented to person, place, and time.   Psychiatric:         Mood and Affect: Mood normal.         Behavior: Behavior normal.         PCP: [name], [name], MD    Past Medical History:   Diagnosis Date   ??? ASHD (arteriosclerotic heart disease)    ??? CABG x 3 02/2004   ??? Cardiac cath 02/29/2004    oRCA 100%.  LM patent.  p/mLAD 85%.  D1 90%.  D2 90%.  CX 45%.  LVEDP 12 mmHg.  EF 45%.  CABG recommended.   ??? Cardiac echocardiogram 11/08/2005    LVE.  EF 45-50%.  Inferior, inferolateral WMA.     ??? Cardiac nuclear imaging test, mod risk 04/11/2013    Intermediate risk.  Mild-mod basal inferior infarction w/mild-mod peri-infarct ischemia in mid inferior, prox & mid inferolateral walls.  EF unreadable due to gating error.  Neg EKG on max EST.  Ex time 6 min.     ??? Cardiovascular RLE venous duplex 08/05/2013    Right leg:  No DVT.   ??? Coronary artery disease     inferior wall myocardial infarction and stenting of the RCA 9/03; Status post CABG X3 in January 2006.   ??? Elevated prostate specific antigen (PSA)    ??? Hypercholesteremia     low HDL   ??? Hypertension    ??? Hypertension    ??? Hypertrophy of prostate with urinary obstruction and other lower urinary tract symptoms (LUTS)    ??? Microscopic hematuria    ??? 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 Pleural effusion on right    ??? Right renal cyst    ??? Ventricular tachycardia, nonsustained (HCC)        Past Surgical History:   Procedure Laterality Date   ??? HX CORONARY ARTERY BYPASS GRAFT  03/03/04    LIMA to the LAD. Sequential SVG to the diagonal and obtuse marginal branch   ??? HX CORONARY STENT PLACEMENT  10/12/01    RCA stented using 3.5 x 13 mm Express 2 stent   ??? HX HEART CATHETERIZATION  03/03/04    followed by CABG X3   ??? HX TONSILLECTOMY     ??? STENT INSERTION  10/12/01    right coronary artery    ??? VASCULAR SURGERY PROCEDURE UNLIST      biopsy of  mass in abdomen, benign       Current Outpatient Medications   Medication Sig Dispense Refill   ??? triamterene-hydroCHLOROthiazide (DYAZIDE) 37.5-25 mg per capsule TAKE 1 CAPSULE BY MOUTH DAILY 30 Cap 5   ??? carvediloL (COREG) 6.25 mg tablet TAKE 1 TABLET BY MOUTH TWICE DAILY WITH MEALS 180 Tab 1   ??? losartan (COZAAR) 50 mg tablet TAKE 1 TABLET BY MOUTH TWICE DAILY 60 Tab 11   ??? simvastatin (ZOCOR) 40 mg tablet TAKE 1 TABLET BY MOUTH EVERY NIGHT 90 Tab 3   ??? omega-3 acid ethyl esters (LOVAZA) 1 gram capsule TAKE 2 CAPSULES BY MOUTH TWICE DAILY 180 Cap 3   ??? omega-3 acid ethyl esters (LOVAZA) 1 gram capsule Take 1 Cap by mouth daily. 1 Cap 0   ??? triamcinolone (NASACORT AQ) 55 mcg nasal inhaler INSTILL 2 SPRAYS INTO EACH NOSTRIL DAILY 17 g 8   ??? aspirin 81 mg tablet Take 81 mg by mouth daily.     ??? MULTIVITAMIN PO Take 1 Tab by mouth daily.         The patient has a family history of    Social History     Tobacco Use   ??? Smoking status: Never Smoker   ??? Smokeless tobacco: Never Used   Substance Use Topics   ??? Alcohol use: Yes     Comment: social   ??? Drug use: No       Lab Results   Component Value Date/Time    Cholesterol, total 151 08/20/2018 01:26 PM    HDL Cholesterol 58 08/20/2018 01:26 PM    LDL, calculated 74 08/20/2018 01:26 PM    Triglyceride 95 08/20/2018 01:26 PM    CHOL/HDL Ratio 2.6 08/20/2018 01:26 PM        BP Readings from Last 3 Encounters:   10/08/18 150/90   08/20/18 122/72   04/09/18 (!) 172/94        Pulse Readings from Last 3 Encounters:   10/08/18 (!) 50   08/20/18 68   04/09/18 (!) 55       Wt Readings from Last 3 Encounters:   10/08/18 88 kg (194 lb)   08/20/18 88 kg (194 lb)   04/09/18 89.8 kg (198 lb)         EKG: unchanged from previous tracings, sinus bradycardia, RBBB, 1st degree AV block.

## 2018-10-08 NOTE — Progress Notes (Signed)
HISTORY OF PRESENT ILLNESS  Raymond [name] is a 73 y.o. male.    ASSESSMENT and PLAN    Raymond [name] has history of CAD.  Back in January 2006, he presented with sinking feeling and nonsustained VT.  Ultimately, he underwent coronary artery bypass graft surgery with LIMA to LAD as well as sequential SVG to diagonal and OM branch.  Since that time, he has had nuclear scan in 2012 which showed EF of 55% with fixed inferior defect.  His nuclear scan in February 2018 showed inferior fixed defect without ischemia.  His EF was noted to be 52%.  Because of bradycardia, he did have Holter monitor in February 2018 which showed baseline sinus bradycardia without symptoms.  His minimum heart rate was 41 bpm.  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  He does have history hypertension, and hyperlipidemia.  He denies active tobacco use.  He has chronic renal disease with his last creatinine in July 2020 to be about 1.65 from baseline of 1.36.  Diuretic regimen was decreased at that time.  From cardiac standpoint, he has not had any recurrent sinking feeling.  He remains active physically.  He can start bowling as he wishes.  His blood pressures upper normal.  I will continue his current medication regimen.  Because of his chronic renal insufficiency, would not increase his ARB.  Because of his bradycardia, would not increase his carvedilol.  There is no evidence of decompensated CHF noted.  His weight today is 194 pounds.  Ideally, if he was able to lose some weight, his blood pressure will be more easily controlled.  I did discuss with him about possibility of permanent pacemaker insertion down the road if he develops symptomatic bradycardia.  His target LDL is less than 70.  He remains on Zocor 40 mg daily.  He remains on baby aspirin daily.  He asked me about chronic renal insufficiency because he was recently told about this.  I  told him that this was mentioned by Dr. Peggyann Juba 6 months ago.  He does not recall being told.  However, there is numerous mention of renal insufficiency.  I will defer this to you.  He had numerous questions about his heart and his kidneys.  All questions were answered.  Over 20 minutes spent on discussion because of the fact that he is a new patient and all questions had to be looked up.  I will see him back in 6 months.  Thank you.    Encounter Diagnoses   Name Primary?   ??? Atherosclerosis of native coronary artery of native heart without angina pectoris,CABGx3 2006,EF 45% Yes   ??? Bradycardia    ??? Nonsustained ventricular tachycardia ([place])    ??? Hypercholesteremia      current treatment plan is effective, no change in therapy  lab results and schedule of future lab studies reviewed with patient  reviewed diet, exercise and weight control  cardiovascular risk and specific lipid/LDL goals reviewed  use of aspirin to prevent MI and TIA's discussed      HPI   Raymond [name] comes in as transfer of care.  He denies any complaints of chest pains, increased dyspnea on exertion or decreased exercise capacity.  He denies any orthopnea or PND.  He denies any palpitations or dizziness.  He wishes to start bowling which is reasonable.    Review of  Systems   Respiratory: Negative for shortness of breath.    Cardiovascular: Negative for chest pain, palpitations, orthopnea, claudication, leg swelling and PND.   All other systems reviewed and are negative.      Physical Exam  Vitals signs and nursing note reviewed.   HENT:      Head: Normocephalic.   Eyes:      Conjunctiva/sclera: Conjunctivae normal.   Neck:      Musculoskeletal: No neck rigidity.   Cardiovascular:      Rate and Rhythm: Bradycardia present.   Pulmonary:      Breath sounds: Normal breath sounds.   Abdominal:      General: Bowel sounds are normal.      Palpations: Abdomen is soft.   Musculoskeletal:         General: No swelling.   Skin:     General: Skin is warm and dry.    Neurological:      General: No focal deficit present.      Mental Status: He is alert and oriented to person, place, and time.   Psychiatric:         Mood and Affect: Mood normal.         Behavior: Behavior normal.         PCP: Bennie Hind, MD    Past Medical History:   Diagnosis Date   ??? ASHD (arteriosclerotic heart disease)    ??? CABG x 3 02/2004   ??? Cardiac cath 02/29/2004    oRCA 100%.  LM patent.  p/mLAD 85%.  D1 90%.  D2 90%.  CX 45%.  LVEDP 12 mmHg.  EF 45%.  CABG recommended.   ??? Cardiac echocardiogram 11/08/2005    LVE.  EF 45-50%.  Inferior, inferolateral WMA.     ??? Cardiac nuclear imaging test, mod risk 04/11/2013    Intermediate risk.  Mild-mod basal inferior infarction w/mild-mod peri-infarct ischemia in mid inferior, prox & mid inferolateral walls.  EF unreadable due to gating error.  Neg EKG on max EST.  Ex time 6 min.     ??? Cardiovascular RLE venous duplex 08/05/2013    Right leg:  No DVT.   ??? Coronary artery disease     inferior wall myocardial infarction and stenting of the RCA 9/03; Status post CABG X3 in January 2006.   ??? Elevated prostate specific antigen (PSA)    ??? Hypercholesteremia     low HDL   ??? Hypertension    ??? Hypertension    ??? Hypertrophy of prostate with urinary obstruction and other lower urinary tract symptoms (LUTS)    ??? Microscopic hematuria    ??? 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 Pleural effusion on right    ??? Right renal cyst    ??? Ventricular tachycardia, nonsustained (H[facility])        Past Surgical History:   Procedure Laterality Date   ??? HX CORONARY ARTERY BYPASS GRAFT  03/03/04    LIMA to the LAD. Sequential SVG to the diagonal and obtuse marginal branch   ??? HX CORONARY STENT PLACEMENT  10/12/01    RCA stented using 3.5 x 13 mm Express 2 stent   ??? HX HEART CATHETERIZATION  03/03/04    followed by CABG X3   ??? HX TONSILLECTOMY     ??? STENT INSERTION  10/12/01    right coronary artery   ??? VASCULAR SURGERY PROCEDURE UNLIST      biopsy of mass  in abdomen,  benign       Current Outpatient Medications   Medication Sig Dispense Refill   ??? triamterene-hydroCHLOROthiazide (DYAZIDE) 37.5-25 mg per capsule TAKE 1 CAPSULE BY MOUTH DAILY 30 Cap 5   ??? carvediloL (COREG) 6.25 mg tablet TAKE 1 TABLET BY MOUTH TWICE DAILY WITH MEALS 180 Tab 1   ??? losartan (COZAAR) 50 mg tablet TAKE 1 TABLET BY MOUTH TWICE DAILY 60 Tab 11   ??? simvastatin (ZOCOR) 40 mg tablet TAKE 1 TABLET BY MOUTH EVERY NIGHT 90 Tab 3   ??? omega-3 acid ethyl esters (LOVAZA) 1 gram capsule TAKE 2 CAPSULES BY MOUTH TWICE DAILY 180 Cap 3   ??? omega-3 acid ethyl esters (LOVAZA) 1 gram capsule Take 1 Cap by mouth daily. 1 Cap 0   ??? triamcinolone (NASACORT AQ) 55 mcg nasal inhaler INSTILL 2 SPRAYS INTO EACH NOSTRIL DAILY 17 g 8   ??? aspirin 81 mg tablet Take 81 mg by mouth daily.     ??? MULTIVITAMIN PO Take 1 Tab by mouth daily.         The patient has a family history of    Social History     Tobacco Use   ??? Smoking status: Never Smoker   ??? Smokeless tobacco: Never Used   Substance Use Topics   ??? Alcohol use: Yes     Comment: social   ??? Drug use: No       Lab Results   Component Value Date/Time    Cholesterol, total 151 08/20/2018 01:26 PM    HDL Cholesterol 58 08/20/2018 01:26 PM    LDL, calculated 74 08/20/2018 01:26 PM    Triglyceride 95 08/20/2018 01:26 PM    CHOL/HDL Ratio 2.6 08/20/2018 01:26 PM        BP Readings from Last 3 Encounters:   10/08/18 150/90   08/20/18 122/72   04/09/18 (!) 172/94        Pulse Readings from Last 3 Encounters:   10/08/18 (!) 50   08/20/18 68   04/09/18 (!) 55       Wt Readings from Last 3 Encounters:   10/08/18 88 kg (194 lb)   08/20/18 88 kg (194 lb)   04/09/18 89.8 kg (198 lb)         EKG: unchanged from previous tracings, sinus bradycardia, RBBB, 1st degree AV block.

## 2018-10-08 NOTE — Progress Notes (Signed)
 Raymond [name] presents today for   Chief Complaint   Patient presents with   . Follow-up     6 month follow up - no cardiac complaints        Raymond [name] preferred language for health care discussion is english/other.    Is someone accompanying this pt? no    Is the patient using any DME equipment during OV? no    Depression Screening:  3 most recent PHQ Screens 10/08/2018   Little interest or pleasure in doing things Not at all   Feeling down, depressed, irritable, or hopeless Not at all   Total Score PHQ 2 0       Learning Assessment:  Learning Assessment 09/11/2013   PRIMARY LEARNER Patient   HIGHEST LEVEL OF EDUCATION - PRIMARY LEARNER  -   BARRIERS PRIMARY LEARNER -   CO-LEARNER CAREGIVER -   PRIMARY LANGUAGE ENGLISH   INTERPRETER NEED -   LEARNER PREFERENCE PRIMARY LISTENING   ANSWERED BY patient   RELATIONSHIP SELF       Abuse Screening:  Abuse Screening Questionnaire 09/17/2017   Do you ever feel afraid of your partner? N   Are you in a relationship with someone who physically or mentally threatens you? N   Is it safe for you to go home? Y       Fall Risk  Fall Risk Assessment, last 12 mths 10/08/2018   Able to walk? Yes   Fall in past 12 months? No       Pt currently taking Anticoagulant therapy? ASA 81mg  every day     Coordination of Care:  1. Have you been to the ER, urgent care clinic since your last visit? Hospitalized since your last visit? no    2. Have you seen or consulted any other health care providers outside of the [facility] since your last visit? Include any pap smears or colon screening. no

## 2018-10-15 ENCOUNTER — Encounter: Attending: Interventional Cardiology | Primary: Internal Medicine

## 2018-10-29 NOTE — Telephone Encounter (Signed)
Last Visit: 08/20/18 with MD Jill Poling  Next Appointment: 02/17/19 with MD Tyler Pita  Previous Refill Encounter(s): 11/07/17 #90 with 3 refills    Requested Prescriptions     Pending Prescriptions Disp Refills   ??? simvastatin (ZOCOR) 40 mg tablet 90 Tab 3     Sig: Take 1 Tab by mouth nightly.

## 2018-10-30 MED ORDER — SIMVASTATIN 40 MG TAB
40 mg | ORAL_TABLET | Freq: Every evening | ORAL | 3 refills | Status: DC
Start: 2018-10-30 — End: 2019-10-27

## 2019-01-13 MED ORDER — CARVEDILOL 6.25 MG TAB
6.25 mg | ORAL_TABLET | Freq: Two times a day (BID) | ORAL | 0 refills | Status: DC
Start: 2019-01-13 — End: 2019-04-14

## 2019-01-13 NOTE — Telephone Encounter (Signed)
Last Visit: 08/20/18 with MD Jill Poling  Next Appointment: 02/17/19 with MD Tyler Pita  Previous Refill Encounter(s): 07/15/18 #180 with 1 refill    Requested Prescriptions     Pending Prescriptions Disp Refills   ??? carvediloL (COREG) 6.25 mg tablet 180 Tab 0     Sig: Take 1 Tab by mouth two (2) times daily (with meals).

## 2019-02-10 ENCOUNTER — Other Ambulatory Visit: Admit: 2019-02-10 | Discharge: 2019-02-10 | Payer: BLUE CROSS/BLUE SHIELD | Primary: Internal Medicine

## 2019-02-10 DIAGNOSIS — Z125 Encounter for screening for malignant neoplasm of prostate: Secondary | ICD-10-CM

## 2019-02-10 DIAGNOSIS — I10 Essential (primary) hypertension: Secondary | ICD-10-CM

## 2019-02-11 ENCOUNTER — Inpatient Hospital Stay: Admit: 2019-02-11 | Payer: BLUE CROSS/BLUE SHIELD | Primary: Internal Medicine

## 2019-02-11 LAB — METABOLIC PANEL, COMPREHENSIVE
A-G Ratio: 1.2 (ref 0.8–1.7)
ALT (SGPT): 25 U/L (ref 16–61)
AST (SGOT): 15 U/L (ref 10–38)
Albumin: 3.6 g/dL (ref 3.4–5.0)
Alk. phosphatase: 63 U/L (ref 45–117)
Anion gap: 6 mmol/L (ref 3.0–18)
BUN/Creatinine ratio: 13 (ref 12–20)
BUN: 22 MG/DL — ABNORMAL HIGH (ref 7.0–18)
Bilirubin, total: 0.6 MG/DL (ref 0.2–1.0)
CO2: 29 mmol/L (ref 21–32)
Calcium: 8.8 MG/DL (ref 8.5–10.1)
Chloride: 108 mmol/L (ref 100–111)
Creatinine: 1.74 MG/DL — ABNORMAL HIGH (ref 0.6–1.3)
GFR est AA: 47 mL/min/{1.73_m2} — ABNORMAL LOW (ref >60)
GFR est non-AA: 39 mL/min/{1.73_m2} — ABNORMAL LOW (ref >60)
Globulin: 3 g/dL (ref 2.0–4.0)
Glucose: 95 mg/dL (ref 74–99)
Potassium: 3.6 mmol/L (ref 3.5–5.5)
Protein, total: 6.6 g/dL (ref 6.4–8.2)
Sodium: 143 mmol/L (ref 136–145)

## 2019-02-11 LAB — CBC W/O DIFF
HCT: 39.4 % (ref 36.0–48.0)
HGB: 12.9 g/dL — ABNORMAL LOW (ref 13.0–16.0)
MCH: 31.2 PG (ref 24.0–34.0)
MCHC: 32.7 g/dL (ref 31.0–37.0)
MCV: 95.2 FL (ref 74.0–97.0)
MPV: 10.7 FL (ref 9.2–11.8)
PLATELET: 194 10*3/uL (ref 135–420)
RBC: 4.14 M/uL — ABNORMAL LOW (ref 4.70–5.50)
RDW: 13.4 % (ref 11.6–14.5)
WBC: 6 10*3/uL (ref 4.6–13.2)

## 2019-02-11 LAB — HEMOGLOBIN A1C WITH EAG
Est. average glucose: 111 mg/dL
Hemoglobin A1c: 5.5 % (ref 4.2–5.6)

## 2019-02-11 LAB — LIPID PANEL
CHOL/HDL Ratio: 2.7 (ref 0–5.0)
Chol/HDL Ratio: 2.7 (ref 0–5.0)
Cholesterol, Total: 134 MG/DL (ref <200)
Cholesterol, total: 134 MG/DL (ref <200)
HDL Cholesterol: 50 MG/DL (ref 40–60)
HDL: 50 MG/DL (ref 40–60)
LDL Calculated: 66.8 MG/DL (ref 0–100)
LDL, calculated: 66.8 MG/DL (ref 0–100)
Triglyceride: 86 MG/DL (ref <150)
Triglycerides: 86 MG/DL (ref <150)
VLDL Cholesterol Calculated: 17.2 MG/DL
VLDL, calculated: 17.2 MG/DL

## 2019-02-11 LAB — PSA SCREENING (SCREENING): Prostate Specific Ag: 5.3 ng/mL — ABNORMAL HIGH (ref 0.0–4.0)

## 2019-02-11 LAB — CBC
Hematocrit: 39.4 % (ref 36.0–48.0)
Hemoglobin: 12.9 g/dL — ABNORMAL LOW (ref 13.0–16.0)
MCH: 31.2 PG (ref 24.0–34.0)
MCHC: 32.7 g/dL (ref 31.0–37.0)
MCV: 95.2 FL (ref 74.0–97.0)
MPV: 10.7 FL (ref 9.2–11.8)
Platelets: 194 10*3/uL (ref 135–420)
RBC: 4.14 M/uL — ABNORMAL LOW (ref 4.70–5.50)
RDW: 13.4 % (ref 11.6–14.5)
WBC: 6 10*3/uL (ref 4.6–13.2)

## 2019-02-11 LAB — COMPREHENSIVE METABOLIC PANEL
ALT: 25 U/L (ref 16–61)
AST: 15 U/L (ref 10–38)
Albumin/Globulin Ratio: 1.2 (ref 0.8–1.7)
Albumin: 3.6 g/dL (ref 3.4–5.0)
Alkaline Phosphatase: 63 U/L (ref 45–117)
Anion Gap: 6 mmol/L (ref 3.0–18)
BUN: 22 MG/DL — ABNORMAL HIGH (ref 7.0–18)
Bun/Cre Ratio: 13 (ref 12–20)
CO2: 29 mmol/L (ref 21–32)
Calcium: 8.8 MG/DL (ref 8.5–10.1)
Chloride: 108 mmol/L (ref 100–111)
Creatinine: 1.74 MG/DL — ABNORMAL HIGH (ref 0.6–1.3)
EGFR IF NonAfrican American: 39 mL/min/{1.73_m2} — ABNORMAL LOW (ref >60)
GFR African American: 47 mL/min/{1.73_m2} — ABNORMAL LOW (ref >60)
Globulin: 3 g/dL (ref 2.0–4.0)
Glucose: 95 mg/dL (ref 74–99)
Potassium: 3.6 mmol/L (ref 3.5–5.5)
Sodium: 143 mmol/L (ref 136–145)
Total Bilirubin: 0.6 MG/DL (ref 0.2–1.0)
Total Protein: 6.6 g/dL (ref 6.4–8.2)

## 2019-02-11 LAB — PSA SCREENING: Pros. Spec. Antigen: 5.3 ng/mL — ABNORMAL HIGH (ref 0.0–4.0)

## 2019-02-11 LAB — HEMOGLOBIN A1C W/EAG
Hemoglobin A1C: 5.5 % (ref 4.2–5.6)
eAG: 111 mg/dL

## 2019-02-17 ENCOUNTER — Ambulatory Visit
Admit: 2019-02-17 | Discharge: 2019-02-17 | Payer: BLUE CROSS/BLUE SHIELD | Attending: Internal Medicine | Primary: Internal Medicine

## 2019-02-17 ENCOUNTER — Encounter: Attending: Internal Medicine | Primary: Internal Medicine

## 2019-02-17 ENCOUNTER — Ambulatory Visit: Attending: Internal Medicine | Primary: Internal Medicine

## 2019-02-17 DIAGNOSIS — I2510 Atherosclerotic heart disease of native coronary artery without angina pectoris: Secondary | ICD-10-CM

## 2019-02-17 MED ORDER — AMLODIPINE 2.5 MG TAB
2.5 mg | ORAL_TABLET | Freq: Every day | ORAL | 5 refills | Status: DC
Start: 2019-02-17 — End: 2019-08-12

## 2019-02-17 NOTE — Progress Notes (Signed)
74 y.o. WHITE OR CAUCASIAN male who presents for evaluation.    He is transferring care from Dr Celesta Aver.  At least 1 hour was spent reviewing the EMR and reviewing all available records    No cardiovascular complaints.  He is now being followed by Dr Boyd Kerbs. Tries to remain active although no set exercise program.  Does a lot of yardwork, bowls once weekly.Marland Kitchen  He's been lovaza per cardiology but not offered vascepa    No gi issues.  Interestingly, he's never had colo, mainly due to trepidation about the procedure. Has never been offered cologuard    He does admit to nocturia once a night.  Was being followed by urology for the elev psa but then the office closed down.  Interested in establishing with [facility]    Denies polyuria, polydipsia, nocturia, vision change.  Not checking sugars at this time. He's doing well with dietary mgmt, keeping the weight down    He does report what sounds like CTS bilat, usually late at night and sometimes it wakes him up, L>R     LAST MEDICARE WELLNESS EXAM: 09/10/15, 02/17/19    Past Medical History:   Diagnosis Date   ??? Acute MI, inferior wall ([place]) 10/12/2001    s/p stent RCA   ??? BPH with obstruction/lower urinary tract symptoms    ??? CABG x 3 02/2004   ??? Cardiac cath 02/29/2004    oRCA 100%.  LM patent.  p/mLAD 85%.  D1 90%.  D2 90%.  CX 45%.  LVEDP 12 mmHg.  EF 45%.  CABG recommended.   ??? Cardiac nuclear imaging test, mod risk 04/11/2013    Intermediate risk.  Mild-mod basal inferior infarction w/mild-mod peri-infarct ischemia in mid inferior, prox & mid inferolateral walls.  EF unreadable due to gating error.  Neg EKG on max EST.  Ex time 6 min.     ??? CKD (chronic kidney disease) stage 3, GFR 30-59 ml/min    ??? Coronary artery disease     IMI w stent of RCA 9/03; s/p CABG X3 1/16   ??? Dyslipidemia    ??? Elevated prostate specific antigen (PSA)     Dr Cristina Gong   ??? H/O echocardiogram     LVE.  EF 45-50%.  Inferior, inferolateral WMA (10/07)   ??? Hypertension    ??? IFG (impaired fasting glucose)     ??? Microscopic hematuria    ??? Pleural effusion on right    ??? Right renal cyst    ??? 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 Skin cancer     SCC   ??? Ventricular tachycardia, nonsustained ([place])      Past Surgical History:   Procedure Laterality Date   ??? HX CORONARY ARTERY BYPASS GRAFT  03/03/04    LIMA to the LAD. Sequential SVG to the diagonal and obtuse marginal branch   ??? HX CORONARY STENT PLACEMENT  10/12/01    RCA stented using 3.5 x 13 mm Express 2 stent   ??? HX OTHER SURGICAL  2014    s/p resection neurofibroma RLE   ??? HX TONSILLECTOMY       Social History     Socioeconomic History   ??? Marital status: MARRIED     Spouse name: Not on file   ??? Number of children: Not on file   ??? Years of education: Not on file   ???  Highest education level: Not on file   Occupational History   ??? Not on file   Social Needs   ??? Financial resource strain: Not on file   ??? Food insecurity     Worry: Not on file     Inability: Not on file   ??? Transportation needs     Medical: Not on file     Non-medical: Not on file   Tobacco Use   ??? Smoking status: Never Smoker   ??? Smokeless tobacco: Never Used   Substance and Sexual Activity   ??? Alcohol use: Yes     Comment: social   ??? Drug use: No   ??? Sexual activity: Not on file   Lifestyle   ??? Physical activity     Days per week: Not on file     Minutes per session: Not on file   ??? Stress: Not on file   Relationships   ??? Social Product manager on phone: Not on file     Gets together: Not on file     Attends religious service: Not on file     Active member of club or organization: Not on file     Attends meetings of clubs or organizations: Not on file     Relationship status: Not on file   ??? Intimate partner violence     Fear of current or ex partner: Not on file     Emotionally abused: Not on file     Physically abused: Not on file     Forced sexual activity: Not on file   Other Topics Concern   ??? Not on file   Social History Narrative   ??? Not on  file     Current Outpatient Medications   Medication Sig   ??? amLODIPine (NORVASC) 2.5 mg tablet Take 1 Tab by mouth daily.   ??? carvediloL (COREG) 6.25 mg tablet Take 1 Tab by mouth two (2) times daily (with meals).   ??? simvastatin (ZOCOR) 40 mg tablet Take 1 Tab by mouth nightly.   ??? triamterene-hydroCHLOROthiazide (DYAZIDE) 37.5-25 mg per capsule TAKE 1 CAPSULE BY MOUTH DAILY   ??? losartan (COZAAR) 50 mg tablet TAKE 1 TABLET BY MOUTH TWICE DAILY   ??? omega-3 acid ethyl esters (LOVAZA) 1 gram capsule Take 1 Cap by mouth daily.   ??? triamcinolone (NASACORT AQ) 55 mcg nasal inhaler INSTILL 2 SPRAYS INTO EACH NOSTRIL DAILY   ??? aspirin 81 mg tablet Take 81 mg by mouth daily.   ??? MULTIVITAMIN PO Take 1 Tab by mouth daily.   ??? omega-3 acid ethyl esters (LOVAZA) 1 gram capsule TAKE 2 CAPSULES BY MOUTH TWICE DAILY     No current facility-administered medications for this visit.      Allergies   Allergen Reactions   ??? Ace Inhibitors Unknown (comments) and Cough     Intolerance b/c of dry coughing   ??? Bee Sting [Sting, Bee] Hives     REVIEW OF SYSTEMS: colo never  Ophtho ??? no vision change or eye pain  Oral ??? no mouth pain, tongue or tooth problems  Ears ??? no hearing loss, ear pain, fullness, no swallowing problems  Cardiac ??? no CP, PND, orthopnea, edema, palpitations or syncope  Chest ??? no breast masses  Resp ??? no wheezing, chronic coughing, dyspnea  GI ??? no heartburn, nausea, vomiting, change in bowel habits, bleeding, hemorrhoids  Urinary ??? no dysuria, hematuria, flank pain, urgency, frequency  Visit Vitals  BP 127/77   Pulse (!) 53   Temp 97.2 ??F (36.2 ??C) (Temporal)   Resp 14   Ht 5' 9"  (1.753 m)   Wt 191 lb (86.6 kg)   SpO2 97%   BMI 28.21 kg/m??     A&O x3  Affect is appropriate.  Mood stable  No apparent distress  Anicteric, no JVD, adenopathy or thyromegaly.   No carotid bruits or radiated murmur  Lungs clear to auscultation, no wheezes or rales  Heart showed regular rate and rhythm. No murmur, rubs, gallops  Abdomen  soft nontender, no hepatosplenomegaly or masses.   Extremities without edema.  Pulses 1-2+ symmetrically    LABS  From 2/16 showed gluc 101, cr 1.28, gfr 56,  alt 12,                chol 143, tg 65, hdl 61, ldl-c 69, wbc 5.0, hb 13.3, plt 175, tsh 2.94, ua neg  From 2/17 showed gluc 96,   cr 1.19, gfr>60, alt 29, hba1c 5.4, umar 5.0,  chol 170, tg 65, hdl 66, ldl-c 91, wbc 5.3, hb 13.6, plt 184, tsh 2.92, ua neg  From 2/18 showed gluc 104, cr 1.33, gfr 53,  alt 13, hba1c 5.6, umar 6.0,  chol 121, tg 51, hdl 58, ldl-c 53, wbc 5.2, hb 13.6, plt 177, tsh 3.09  From 8/18 showed gluc 104, cr 1.26, gfr 56,  alt 33,                chol 135, tg 58, hdl 61, ldl-c 62, wbc 6.0, hb 13.6, plt 180  From 8/19 showed gluc 97,   cr 1.36, gfr 51,     chol 134, tg 82, hdl 51, ldl-c 67,                psa 2.90  From 2/20 showed gluc 96,   cr 1.28, gfr 55,  alt 24,                chol 137, tg 66, hdl 57, ldl-c 67  From 7/20 showed gluc 93,   cr 1.67, gfr 40,  alt 27,     chol 151, tg 95, hdl 58, ldl-c 74    Results for orders placed or performed during the hospital encounter of 02/10/19   PSA SCREENING (SCREENING)   Result Value Ref Range    Prostate Specific Ag 5.3 (H) 0.0 - 4.0 ng/mL   METABOLIC PANEL, COMPREHENSIVE   Result Value Ref Range    Sodium 143 136 - 145 mmol/L    Potassium 3.6 3.5 - 5.5 mmol/L    Chloride 108 100 - 111 mmol/L    CO2 29 21 - 32 mmol/L    Anion gap 6 3.0 - 18 mmol/L    Glucose 95 74 - 99 mg/dL    BUN 22 (H) 7.0 - 18 MG/DL    Creatinine 1.74 (H) 0.6 - 1.3 MG/DL    BUN/Creatinine ratio 13 12 - 20      GFR est AA 47 (L) >60 ml/min/1.71m    GFR est non-AA 39 (L) >60 ml/min/1.755m   Calcium 8.8 8.5 - 10.1 MG/DL    Bilirubin, total 0.6 0.2 - 1.0 MG/DL    ALT (SGPT) 25 16 - 61 U/L    AST (SGOT) 15 10 - 38 U/L    Alk. phosphatase 63 45 - 117 U/L    Protein, total 6.6 6.4 - 8.2 g/dL  Albumin 3.6 3.4 - 5.0 g/dL    Globulin 3.0 2.0 - 4.0 g/dL    A-G Ratio 1.2 0.8 - 1.7     CBC W/O DIFF   Result Value Ref Range     WBC 6.0 4.6 - 13.2 K/uL    RBC 4.14 (L) 4.70 - 5.50 M/uL    HGB 12.9 (L) 13.0 - 16.0 g/dL    HCT 39.4 36.0 - 48.0 %    MCV 95.2 74.0 - 97.0 FL    MCH 31.2 24.0 - 34.0 PG    MCHC 32.7 31.0 - 37.0 g/dL    RDW 13.4 11.6 - 14.5 %    PLATELET 194 135 - 420 K/uL    MPV 10.7 9.2 - 11.8 FL   LIPID PANEL   Result Value Ref Range    LIPID PROFILE          Cholesterol, total 134 <200 MG/DL    Triglyceride 86 <150 MG/DL    HDL Cholesterol 50 40 - 60 MG/DL    LDL, calculated 66.8 0 - 100 MG/DL    VLDL, calculated 17.2 MG/DL    CHOL/HDL Ratio 2.7 0 - 5.0     HEMOGLOBIN A1C WITH EAG   Result Value Ref Range    Hemoglobin A1c 5.5 4.2 - 5.6 %    Est. average glucose 111 mg/dL         We reviewed the patient's labs from the last several visits to point out trends in the numbers      Patient Active Problem List   Diagnosis Code   ??? CAD, history of inferior wall myocardial infarction/status post stenting of the RCA in September 2003/status post CABG X3 in January 2006/EF 45%. I25.10   ??? PVC's    ??? BPH with obstruction/lower urinary tract symptoms N40.1, N13.8   ??? Elevated prostate specific antigen (PSA) R97.20   ??? Hyperlipidemia E78.5   ??? Primary hypertension I10   ??? Impaired fasting glucose R73.01   ??? CKD (chronic kidney disease) stage 3, GFR 30-59 ml/min N18.30     Assessment and plan:  1. CHD.  F/U Dr Boyd Kerbs  2. BPH/LUTS, elev psa.  Will establish with Dr Darreld Mclean group  3. HLP.  Continue current regimen.  We did discuss lovaza vs vascepa, at this time it's a cost issues  4. HTN.  Will change diuretic to amlo as below, sfx discussed  5. IFG.  Lifestyle and dietary measures. Weight loss would be ideal  6. CKD.  Trial of stopping diuretic and changing to amlo.  Recheck 3 mos  7. Cologuard rec after discussing pros and cons in comparison to colonoscopy        RTC 5/21        Instructions above were handwritten on the lab results sheet that I gave the patient today    Above conditions discussed at length and patient vocalized understanding.   All questions answered to patient satisfaction    TOTAL time 40 min spent of which at least 30 min was on counseling, answering questions and coordination of care

## 2019-02-17 NOTE — Progress Notes (Signed)
74 y.o. WHITE OR CAUCASIAN male who presents for evaluation.    He is transferring care from Dr Celesta Aver.  At least 1 hour was spent reviewing the EMR and reviewing all available records    No cardiovascular complaints.  He is now being followed by Dr Boyd Kerbs. Tries to remain active although no set exercise program.  Does a lot of yardwork, bowls once weekly.Marland Kitchen  He's been lovaza per cardiology but not offered vascepa    No gi issues.  Interestingly, he's never had colo, mainly due to trepidation about the procedure. Has never been offered cologuard    He does admit to nocturia once a night.  Was being followed by urology for the elev psa but then the office closed down.  Interested in establishing with [facility]    Denies polyuria, polydipsia, nocturia, vision change.  Not checking sugars at this time. He's doing well with dietary mgmt, keeping the weight down    He does report what sounds like CTS bilat, usually late at night and sometimes it wakes him up, L>R     LAST MEDICARE WELLNESS EXAM: 09/10/15, 02/17/19    Past Medical History:   Diagnosis Date   ??? Acute MI, inferior wall ([place]) 10/12/2001    s/p stent RCA   ??? BPH with obstruction/lower urinary tract symptoms    ??? CABG x 3 02/2004   ??? Cardiac cath 02/29/2004    oRCA 100%.  LM patent.  p/mLAD 85%.  D1 90%.  D2 90%.  CX 45%.  LVEDP 12 mmHg.  EF 45%.  CABG recommended.   ??? Cardiac nuclear imaging test, mod risk 04/11/2013    Intermediate risk.  Mild-mod basal inferior infarction w/mild-mod peri-infarct ischemia in mid inferior, prox & mid inferolateral walls.  EF unreadable due to gating error.  Neg EKG on max EST.  Ex time 6 min.     ??? CKD (chronic kidney disease) stage 3, GFR 30-59 ml/min    ??? Coronary artery disease     IMI w stent of RCA 9/03; s/p CABG X3 1/16   ??? Dyslipidemia    ??? Elevated prostate specific antigen (PSA)     Dr Cristina Gong   ??? H/O echocardiogram     LVE.  EF 45-50%.  Inferior, inferolateral WMA (10/07)   ??? Hypertension    ??? IFG (impaired fasting glucose)     ??? Microscopic hematuria    ??? Pleural effusion on right    ??? Right renal cyst    ??? 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 Skin cancer     SCC   ??? Ventricular tachycardia, nonsustained ([place])      Past Surgical History:   Procedure Laterality Date   ??? HX CORONARY ARTERY BYPASS GRAFT  03/03/04    LIMA to the LAD. Sequential SVG to the diagonal and obtuse marginal branch   ??? HX CORONARY STENT PLACEMENT  10/12/01    RCA stented using 3.5 x 13 mm Express 2 stent   ??? HX OTHER SURGICAL  2014    s/p resection neurofibroma RLE   ??? HX TONSILLECTOMY       Social History     Socioeconomic History   ??? Marital status: MARRIED     Spouse name: Not on file   ??? Number of children: Not on file   ??? Years of education: Not on file   ???  Highest education level: Not on file   Occupational History   ??? Not on file   Social Needs   ??? Financial resource strain: Not on file   ??? Food insecurity     Worry: Not on file     Inability: Not on file   ??? Transportation needs     Medical: Not on file     Non-medical: Not on file   Tobacco Use   ??? Smoking status: Never Smoker   ??? Smokeless tobacco: Never Used   Substance and Sexual Activity   ??? Alcohol use: Yes     Comment: social   ??? Drug use: No   ??? Sexual activity: Not on file   Lifestyle   ??? Physical activity     Days per week: Not on file     Minutes per session: Not on file   ??? Stress: Not on file   Relationships   ??? Social Product manager on phone: Not on file     Gets together: Not on file     Attends religious service: Not on file     Active member of club or organization: Not on file     Attends meetings of clubs or organizations: Not on file     Relationship status: Not on file   ??? Intimate partner violence     Fear of current or ex partner: Not on file     Emotionally abused: Not on file     Physically abused: Not on file     Forced sexual activity: Not on file   Other Topics Concern   ??? Not on file   Social History Narrative   ??? Not on  file     Current Outpatient Medications   Medication Sig   ??? amLODIPine (NORVASC) 2.5 mg tablet Take 1 Tab by mouth daily.   ??? carvediloL (COREG) 6.25 mg tablet Take 1 Tab by mouth two (2) times daily (with meals).   ??? simvastatin (ZOCOR) 40 mg tablet Take 1 Tab by mouth nightly.   ??? triamterene-hydroCHLOROthiazide (DYAZIDE) 37.5-25 mg per capsule TAKE 1 CAPSULE BY MOUTH DAILY   ??? losartan (COZAAR) 50 mg tablet TAKE 1 TABLET BY MOUTH TWICE DAILY   ??? omega-3 acid ethyl esters (LOVAZA) 1 gram capsule Take 1 Cap by mouth daily.   ??? triamcinolone (NASACORT AQ) 55 mcg nasal inhaler INSTILL 2 SPRAYS INTO EACH NOSTRIL DAILY   ??? aspirin 81 mg tablet Take 81 mg by mouth daily.   ??? MULTIVITAMIN PO Take 1 Tab by mouth daily.   ??? omega-3 acid ethyl esters (LOVAZA) 1 gram capsule TAKE 2 CAPSULES BY MOUTH TWICE DAILY     No current facility-administered medications for this visit.      Allergies   Allergen Reactions   ??? Ace Inhibitors Unknown (comments) and Cough     Intolerance b/c of dry coughing   ??? Bee Sting [Sting, Bee] Hives     REVIEW OF SYSTEMS: colo never  Ophtho ??? no vision change or eye pain  Oral ??? no mouth pain, tongue or tooth problems  Ears ??? no hearing loss, ear pain, fullness, no swallowing problems  Cardiac ??? no CP, PND, orthopnea, edema, palpitations or syncope  Chest ??? no breast masses  Resp ??? no wheezing, chronic coughing, dyspnea  GI ??? no heartburn, nausea, vomiting, change in bowel habits, bleeding, hemorrhoids  Urinary ??? no dysuria, hematuria, flank pain, urgency, frequency  Visit Vitals  BP 127/77   Pulse (!) 53   Temp 97.2 ??F (36.2 ??C) (Temporal)   Resp 14   Ht 5' 9"  (1.753 m)   Wt 191 lb (86.6 kg)   SpO2 97%   BMI 28.21 kg/m??     A&O x3  Affect is appropriate.  Mood stable  No apparent distress  Anicteric, no JVD, adenopathy or thyromegaly.   No carotid bruits or radiated murmur  Lungs clear to auscultation, no wheezes or rales  Heart showed regular rate and rhythm. No murmur, rubs, gallops  Abdomen  soft nontender, no hepatosplenomegaly or masses.   Extremities without edema.  Pulses 1-2+ symmetrically    LABS  From 2/16 showed gluc 101, cr 1.28, gfr 56,  alt 12,                chol 143, tg 65, hdl 61, ldl-c 69, wbc 5.0, hb 13.3, plt 175, tsh 2.94, ua neg  From 2/17 showed gluc 96,   cr 1.19, gfr>60, alt 29, hba1c 5.4, umar 5.0,  chol 170, tg 65, hdl 66, ldl-c 91, wbc 5.3, hb 13.6, plt 184, tsh 2.92, ua neg  From 2/18 showed gluc 104, cr 1.33, gfr 53,  alt 13, hba1c 5.6, umar 6.0,  chol 121, tg 51, hdl 58, ldl-c 53, wbc 5.2, hb 13.6, plt 177, tsh 3.09  From 8/18 showed gluc 104, cr 1.26, gfr 56,  alt 33,                chol 135, tg 58, hdl 61, ldl-c 62, wbc 6.0, hb 13.6, plt 180  From 8/19 showed gluc 97,   cr 1.36, gfr 51,     chol 134, tg 82, hdl 51, ldl-c 67,                psa 2.90  From 2/20 showed gluc 96,   cr 1.28, gfr 55,  alt 24,                chol 137, tg 66, hdl 57, ldl-c 67  From 7/20 showed gluc 93,   cr 1.67, gfr 40,  alt 27,     chol 151, tg 95, hdl 58, ldl-c 74    Results for orders placed or performed during the hospital encounter of 02/10/19   PSA SCREENING (SCREENING)   Result Value Ref Range    Prostate Specific Ag 5.3 (H) 0.0 - 4.0 ng/mL   METABOLIC PANEL, COMPREHENSIVE   Result Value Ref Range    Sodium 143 136 - 145 mmol/L    Potassium 3.6 3.5 - 5.5 mmol/L    Chloride 108 100 - 111 mmol/L    CO2 29 21 - 32 mmol/L    Anion gap 6 3.0 - 18 mmol/L    Glucose 95 74 - 99 mg/dL    BUN 22 (H) 7.0 - 18 MG/DL    Creatinine 1.74 (H) 0.6 - 1.3 MG/DL    BUN/Creatinine ratio 13 12 - 20      GFR est AA 47 (L) >60 ml/min/1.77m    GFR est non-AA 39 (L) >60 ml/min/1.775m   Calcium 8.8 8.5 - 10.1 MG/DL    Bilirubin, total 0.6 0.2 - 1.0 MG/DL    ALT (SGPT) 25 16 - 61 U/L    AST (SGOT) 15 10 - 38 U/L    Alk. phosphatase 63 45 - 117 U/L    Protein, total 6.6 6.4 - 8.2 g/dL  Albumin 3.6 3.4 - 5.0 g/dL    Globulin 3.0 2.0 - 4.0 g/dL    A-G Ratio 1.2 0.8 - 1.7     CBC W/O DIFF   Result Value Ref Range     WBC 6.0 4.6 - 13.2 K/uL    RBC 4.14 (L) 4.70 - 5.50 M/uL    HGB 12.9 (L) 13.0 - 16.0 g/dL    HCT 39.4 36.0 - 48.0 %    MCV 95.2 74.0 - 97.0 FL    MCH 31.2 24.0 - 34.0 PG    MCHC 32.7 31.0 - 37.0 g/dL    RDW 13.4 11.6 - 14.5 %    PLATELET 194 135 - 420 K/uL    MPV 10.7 9.2 - 11.8 FL   LIPID PANEL   Result Value Ref Range    LIPID PROFILE          Cholesterol, total 134 <200 MG/DL    Triglyceride 86 <150 MG/DL    HDL Cholesterol 50 40 - 60 MG/DL    LDL, calculated 66.8 0 - 100 MG/DL    VLDL, calculated 17.2 MG/DL    CHOL/HDL Ratio 2.7 0 - 5.0     HEMOGLOBIN A1C WITH EAG   Result Value Ref Range    Hemoglobin A1c 5.5 4.2 - 5.6 %    Est. average glucose 111 mg/dL         We reviewed the patient's labs from the last several visits to point out trends in the numbers      Patient Active Problem List   Diagnosis Code   ??? CAD, history of inferior wall myocardial infarction/status post stenting of the RCA in September 2003/status post CABG X3 in January 2006/EF 45%. I25.10   ??? PVC's    ??? BPH with obstruction/lower urinary tract symptoms N40.1, N13.8   ??? Elevated prostate specific antigen (PSA) R97.20   ??? Hyperlipidemia E78.5   ??? Primary hypertension I10   ??? Impaired fasting glucose R73.01   ??? CKD (chronic kidney disease) stage 3, GFR 30-59 ml/min N18.30     Assessment and plan:  1. CHD.  F/U Dr Boyd Kerbs  2. BPH/LUTS, elev psa.  Will establish with Dr Darreld Mclean group  3. HLP.  Continue current regimen.  We did discuss lovaza vs vascepa, at this time it's a cost issues  4. HTN.  Will change diuretic to amlo as below, sfx discussed  5. IFG.  Lifestyle and dietary measures. Weight loss would be ideal  6. CKD.  Trial of stopping diuretic and changing to amlo.  Recheck 3 mos  7. Cologuard rec after discussing pros and cons in comparison to colonoscopy        RTC 5/21        Instructions above were handwritten on the lab results sheet that I gave the patient today    Above conditions discussed at length and patient vocalized understanding.   All questions answered to patient satisfaction    TOTAL time 40 min spent of which at least 30 min was on counseling, answering questions and coordination of care

## 2019-03-01 LAB — FECAL DNA COLORECTAL CANCER SCREENING (COLOGUARD): FIT-DNA (Cologuard): POSITIVE — AB

## 2019-03-06 ENCOUNTER — Telehealth

## 2019-03-06 NOTE — Telephone Encounter (Signed)
pls call    cologuard +  Roughly 25% chance he has cancer or advance precancer  sched colo GLST pls -referral sent

## 2019-03-06 NOTE — Telephone Encounter (Signed)
Pt aware of message below and verbalized understanding. No further questions or concerns from pt at this time.

## 2019-03-19 MED ORDER — OMEGA-3 ACID ETHYL ESTERS 1 GRAM CAP
1 gram | ORAL_CAPSULE | Freq: Every day | ORAL | 3 refills | Status: DC
Start: 2019-03-19 — End: 2019-10-31

## 2019-03-26 ENCOUNTER — Encounter: Attending: Physician Assistant | Primary: Internal Medicine

## 2019-03-26 NOTE — Progress Notes (Deleted)
Raymond [name]  July 14, 1945        {No Diagnosis Found}    Assessment and Plan:  UA today ***  DRE today *** grams, anodular    1. Elevated PSA   Reviewed last PSA 02/10/19:  5.3 ng/mL   DRE today *** grams, anodular     Discussed indication for prostate biopsy to evaluate for malignancy   Plan for TRUS biopsy. R/Bs discussed. All questions answered.    Rx for Levaquin '750mg'$  #1 provided for pre-procedure.     RTO *** for prostate biopsy    2. History of schwannoma     BMI ***    Discussion  We have discussed PSA elevation today in detail. We reviewed the implications of an elevated PSA and the uncertainty surrounding it. In general, a man's PSA increases with age and is produced by both normal and cancerous prostate tissue.     The PSA test can detect high levels of PSA that may indicate the presence of prostate cancer. However, many other benign conditions, such as an enlarged or inflamed prostate, can also increase PSA levels. Although the PSA test can detect high levels of PSA in the blood, the test doesn't provide precise diagnostic information about the condition of the prostate.  The PSA test is only one tool used to screen for early signs of prostate cancer.      The natural history of prostate cancer and ongoing controversy regarding screening and potential treatment outcomes of prostate cancer has been discussed with the patient. The meaning of a false positive PSA and a false negative PSA has been discussed. He indicates understanding of the limitations of this screening test.            No chief complaint on file.        HISTORY OF PRESENT ILLNESS:     Raymond [name] is a 74 y.o. male who presents today in consultation for an elevated PSA of 5.3 ng/mL found on 02/10/19 and has been referred by Albin Felling, MD.      Today, the patient is doing well.   No back or bone pain.   No unintentional weight lose.   Good appetite.     Patient denies any bothersome urinary symptoms at baseline.   Denies gross  hematuria, dysuria, frequency, urgency, incontinence, or straining to void.     Family history of prostate cancer: {YES/NO:18482::"YES"}  Personal history of Radiation exposure: {YES/NO:18482::"YES"}  History of tobacco use: {YES/NO:18482::"YES"}           AUA-IPSS:  No flowsheet data found.    AUA Assessment Score:   ;    AUA Bother Rating:      Past Medical History:   Diagnosis Date   ??? 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 BPH with obstruction/lower urinary tract symptoms    ??? CABG x 3 02/2004   ??? Cardiac cath 02/29/2004    oRCA 100%.  LM patent.  p/mLAD 85%.  D1 90%.  D2 90%.  CX 45%.  LVEDP 12 mmHg.  EF 45%.  CABG recommended.   ??? Cardiac nuclear imaging test, mod risk 04/11/2013    Intermediate risk.  Mild-mod basal inferior infarction w/mild-mod peri-infarct ischemia in mid inferior, prox & mid inferolateral walls.  EF unreadable due to gating error.  Neg EKG on max EST.  Ex time 6 min.     ??? CKD (chronic kidney disease) stage 3, GFR 30-59  ml/min    ??? Coronary artery disease     IMI w stent of RCA 9/03; s/p CABG X3 1/16   ??? Dyslipidemia    ??? Elevated prostate specific antigen (PSA)     Dr Cristina Gong   ??? H/O echocardiogram     LVE.  EF 45-50%.  Inferior, inferolateral WMA (10/07)   ??? Hypertension    ??? IFG (impaired fasting glucose)    ??? Microscopic hematuria    ??? Pleural effusion on right    ??? Positive colorectal cancer screening using Cologuard test 02/2019   ??? Right renal cyst    ??? 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 Skin cancer     SCC   ??? Ventricular tachycardia, nonsustained ([place])        Past Surgical History:   Procedure Laterality Date   ??? HX CORONARY ARTERY BYPASS GRAFT  03/03/04    LIMA to the LAD. Sequential SVG to the diagonal and obtuse marginal branch   ??? HX CORONARY STENT PLACEMENT  10/12/01    RCA stented using 3.5 x 13 mm Express 2 stent   ??? HX OTHER SURGICAL  2014    s/p resection neurofibroma RLE   ??? HX TONSILLECTOMY           Family History   Problem Relation Age of Onset   ??? Heart Disease Father         x3 heart bypass   ??? Arthritis-osteo Mother    ??? Hypertension Sister        Current Outpatient Medications   Medication Sig Dispense Refill   ??? omega-3 acid ethyl esters (LOVAZA) 1 gram capsule Take 1 Cap by mouth daily. 90 Cap 3   ??? amLODIPine (NORVASC) 2.5 mg tablet Take 1 Tab by mouth daily. 30 Tab 5   ??? carvediloL (COREG) 6.25 mg tablet Take 1 Tab by mouth two (2) times daily (with meals). 180 Tab 0   ??? simvastatin (ZOCOR) 40 mg tablet Take 1 Tab by mouth nightly. 90 Tab 3   ??? losartan (COZAAR) 50 mg tablet TAKE 1 TABLET BY MOUTH TWICE DAILY 60 Tab 11   ??? triamcinolone (NASACORT AQ) 55 mcg nasal inhaler INSTILL 2 SPRAYS INTO EACH NOSTRIL DAILY 17 g 8   ??? aspirin 81 mg tablet Take 81 mg by mouth daily.     ??? MULTIVITAMIN PO Take 1 Tab by mouth daily.         Allergies:  Allergies   Allergen Reactions   ??? Ace Inhibitors Unknown (comments) and Cough     Intolerance b/c of dry coughing   ??? Bee Sting [Sting, Bee] Hives       Social History     Socioeconomic History   ??? Marital status: MARRIED     Spouse name: Not on file   ??? Number of children: Not on file   ??? Years of education: Not on file   ??? Highest education level: Not on file   Occupational History   ??? Not on file   Social Needs   ??? Financial resource strain: Not on file   ??? Food insecurity     Worry: Not on file     Inability: Not on file   ??? Transportation needs     Medical: Not on file     Non-medical: Not on file   Tobacco Use   ???  Smoking status: Never Smoker   ??? Smokeless tobacco: Never Used   Substance and Sexual Activity   ??? Alcohol use: Yes     Comment: social   ??? Drug use: No   ??? Sexual activity: Not on file   Lifestyle   ??? Physical activity     Days per week: Not on file     Minutes per session: Not on file   ??? Stress: Not on file   Relationships   ??? Social Product manager on phone: Not on file     Gets together: Not on file     Attends religious service: Not on  file     Active member of club or organization: Not on file     Attends meetings of clubs or organizations: Not on file     Relationship status: Not on file   ??? Intimate partner violence     Fear of current or ex partner: Not on file     Emotionally abused: Not on file     Physically abused: Not on file     Forced sexual activity: Not on file   Other Topics Concern   ??? Not on file   Social History Narrative   ??? Not on file         Review of Systems  Constitutional: Fever:   Skin: Rash:   HEENT: Hearing difficulty:   Eyes: Blurred vision:   Cardiovascular: Chest pain:   Respiratory: Shortness of breath:   Gastrointestinal: Nausea/vomiting:   Musculoskeletal: Back pain:   Neurological: Weakness:   Psychological: Memory loss:   Comments/additional findings:       PHYSCIAL EXAMINATION:     There were no vitals taken for this visit.  Constitutional: WDWN, Pleasant and appropriate affect, No acute distress.    CV:  No peripheral swelling noted  Respiratory: No respiratory distress or difficulties  Abdomen:  No abdominal masses or tenderness.  No CVA tenderness. No hernias noted.   GU Male:    AST:[id number] normal to visual inspection, no erythema or irritation, Sphincter with good tone, Rectum with no hemorrhoids, fissures or masses.  Prostate is *** grams. Anodular and smooth.   SCROTUM:  No scrotal rash or lesions noticed.  Normal bilateral testes and epididymis.   PENIS: Urethral meatus normal in location and size. No urethral discharge.  Skin: No jaundice.    Neuro/Psych:  Alert and oriented x 3. Affect appropriate.   Lymphatic:   No enlarged inguinal lymph nodes.    LE: No edema    REVIEW OF LABS AND IMAGING:      Results for orders placed or performed during the hospital encounter of 02/10/19   PSA SCREENING (SCREENING)   Result Value Ref Range    Prostate Specific Ag 5.3 (H) 0.0 - 4.0 ng/mL   METABOLIC PANEL, COMPREHENSIVE   Result Value Ref Range    Sodium 143 136 - 145 mmol/L    Potassium 3.6 3.5 - 5.5 mmol/L     Chloride 108 100 - 111 mmol/L    CO2 29 21 - 32 mmol/L    Anion gap 6 3.0 - 18 mmol/L    Glucose 95 74 - 99 mg/dL    BUN 22 (H) 7.0 - 18 MG/DL    Creatinine 1.74 (H) 0.6 - 1.3 MG/DL    BUN/Creatinine ratio 13 12 - 20      GFR est AA 47 (L) >60 ml/min/1.48m    GFR est non-AA  39 (L) >60 ml/min/1.46m    Calcium 8.8 8.5 - 10.1 MG/DL    Bilirubin, total 0.6 0.2 - 1.0 MG/DL    ALT (SGPT) 25 16 - 61 U/L    AST (SGOT) 15 10 - 38 U/L    Alk. phosphatase 63 45 - 117 U/L    Protein, total 6.6 6.4 - 8.2 g/dL    Albumin 3.6 3.4 - 5.0 g/dL    Globulin 3.0 2.0 - 4.0 g/dL    A-G Ratio 1.2 0.8 - 1.7     CBC W/O DIFF   Result Value Ref Range    WBC 6.0 4.6 - 13.2 K/uL    RBC 4.14 (L) 4.70 - 5.50 M/uL    HGB 12.9 (L) 13.0 - 16.0 g/dL    HCT 39.4 36.0 - 48.0 %    MCV 95.2 74.0 - 97.0 FL    MCH 31.2 24.0 - 34.0 PG    MCHC 32.7 31.0 - 37.0 g/dL    RDW 13.4 11.6 - 14.5 %    PLATELET 194 135 - 420 K/uL    MPV 10.7 9.2 - 11.8 FL   LIPID PANEL   Result Value Ref Range    LIPID PROFILE          Cholesterol, total 134 <200 MG/DL    Triglyceride 86 <150 MG/DL    HDL Cholesterol 50 40 - 60 MG/DL    LDL, calculated 66.8 0 - 100 MG/DL    VLDL, calculated 17.2 MG/DL    CHOL/HDL Ratio 2.7 0 - 5.0     HEMOGLOBIN A1C WITH EAG   Result Value Ref Range    Hemoglobin A1c 5.5 4.2 - 5.6 %    Est. average glucose 111 mg/dL         Lab Results   Component Value Date/Time    Prostate Specific Ag 5.3 (H) 02/10/2019 11:00 AM    Prostate Specific Ag 2.9 09/10/2017 09:35 AM    Prostate Specific Ag 3.0 09/11/2016 09:55 AM         A copy of today's office visit with all pertinent imaging results and labs were sent to the referring physician,Dumaran, RMicheal Likens MD      CGeorgia Duff PA   CGeorgia Duff PA-C  [facility]  [place] [address] P: [phone]  F: [phone]

## 2019-04-04 MED ORDER — LOSARTAN 50 MG TAB
50 mg | ORAL_TABLET | ORAL | 11 refills | Status: DC
Start: 2019-04-04 — End: 2020-03-22

## 2019-04-10 ENCOUNTER — Encounter: Attending: Interventional Cardiology | Primary: Internal Medicine

## 2019-04-14 MED ORDER — CARVEDILOL 6.25 MG TAB
6.25 mg | ORAL_TABLET | Freq: Two times a day (BID) | ORAL | 3 refills | Status: DC
Start: 2019-04-14 — End: 2019-06-23

## 2019-04-14 NOTE — Telephone Encounter (Signed)
Pt says he called it into pharmacy a week ago. I don't show request

## 2019-04-14 NOTE — Telephone Encounter (Signed)
Last Visit: 02/17/19 with MD Tyler Pita  Next Appointment: 05/19/19 with MD Tyler Pita  Previous Refill Encounter(s): 01/13/19 #180    Requested Prescriptions     Pending Prescriptions Disp Refills   ??? carvediloL (COREG) 6.25 mg tablet 180 Tab 3     Sig: Take 1 Tab by mouth two (2) times daily (with meals).

## 2019-04-15 ENCOUNTER — Ambulatory Visit: Admit: 2019-04-15 | Discharge: 2019-04-15 | Attending: Physician Assistant | Primary: Internal Medicine

## 2019-04-15 ENCOUNTER — Ambulatory Visit: Attending: Physician Assistant | Primary: Internal Medicine

## 2019-04-15 DIAGNOSIS — R972 Elevated prostate specific antigen [PSA]: Secondary | ICD-10-CM

## 2019-04-15 LAB — AMB POC URINALYSIS DIP STICK AUTO W/O MICRO
Bilirubin (UA POC): NEGATIVE
Bilirubin, Urine, POC: NEGATIVE
Glucose (UA POC): NEGATIVE
Glucose, Urine, POC: NEGATIVE
Ketones (UA POC): NEGATIVE
Ketones, Urine, POC: NEGATIVE
Nitrite, Urine, POC: NEGATIVE
Nitrites (UA POC): NEGATIVE
Protein (UA POC): NEGATIVE
Protein, Urine, POC: NEGATIVE
Specific Gravity, Urine, POC: 1.025 NA (ref 1.001–1.035)
Specific gravity (UA POC): 1.025 (ref 1.001–1.035)
Urobilinogen (UA POC): 0.2 (ref 0.2–1)
Urobilinogen, POC: 0.2 (ref 0.2–1)
pH (UA POC): 5 (ref 4.6–8.0)
pH, Urine, POC: 5 NA (ref 4.6–8.0)

## 2019-04-15 LAB — AMB POC PVR, MEAS,POST-VOID RES,US,NON-IMAGING
PVR POC: 0 cc
PVR: 0 cc

## 2019-04-15 NOTE — Progress Notes (Signed)
PSA decreasing, but still elevated. Can we schedule him in 3 months on the nurses schedule for repeat PSA?

## 2019-04-15 NOTE — Progress Notes (Signed)
Raymond [name]  06/21/1945          ICD-10-CM ICD-9-CM    1. Elevated PSA  R97.20 790.93 COLLECTION VENOUS BLOOD,VENIPUNCTURE      PROSTATE SPECIFIC ANTIGEN, TOTAL (PSA)   2. BPH without obstruction/lower urinary tract symptoms  N40.0 600.00 AMB POC PVR, MEAS,POST-VOID RES,US,NON-IMAGING      AMB POC URINALYSIS DIP STICK AUTO W/O MICRO      COLLECTION VENOUS BLOOD,VENIPUNCTURE      PROSTATE SPECIFIC ANTIGEN, TOTAL (PSA)       Assessment and Plan:  UA today 3+ blood, trace leuks  DRE today 50 grams, anodular  PVR today 0 cc    1. BPH wo LUTS   No medical therapy    PVR today: 0 cc     2. Elevated PSA   Reviewed PSA from 02/10/2019 - 5.3ng/mL   DRE today 50 grams   PSA drawn today, will inform      Patient has undulating PSA. Draw PSA today. If PSA still elevated, schedule prostate biopsy.    If PSA normal, RTC as needed        BMI 28.21. Patient's BMI is out of the normal parameters.  Information about BMI was given and patient was advised to follow-up with their PCP for further management.      Discussion  We discussed the options for management of LUTS/Benign Prostatic Hyperplasia;  including watchful waiting, over the counter supplements, medical therapy, minimally invasive therapies, laser therapy and transurethral resection/coagulative therapies.      Watchful Waiting: risks and benefits of this approach were discussed including the fact that BPH is usually a progressive disease and can lead to infection, bladder stones, hematuria, incontinence, bladder damage and kidney damage but not limited to these conditions. The need for on-going monitoring, including PSA, DRE, Flow, Bladder scan, and prostate ultrasound.     Alternative therapies: risks and benefits of medical therapy using saw palmetto, prostate formulas and various over the counter therapies.     Minimally Invasive Therapies: risks and benefits of various therapies including but not limited to TransUrethral Microwave Therapy (TUMT), Injection therapies and others.    Laser Therapies: risks and benefits of GreenLight Laser Photoselective Vaporization of the Prostate (PVP), Holmium Laser Ablation of the Prostate (HoLAP), and Holmium Laser Enucleation of the Prostate (HoLEP).     Transurethral resection/Coagulative Therapies and PlasmaButton procedure: risks and benefits of these therapies reviewed as well.     The risks and benefits of each option were reviewed in great detail. All questions answered.      Chief Complaint   Patient presents with   ??? Benign Prostatic Hypertrophy   ??? (LUTS) Lower Urinary Tract Symptoms   ??? Elevated PSA         HISTORY OF PRESENT ILLNESS:     Raymond [name] is a 74 y.o. male who presents today in consultation for BPH w/Luts and has been referred by Raymond Hind, MD.  His most recent PSA is 5.3ng/mL from 02/10/2019. History of undulating PSA.    Today, the patient is doing well.   He is not currently taking any GU  Medications, and has no urinary complaints.     Denies flank pain, gross hematuria, dysuria, asymptomatic for infection. No f/c/n/v.     No family history of prostate, bladder or kidney cancer  Never smoker  Worked in Clinical research associate in [employer]        No flowsheet data found.      AUA  Assessment Score:   ;    AUA Bother Rating:           Past Medical History:   Diagnosis Date   ??? 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 BPH with obstruction/lower urinary tract symptoms    ??? CABG x 3 02/2004   ??? Cardiac cath 02/29/2004    oRCA 100%.  LM patent.  p/mLAD 85%.  D1 90%.  D2 90%.  CX 45%.  LVEDP 12 mmHg.  EF 45%.  CABG recommended.   ??? Cardiac nuclear imaging test, mod risk 04/11/2013     Intermediate risk.  Mild-mod basal inferior infarction w/mild-mod peri-infarct ischemia in mid inferior, prox & mid inferolateral walls.  EF unreadable due to gating error.  Neg EKG on max EST.  Ex time 6 min.     ??? CKD (chronic kidney disease) stage 3, GFR 30-59 ml/min    ??? Coronary artery disease     IMI w stent of RCA 9/03; s/p CABG X3 1/16   ??? Dyslipidemia    ??? Elevated prostate specific antigen (PSA)     Dr Cristina Gong   ??? H/O echocardiogram     LVE.  EF 45-50%.  Inferior, inferolateral WMA (10/07)   ??? Hypertension    ??? IFG (impaired fasting glucose)    ??? Microscopic hematuria    ??? Pleural effusion on right    ??? Positive colorectal cancer screening using Cologuard test 02/2019   ??? Right renal cyst    ??? 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 Skin cancer     SCC   ??? Ventricular tachycardia, nonsustained ([place])        Past Surgical History:   Procedure Laterality Date   ??? HX CORONARY ARTERY BYPASS GRAFT  03/03/04    LIMA to the LAD. Sequential SVG to the diagonal and obtuse marginal branch   ??? HX CORONARY STENT PLACEMENT  10/12/01    RCA stented using 3.5 x 13 mm Express 2 stent   ??? HX OTHER SURGICAL  2014    s/p resection neurofibroma RLE   ??? HX TONSILLECTOMY          Family History   Problem Relation Age of Onset   ??? Heart Disease Father         x3 heart bypass   ??? Arthritis-osteo Mother    ??? Hypertension Sister        Current Outpatient Medications   Medication Sig Dispense Refill   ??? carvediloL (COREG) 6.25 mg tablet Take 1 Tab by mouth two (2) times daily (with meals). 180 Tab 3   ??? losartan (COZAAR) 50 mg tablet TAKE 1 TABLET BY MOUTH TWICE DAILY 60 Tab 11   ??? omega-3 acid ethyl esters (LOVAZA) 1 gram capsule Take 1 Cap by mouth daily. 90 Cap 3   ??? amLODIPine (NORVASC) 2.5 mg tablet Take 1 Tab by mouth daily. 30 Tab 5   ??? simvastatin (ZOCOR) 40 mg tablet Take 1 Tab by mouth nightly. 90 Tab 3    ??? triamcinolone (NASACORT AQ) 55 mcg nasal inhaler INSTILL 2 SPRAYS INTO EACH NOSTRIL DAILY 17 g 8   ??? aspirin 81 mg tablet Take 81 mg by mouth daily.     ??? MULTIVITAMIN PO Take 1 Tab by mouth daily.         Allergies:  Allergies   Allergen Reactions   ??? Ace Inhibitors Unknown (comments) and Cough     Intolerance b/c of dry coughing   ??? Bee Sting [Sting, Bee] Hives       Social History     Socioeconomic History   ??? Marital status: MARRIED     Spouse name: Not on file   ??? Number of children: Not on file   ??? Years of education: Not on file   ??? Highest education level: Not on file   Occupational History   ??? Not on file   Social Needs   ??? Financial resource strain: Not on file   ??? Food insecurity     Worry: Not on file     Inability: Not on file   ??? Transportation needs     Medical: Not on file     Non-medical: Not on file   Tobacco Use   ??? Smoking status: Never Smoker   ??? Smokeless tobacco: Never Used   Substance and Sexual Activity   ??? Alcohol use: Yes     Comment: social   ??? Drug use: No   ??? Sexual activity: Not on file   Lifestyle   ??? Physical activity     Days per week: Not on file     Minutes per session: Not on file   ??? Stress: Not on file   Relationships   ??? Social Wellsite geologist on phone: Not on file     Gets together: Not on file     Attends religious service: Not on file     Active member of club or organization: Not on file     Attends meetings of clubs or organizations: Not on file     Relationship status: Not on file   ??? Intimate partner violence     Fear of current or ex partner: Not on file     Emotionally abused: Not on file     Physically abused: Not on file     Forced sexual activity: Not on file   Other Topics Concern   ??? Not on file   Social History Narrative   ??? Not on file         Review of Systems  Constitutional: Fever: No  Skin: Rash: No  HEENT: Hearing difficulty: No  Eyes: Blurred vision: No  Cardiovascular: Chest pain: No  Respiratory: Shortness of breath: No   Gastrointestinal: Nausea/vomiting: No  Musculoskeletal: Back pain: No  Neurological: Weakness: No  Psychological: Memory loss: No  Comments/additional findings:       PHYSCIAL EXAMINATION:     Visit Vitals  Temp 97 ??F (36.1 ??C)   Ht 5\' 9"  (1.753 m)   Wt 191 lb (86.6 kg)   BMI 28.21 kg/m??     Constitutional: WDWN, Pleasant and appropriate affect, No acute distress.    CV:  No peripheral swelling noted  Respiratory: No respiratory distress or difficulties  Abdomen:  No abdominal masses or tenderness.  No CVA tenderness. No hernias noted.   GU Male:  04/15/19  06/15/19 normal to visual inspection, no erythema or irritation, Sphincter with good tone, Rectum with no hemorrhoids, fissures or masses.  Prostate is 50 grams. Anodular and smooth.   Skin: No jaundice.    Neuro/Psych:  Alert and oriented x 3. Affect appropriate.   LE: No edema    REVIEW OF LABS AND IMAGING:      Results for orders placed or performed in  visit on 04/15/19   AMB POC PVR, MEAS,POST-VOID RES,US,NON-IMAGING   Result Value Ref Range    PVR 0 cc   AMB POC URINALYSIS DIP STICK AUTO W/O MICRO   Result Value Ref Range    Color (UA POC) Yellow     Clarity (UA POC) Clear     Glucose (UA POC) Negative Negative    Bilirubin (UA POC) Negative Negative    Ketones (UA POC) Negative Negative    Specific gravity (UA POC) 1.025 1.001 - 1.035    Blood (UA POC) 3+ Negative    pH (UA POC) 5.0 4.6 - 8.0    Protein (UA POC) Negative Negative    Urobilinogen (UA POC) 0.2 mg/dL 0.2 - 1    Nitrites (UA POC) Negative Negative    Leukocyte esterase (UA POC) Trace Negative         Lab Results   Component Value Date/Time    Prostate Specific Ag 5.3 (H) 02/10/2019 11:00 AM    Prostate Specific Ag 2.9 09/10/2017 09:35 AM    Prostate Specific Ag 3.0 09/11/2016 09:55 AM         A copy of today's office visit with all pertinent imaging results and labs were sent to the referring physician,Dumaran, Murvin Donning, MD      London Sheer, PA     London Sheer, PA-C   [facility]   [address] [place], [address]  [place], [place] [address]  P: [phone]   F: [phone]      Medical documentation provided with the assistance of [place] Hall, medical scribe for London Sheer, [place] on 04/15/2019.

## 2019-04-15 NOTE — Progress Notes (Signed)
Progress Notes by London Sheer, PA at 04/15/19 1520                Author: London Sheer, PA  Service: --  Author Type: Physician Assistant       Filed: 04/15/19 2111  Encounter Date: 04/15/2019  Status: Signed          Editor: London Sheer, PA (Physician Assistant)                                    Raymond [name]   12/17/1945                      ICD-10-CM  ICD-9-CM             1.  Elevated PSA   R97.20  790.93  COLLECTION VENOUS BLOOD,VENIPUNCTURE                PROSTATE SPECIFIC ANTIGEN, TOTAL (PSA)           2.  BPH without obstruction/lower urinary tract symptoms   N40.0  600.00  AMB POC PVR, MEAS,POST-VOID RES,US,NON-IMAGING                AMB POC URINALYSIS DIP STICK AUTO W/O MICRO           COLLECTION VENOUS BLOOD,VENIPUNCTURE                PROSTATE SPECIFIC ANTIGEN, TOTAL (PSA)           Assessment and Plan:   UA today 3+ blood, trace leuks   DRE today 50 grams, anodular   PVR today 0 cc      1. BPH wo LUTS    No medical therapy     PVR today: 0 cc       2. Elevated PSA    Reviewed PSA from 02/10/2019 - 5.3ng/mL    DRE today 50 grams    PSA drawn today, will inform         Patient has undulating PSA. Draw PSA today. If PSA still elevated, schedule prostate biopsy.      If PSA normal, RTC as needed            BMI 28.21. Patient's BMI is out of the normal parameters.  Information about BMI was given and patient was advised to follow-up with their PCP for further management.         Discussion   We discussed the options for management of LUTS/Benign Prostatic Hyperplasia;  including watchful waiting, over the counter supplements, medical therapy, minimally invasive therapies, laser therapy  and transurethral resection/coagulative therapies.        Watchful Waiting: risks and benefits of this approach were discussed including the fact that BPH is usually a progressive disease and can lead to infection, bladder stones, hematuria, incontinence, bladder damage and kidney damage but not limited to  these  conditions. The need for on-going monitoring, including PSA, DRE, Flow, Bladder scan, and prostate ultrasound.       Alternative therapies: risks and benefits of medical therapy using saw palmetto, prostate formulas and various over the counter therapies.      Minimally Invasive Therapies: risks and benefits of various therapies including but not limited to TransUrethral Microwave Therapy (TUMT), Injection therapies and others.      Laser Therapies: risks and benefits of GreenLight Laser Photoselective Vaporization of the Prostate (PVP), Holmium Laser  Ablation of the Prostate (HoLAP), and Holmium Laser Enucleation of the Prostate (HoLEP).       Transurethral resection/Coagulative Therapies and PlasmaButton procedure: risks and benefits of these therapies reviewed as well.       The risks and benefits of each option were reviewed in great detail. All questions answered.           Chief Complaint       Patient presents with        ?  Benign Prostatic Hypertrophy     ?  (LUTS) Lower Urinary Tract Symptoms        ?  Elevated PSA              HISTORY OF PRESENT ILLNESS:       Raymond [name] is a 74 y.o.  male who presents today in consultation for BPH w/Luts and has been referred by Bennie Hind, MD .  His most recent PSA is 5.3ng/mL from 02/10/2019. History of undulating PSA.      Today, the patient is doing well.    He is not currently taking any GU  Medications, and has no urinary complaints.       Denies flank pain, gross hematuria, dysuria, asymptomatic for infection. No f/c/n/v.       No family history of prostate, bladder or kidney cancer   Never smoker   Worked in Clinical research associate in [employer]            No flowsheet data found.         AUA Assessment Score:    ;     AUA Bother Rating:                 Past Medical History:        Diagnosis  Date         ?  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  BPH with obstruction/lower urinary tract symptoms       ?  CABG x 3  02/2004     ?   Cardiac cath  02/29/2004          oRCA 100%.  LM patent.  p/mLAD 85%.  D1 90%.  D2 90%.  CX 45%.  LVEDP 12 mmHg.  EF 45%.  CABG recommended.         ?  Cardiac nuclear imaging test, mod risk  04/11/2013          Intermediate risk.  Mild-mod basal inferior infarction w/mild-mod peri-infarct ischemia in mid inferior, prox & mid inferolateral walls.  EF unreadable  due to gating error.  Neg EKG on max EST.  Ex time 6 min.           ?  CKD (chronic kidney disease) stage 3, GFR 30-59 ml/min       ?  Coronary artery disease            IMI w stent of RCA 9/03; s/p CABG X3 1/16         ?  Dyslipidemia       ?  Elevated prostate specific antigen (PSA)            Dr Cristy Friedlander         ?  H/O echocardiogram            LVE.  EF 45-50%.  Inferior, inferolateral WMA (  10/07)         ?  Hypertension       ?  IFG (impaired fasting glucose)       ?  Microscopic hematuria       ?  Pleural effusion on right       ?  Positive colorectal cancer screening using Cologuard test  02/2019     ?  Right renal cyst       ?  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  Skin cancer            SCC         ?  Ventricular tachycardia, nonsustained [facility])               Past Surgical History:         Procedure  Laterality  Date          ?  HX CORONARY ARTERY BYPASS GRAFT    03/03/04          LIMA to the LAD. Sequential SVG to the diagonal and obtuse marginal branch          ?  HX CORONARY STENT PLACEMENT    10/12/01          RCA stented using 3.5 x 13 mm Express 2 stent          ?  HX OTHER SURGICAL    2014          s/p resection neurofibroma RLE          ?  HX TONSILLECTOMY                  Family History         Problem  Relation  Age of Onset          ?  Heart Disease  Father                x3 heart bypass          ?  Arthritis-osteo  Mother            ?  Hypertension  Sister               Current Outpatient Medications          Medication  Sig  Dispense  Refill           ?  carvediloL (COREG) 6.25  mg tablet  Take 1 Tab by mouth two (2) times daily (with meals).  180 Tab  3     ?  losartan (COZAAR) 50 mg tablet  TAKE 1 TABLET BY MOUTH TWICE DAILY  60 Tab  11     ?  omega-3 acid ethyl esters (LOVAZA) 1 gram capsule  Take 1 Cap by mouth daily.  90 Cap  3     ?  amLODIPine (NORVASC) 2.5 mg tablet  Take 1 Tab by mouth daily.  30 Tab  5     ?  simvastatin (ZOCOR) 40 mg tablet  Take 1 Tab by mouth nightly.  90 Tab  3     ?  triamcinolone (NASACORT AQ) 55 mcg nasal inhaler  INSTILL 2 SPRAYS INTO EACH NOSTRIL DAILY  17 g  8     ?  aspirin 81 mg tablet  Take  81 mg by mouth daily.               ?  MULTIVITAMIN PO  Take 1 Tab by mouth daily.               Allergies:     Allergies        Allergen  Reactions         ?  Ace Inhibitors  Unknown (comments) and Cough             Intolerance b/c of dry coughing         ?  Bee Sting [Sting, Bee]  Hives             Social History          Socioeconomic History         ?  Marital status:  MARRIED              Spouse name:  Not on file         ?  Number of children:  Not on file     ?  Years of education:  Not on file     ?  Highest education level:  Not on file       Occupational History        ?  Not on file       Social Needs         ?  Financial resource strain:  Not on file        ?  Food insecurity              Worry:  Not on file         Inability:  Not on file        ?  Transportation needs              Medical:  Not on file         Non-medical:  Not on file       Tobacco Use         ?  Smoking status:  Never Smoker     ?  Smokeless tobacco:  Never Used       Substance and Sexual Activity         ?  Alcohol use:  Yes             Comment: social         ?  Drug use:  No     ?  Sexual activity:  Not on file       Lifestyle        ?  Physical activity              Days per week:  Not on file         Minutes per session:  Not on file         ?  Stress:  Not on file       Relationships        ?  Social Health visitor on phone:  Not on file         Gets together:   Not on file         Attends religious service:  Not on file         Active member of club or organization:  Not on file  Attends meetings of clubs or organizations:  Not on file         Relationship status:  Not on file        ?  Intimate partner violence              Fear of current or ex partner:  Not on file         Emotionally abused:  Not on file         Physically abused:  Not on file         Forced sexual activity:  Not on file        Other Topics  Concern        ?  Not on file       Social History Narrative        ?  Not on file              Review of Systems   Constitutional: Fever: No   Skin: Rash: No   HEENT: Hearing difficulty: No   Eyes: Blurred vision: No   Cardiovascular: Chest pain: No   Respiratory: Shortness of breath: No   Gastrointestinal: Nausea/vomiting: No   Musculoskeletal: Back pain: No   Neurological: Weakness: No   Psychological: Memory loss: No   Comments/additional findings:          PHYSCIAL EXAMINATION:       Visit Vitals      Temp  97 ??F (36.1 ??C)     Ht   (1.753 m)     Wt  191 lb (86.6 kg)        BMI  28.21 kg/m??        Constitutional: WDWN, Pleasant and appropriate affect, No acute distress.     CV:  No peripheral swelling noted   Respiratory: No respiratory distress or difficulties   Abdomen:  No abdominal masses or tenderness.  No CVA tenderness. No hernias noted.    GU Male:  04/15/19   WUX:[id number] normal to visual inspection, no erythema or irritation, Sphincter with good tone, Rectum with no hemorrhoids,  fissures or masses.  Prostate is 50 grams. Anodular and smooth.    Skin: No jaundice.     Neuro/Psych:  Alert and oriented x 3. Affect appropriate.    LE: No edema      REVIEW OF LABS AND IMAGING:          Results for orders placed or performed in visit on 04/15/19     AMB POC PVR, MEAS,POST-VOID RES,US,NON-IMAGING         Result  Value  Ref Range            PVR  0  cc       AMB POC URINALYSIS DIP STICK AUTO W/O MICRO         Result  Value  Ref Range             Color (UA POC)  Yellow         Clarity (UA POC)  Clear         Glucose (UA POC)  Negative  Negative       Bilirubin (UA POC)  Negative  Negative       Ketones (UA POC)  Negative  Negative       Specific gravity (UA POC)  1.025  1.001 - 1.035       Blood (UA POC)  3+  Negative       pH (  UA POC)  5.0  4.6 - 8.0       Protein (UA POC)  Negative  Negative       Urobilinogen (UA POC)  0.2 mg/dL  0.2 - 1       Nitrites (UA POC)  Negative  Negative            Leukocyte esterase (UA POC)  Trace  Negative                Lab Results         Component  Value  Date/Time            Prostate Specific Ag  5.3 (H)  02/10/2019 11:00 AM       Prostate Specific Ag  2.9  09/10/2017 09:35 AM            Prostate Specific Ag  3.0  09/11/2016 09:55 AM              A copy of today's office visit with all pertinent imaging results and labs were sent to the referring physician,Dumaran, Murvin Donningaymund S, MD         London Sheerhristine Alyzabeth Pontillo, PA       London Sheerhristine Cataleyah Colborn, PA-C   [facility]    [address], [address]   [place], [place] [id number]   P: [phone]    F: [phone]         Medical documentation provided with the assistance of [place]Jordan Hall, medical scribe for London SheerChristine Thai Hemrick, [place] on  04/15/2019.

## 2019-04-15 NOTE — Progress Notes (Signed)
PSA decreasing, but still elevated. Can we schedule him in 3 months on the nurses schedule for repeat PSA?

## 2019-04-16 LAB — PROSTATE SPECIFIC ANTIGEN, TOTAL (PSA)
PSA: 4.87 ng/mL — ABNORMAL HIGH (ref 0.00–4.00)
Prostate Specific Ag: 4.87 ng/mL — ABNORMAL HIGH (ref 0.00–4.00)

## 2019-04-17 ENCOUNTER — Ambulatory Visit
Admit: 2019-04-17 | Discharge: 2019-04-17 | Payer: BLUE CROSS/BLUE SHIELD | Attending: Interventional Cardiology | Primary: Internal Medicine

## 2019-04-17 ENCOUNTER — Ambulatory Visit: Attending: Interventional Cardiology | Primary: Internal Medicine

## 2019-04-17 DIAGNOSIS — R001 Bradycardia, unspecified: Secondary | ICD-10-CM

## 2019-04-17 NOTE — Progress Notes (Signed)
Raymond [name] presents today for   Chief Complaint   Patient presents with   ??? Follow-up     6 month follow up        Raymond [name] preferred language for health care discussion is english/other.    Is someone accompanying this pt? no    Is the patient using any DME equipment during OV? no    Depression Screening:  3 most recent PHQ Screens 02/17/2019   Little interest or pleasure in doing things Not at all   Feeling down, depressed, irritable, or hopeless Not at all   Total Score PHQ 2 0       Learning Assessment:  Learning Assessment 09/11/2013   PRIMARY LEARNER Patient   HIGHEST LEVEL OF EDUCATION - PRIMARY LEARNER  -   BARRIERS PRIMARY LEARNER -   CO-LEARNER CAREGIVER -   PRIMARY LANGUAGE ENGLISH   INTERPRETER NEED -   LEARNER PREFERENCE PRIMARY LISTENING   ANSWERED BY patient   RELATIONSHIP SELF       Abuse Screening:  Abuse Screening Questionnaire 02/17/2019   Do you ever feel afraid of your partner? -   Are you in a relationship with someone who physically or mentally threatens you? N   Is it safe for you to go home? Y       Fall Risk  Fall Risk Assessment, last 12 mths 02/17/2019   Able to walk? Yes   Fall in past 12 months? 0   Do you feel unsteady? 0   Are you worried about falling 0       Pt currently taking Anticoagulant therapy? ASA 81mg every day     Coordination of Care:  1. Have you been to the ER, urgent care clinic since your last visit? Hospitalized since your last visit? no    2. Have you seen or consulted any other health care providers outside of the [facility] since your last visit? Include any pap smears or colon screening. no

## 2019-04-17 NOTE — Progress Notes (Signed)
HISTORY OF PRESENT ILLNESS  Raymond [name] is a 74 y.o. male.    ASSESSMENT and PLAN    [name], previous Dr. Peggyann [name] patient, has history of CAD.  Back in January 2006, he presented with sinking feeling and nonsustained VT.  Ultimately, he underwent coronary artery bypass graft surgery with LIMA to LAD as well as sequential SVG to diagonal and OM branch.  Since that time, he has had nuclear scan in 2012 which showed EF of 55% with fixed inferior defect.  His nuclear scan in February 2018 showed inferior fixed defect without ischemia.  His EF was noted to be 52%.  Because of bradycardia, he did have Holter monitor in February 2018 which showed baseline sinus bradycardia without symptoms.  His minimum heart rate was 41 bpm.  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  He does have history hypertension, and hyperlipidemia.  He denies active tobacco use.  He has chronic renal disease with his last creatinine in July 2020 to be about 1.65 from baseline of 1.36.  Diuretic regimen was decreased at that time.    ??? CAD:    Symptomatically stable.  ??? BP:    Well controlled.  ??? Rhythm:    Asymptomatic sinus bradycardia.  ??? CHF:    Currently, there is no evidence of decompensated CHF noted.  ??? Weight:    His weight today is 194 pounds.  His baseline weight is 194 pounds.  ??? Cholesterol:  Target LDL<70.  Zocor 40.  ??? Anti-coagulation:  Remains on ASA.        He had Cologuard screening test performed.  This came back abnormal.  Is scheduled undergo colonoscopy by Dr. Silver Huguenin.  From cardiac standpoint, he should be able to tolerate this.  I will see him back in 6 months.  Thank you.      Encounter Diagnoses   Name Primary?   ??? Bradycardia Yes   ??? Atherosclerosis of native coronary artery of native heart without angina pectoris,CABGx3 2006,EF 45%    ??? Nonsustained ventricular tachycardia ([facility])    ??? Hypercholesteremia    ??? Renal  dysfunction      current treatment plan is effective, no change in therapy  lab results and schedule of future lab studies reviewed with patient  reviewed diet, exercise and weight control  cardiovascular risk and specific lipid/LDL goals reviewed  use of aspirin to prevent MI and TIA's discussed      HPI   Today, Raymond [name] has no complaints of chest pains, increased shortness of breath or decreased exercise capacity.  He recently had Cologuard for screening.  It came back abnormal.  He was referred to gastroenterologist.  He is scheduled undergo colonoscopy by Dr. Silver Huguenin within the next 3 weeks.  He denies any recent episodes of orthopnea, PND, palpitations or dizziness.    Review of Systems   Respiratory: Negative for shortness of breath.    Cardiovascular: Negative for chest pain, palpitations, orthopnea, claudication, leg swelling and PND.   All other systems reviewed and are negative.      Physical Exam  Vitals signs and nursing note reviewed.   Constitutional:       Appearance: Normal appearance.   HENT:      Head: Normocephalic.   Eyes:      Conjunctiva/sclera: Conjunctivae normal.   Neck:      Musculoskeletal: No  neck rigidity.   Cardiovascular:      Rate and Rhythm: Regular rhythm. Bradycardia present.   Pulmonary:      Breath sounds: Normal breath sounds.   Abdominal:      Palpations: Abdomen is soft.   Musculoskeletal:         General: No swelling.   Skin:     General: Skin is warm and dry.   Neurological:      General: No focal deficit present.      Mental Status: He is alert and oriented to person, place, and time.   Psychiatric:         Mood and Affect: Mood normal.         Behavior: Behavior normal.         PCP: Bennie Hind, MD    Past Medical History:   Diagnosis Date   ??? 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 BPH with obstruction/lower urinary tract symptoms    ??? CABG x 3 02/2004   ??? Cardiac cath 02/29/2004    oRCA 100%.  LM patent.  p/mLAD 85%.  D1 90%.  D2 90%.   CX 45%.  LVEDP 12 mmHg.  EF 45%.  CABG recommended.   ??? Cardiac nuclear imaging test, mod risk 04/11/2013    Intermediate risk.  Mild-mod basal inferior infarction w/mild-mod peri-infarct ischemia in mid inferior, prox & mid inferolateral walls.  EF unreadable due to gating error.  Neg EKG on max EST.  Ex time 6 min.     ??? CKD (chronic kidney disease) stage 3, GFR 30-59 ml/min    ??? Coronary artery disease     IMI w stent of RCA 9/03; s/p CABG X3 1/16   ??? Dyslipidemia    ??? Elevated prostate specific antigen (PSA)     Dr Cristy Friedlander   ??? H/O echocardiogram     LVE.  EF 45-50%.  Inferior, inferolateral WMA (10/07)   ??? Hypertension    ??? IFG (impaired fasting glucose)    ??? Microscopic hematuria    ??? Pleural effusion on right    ??? Positive colorectal cancer screening using Cologuard test 02/2019   ??? Right renal cyst    ??? 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 Skin cancer     SCC   ??? Ventricular tachycardia, nonsustained (HCC)        Past Surgical History:   Procedure Laterality Date   ??? HX CORONARY ARTERY BYPASS GRAFT  03/03/04    LIMA to the LAD. Sequential SVG to the diagonal and obtuse marginal branch   ??? HX CORONARY STENT PLACEMENT  10/12/01    RCA stented using 3.5 x 13 mm Express 2 stent   ??? HX OTHER SURGICAL  2014    s/p resection neurofibroma RLE   ??? HX TONSILLECTOMY         Current Outpatient Medications   Medication Sig Dispense Refill   ??? carvediloL (COREG) 6.25 mg tablet Take 1 Tab by mouth two (2) times daily (with meals). 180 Tab 3   ??? losartan (COZAAR) 50 mg tablet TAKE 1 TABLET BY MOUTH TWICE DAILY 60 Tab 11   ??? omega-3 acid ethyl esters (LOVAZA) 1 gram capsule Take 1 Cap by mouth daily. 90 Cap 3   ??? amLODIPine (NORVASC) 2.5 mg tablet Take 1 Tab by mouth daily.  30 Tab 5   ??? simvastatin (ZOCOR) 40 mg tablet Take 1 Tab by mouth nightly. 90 Tab 3   ??? triamcinolone (NASACORT AQ) 55 mcg nasal inhaler INSTILL 2 SPRAYS INTO EACH NOSTRIL DAILY 17 g 8   ??? aspirin 81 mg  tablet Take 81 mg by mouth daily.     ??? MULTIVITAMIN PO Take 1 Tab by mouth daily.         The patient has a family history of    Social History     Tobacco Use   ??? Smoking status: Never Smoker   ??? Smokeless tobacco: Never Used   Substance Use Topics   ??? Alcohol use: Yes     Comment: social   ??? Drug use: No       Lab Results   Component Value Date/Time    Cholesterol, total 134 02/10/2019 11:00 AM    HDL Cholesterol 50 02/10/2019 11:00 AM    LDL, calculated 66.8 02/10/2019 11:00 AM    Triglyceride 86 02/10/2019 11:00 AM    CHOL/HDL Ratio 2.7 02/10/2019 11:00 AM        BP Readings from Last 3 Encounters:   04/17/19 (!) 140/78   02/17/19 127/77   10/08/18 150/90        Pulse Readings from Last 3 Encounters:   04/17/19 (!) 55   02/17/19 (!) 53   10/08/18 (!) 50       Wt Readings from Last 3 Encounters:   04/17/19 88 kg (194 lb)   04/15/19 86.6 kg (191 lb)   02/17/19 86.6 kg (191 lb)         EKG: unchanged from previous tracings, sinus bradycardia, RBBB, Q waves in leads III and aVF.

## 2019-04-17 NOTE — Progress Notes (Signed)
Raymond [name] presents today for   Chief Complaint   Patient presents with   . Follow-up     6 month follow up        Raymond [name] preferred language for health care discussion is english/other.    Is someone accompanying this pt? no    Is the patient using any DME equipment during OV? no    Depression Screening:  3 most recent PHQ Screens 02/17/2019   Little interest or pleasure in doing things Not at all   Feeling down, depressed, irritable, or hopeless Not at all   Total Score PHQ 2 0       Learning Assessment:  Learning Assessment 09/11/2013   PRIMARY LEARNER Patient   HIGHEST LEVEL OF EDUCATION - PRIMARY LEARNER  -   BARRIERS PRIMARY LEARNER -   CO-LEARNER CAREGIVER -   PRIMARY LANGUAGE ENGLISH   INTERPRETER NEED -   LEARNER PREFERENCE PRIMARY LISTENING   ANSWERED BY patient   RELATIONSHIP SELF       Abuse Screening:  Abuse Screening Questionnaire 02/17/2019   Do you ever feel afraid of your partner? -   Are you in a relationship with someone who physically or mentally threatens you? N   Is it safe for you to go home? Y       Fall Risk  Fall Risk Assessment, last 12 mths 02/17/2019   Able to walk? Yes   Fall in past 12 months? 0   Do you feel unsteady? 0   Are you worried about falling 0       Pt currently taking Anticoagulant therapy? ASA 81mg  every day     Coordination of Care:  1. Have you been to the ER, urgent care clinic since your last visit? Hospitalized since your last visit? no    2. Have you seen or consulted any other health care providers outside of the [facility] since your last visit? Include any pap smears or colon screening. no

## 2019-04-17 NOTE — Progress Notes (Signed)
HISTORY OF PRESENT ILLNESS  Raymond [name] is a 74 y.o. male.    ASSESSMENT and PLAN    Raymond [name], previous Dr. Peggyann Juba patient, has history of CAD.  Back in January 2006, he presented with sinking feeling and nonsustained VT.  Ultimately, he underwent coronary artery bypass graft surgery with LIMA to LAD as well as sequential SVG to diagonal and OM branch.  Since that time, he has had nuclear scan in 2012 which showed EF of 55% with fixed inferior defect.  His nuclear scan in February 2018 showed inferior fixed defect without ischemia.  His EF was noted to be 52%.  Because of bradycardia, he did have Holter monitor in February 2018 which showed baseline sinus bradycardia without symptoms.  His minimum heart rate was 41 bpm.  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  He does have history hypertension, and hyperlipidemia.  He denies active tobacco use.  He has chronic renal disease with his last creatinine in July 2020 to be about 1.65 from baseline of 1.36.  Diuretic regimen was decreased at that time.    ??? CAD:    Symptomatically stable.  ??? BP:    Well controlled.  ??? Rhythm:    Asymptomatic sinus bradycardia.  ??? CHF:    Currently, there is no evidence of decompensated CHF noted.  ??? Weight:    His weight today is 194 pounds.  His baseline weight is 194 pounds.  ??? Cholesterol:  Target LDL<70.  Zocor 40.  ??? Anti-coagulation:  Remains on ASA.        He had Cologuard screening test performed.  This came back abnormal.  Is scheduled undergo colonoscopy by Dr. Silver Huguenin.  From cardiac standpoint, he should be able to tolerate this.  I will see him back in 6 months.  Thank you.      Encounter Diagnoses   Name Primary?   ??? Bradycardia Yes   ??? Atherosclerosis of native coronary artery of native heart without angina pectoris,CABGx3 2006,EF 45%    ??? Nonsustained ventricular tachycardia ([facility])    ??? Hypercholesteremia    ??? Renal  dysfunction      current treatment plan is effective, no change in therapy  lab results and schedule of future lab studies reviewed with patient  reviewed diet, exercise and weight control  cardiovascular risk and specific lipid/LDL goals reviewed  use of aspirin to prevent MI and TIA's discussed      HPI   Today, Raymond [name] has no complaints of chest pains, increased shortness of breath or decreased exercise capacity.  He recently had Cologuard for screening.  It came back abnormal.  He was referred to gastroenterologist.  He is scheduled undergo colonoscopy by Dr. Silver Huguenin within the next 3 weeks.  He denies any recent episodes of orthopnea, PND, palpitations or dizziness.    Review of Systems   Respiratory: Negative for shortness of breath.    Cardiovascular: Negative for chest pain, palpitations, orthopnea, claudication, leg swelling and PND.   All other systems reviewed and are negative.      Physical Exam  Vitals signs and nursing note reviewed.   Constitutional:       Appearance: Normal appearance.   HENT:      Head: Normocephalic.   Eyes:      Conjunctiva/sclera: Conjunctivae normal.   Neck:      Musculoskeletal: No  neck rigidity.   Cardiovascular:      Rate and Rhythm: Regular rhythm. Bradycardia present.   Pulmonary:      Breath sounds: Normal breath sounds.   Abdominal:      Palpations: Abdomen is soft.   Musculoskeletal:         General: No swelling.   Skin:     General: Skin is warm and dry.   Neurological:      General: No focal deficit present.      Mental Status: He is alert and oriented to person, place, and time.   Psychiatric:         Mood and Affect: Mood normal.         Behavior: Behavior normal.         PCP: Bennie Hind, MD    Past Medical History:   Diagnosis Date   ??? 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 BPH with obstruction/lower urinary tract symptoms    ??? CABG x 3 02/2004   ??? Cardiac cath 02/29/2004    oRCA 100%.  LM patent.  p/mLAD 85%.  D1 90%.  D2 90%.   CX 45%.  LVEDP 12 mmHg.  EF 45%.  CABG recommended.   ??? Cardiac nuclear imaging test, mod risk 04/11/2013    Intermediate risk.  Mild-mod basal inferior infarction w/mild-mod peri-infarct ischemia in mid inferior, prox & mid inferolateral walls.  EF unreadable due to gating error.  Neg EKG on max EST.  Ex time 6 min.     ??? CKD (chronic kidney disease) stage 3, GFR 30-59 ml/min    ??? Coronary artery disease     IMI w stent of RCA 9/03; s/p CABG X3 1/16   ??? Dyslipidemia    ??? Elevated prostate specific antigen (PSA)     Dr Cristy Friedlander   ??? H/O echocardiogram     LVE.  EF 45-50%.  Inferior, inferolateral WMA (10/07)   ??? Hypertension    ??? IFG (impaired fasting glucose)    ??? Microscopic hematuria    ??? Pleural effusion on right    ??? Positive colorectal cancer screening using Cologuard test 02/2019   ??? Right renal cyst    ??? 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 Skin cancer     SCC   ??? Ventricular tachycardia, nonsustained (HCC)        Past Surgical History:   Procedure Laterality Date   ??? HX CORONARY ARTERY BYPASS GRAFT  03/03/04    LIMA to the LAD. Sequential SVG to the diagonal and obtuse marginal branch   ??? HX CORONARY STENT PLACEMENT  10/12/01    RCA stented using 3.5 x 13 mm Express 2 stent   ??? HX OTHER SURGICAL  2014    s/p resection neurofibroma RLE   ??? HX TONSILLECTOMY         Current Outpatient Medications   Medication Sig Dispense Refill   ??? carvediloL (COREG) 6.25 mg tablet Take 1 Tab by mouth two (2) times daily (with meals). 180 Tab 3   ??? losartan (COZAAR) 50 mg tablet TAKE 1 TABLET BY MOUTH TWICE DAILY 60 Tab 11   ??? omega-3 acid ethyl esters (LOVAZA) 1 gram capsule Take 1 Cap by mouth daily. 90 Cap 3   ??? amLODIPine (NORVASC) 2.5 mg tablet Take 1 Tab by mouth daily.  30 Tab 5   ??? simvastatin (ZOCOR) 40 mg tablet Take 1 Tab by mouth nightly. 90 Tab 3   ??? triamcinolone (NASACORT AQ) 55 mcg nasal inhaler INSTILL 2 SPRAYS INTO EACH NOSTRIL DAILY 17 g 8   ??? aspirin 81 mg  tablet Take 81 mg by mouth daily.     ??? MULTIVITAMIN PO Take 1 Tab by mouth daily.         The patient has a family history of    Social History     Tobacco Use   ??? Smoking status: Never Smoker   ??? Smokeless tobacco: Never Used   Substance Use Topics   ??? Alcohol use: Yes     Comment: social   ??? Drug use: No       Lab Results   Component Value Date/Time    Cholesterol, total 134 02/10/2019 11:00 AM    HDL Cholesterol 50 02/10/2019 11:00 AM    LDL, calculated 66.8 02/10/2019 11:00 AM    Triglyceride 86 02/10/2019 11:00 AM    CHOL/HDL Ratio 2.7 02/10/2019 11:00 AM        BP Readings from Last 3 Encounters:   04/17/19 (!) 140/78   02/17/19 127/77   10/08/18 150/90        Pulse Readings from Last 3 Encounters:   04/17/19 (!) 55   02/17/19 (!) 53   10/08/18 (!) 50       Wt Readings from Last 3 Encounters:   04/17/19 88 kg (194 lb)   04/15/19 86.6 kg (191 lb)   02/17/19 86.6 kg (191 lb)         EKG: unchanged from previous tracings, sinus bradycardia, RBBB, Q waves in leads III and aVF.

## 2019-04-18 NOTE — Telephone Encounter (Signed)
-----   Message from London Sheer, [place] sent at 04/16/2019  2:21 PM EST -----  PSA decreasing, but still elevated. Can we schedule him in 3 months on the nurses schedule for repeat PSA?

## 2019-04-18 NOTE — Telephone Encounter (Signed)
I spoke with pt and he was aware thanked me and call was ended

## 2019-05-05 ENCOUNTER — Inpatient Hospital Stay: Admit: 2019-05-05 | Payer: BLUE CROSS/BLUE SHIELD | Primary: Internal Medicine

## 2019-05-05 DIAGNOSIS — Z01812 Encounter for preprocedural laboratory examination: Secondary | ICD-10-CM

## 2019-05-06 LAB — NOVEL CORONAVIRUS (COVID-19): SARS-CoV-2: NOT DETECTED

## 2019-05-06 LAB — COVID-19: SARS-CoV-2: NOT DETECTED

## 2019-05-09 ENCOUNTER — Inpatient Hospital Stay: Payer: BLUE CROSS/BLUE SHIELD

## 2019-05-09 MED ORDER — LIDOCAINE (PF) 20 MG/ML (2 %) IJ SOLN
20 mg/mL (2 %) | INTRAMUSCULAR | Status: AC
Start: 2019-05-09 — End: ?

## 2019-05-09 MED ORDER — PROPOFOL 10 MG/ML IV EMUL
10 mg/mL | INTRAVENOUS | Status: AC
Start: 2019-05-09 — End: ?

## 2019-05-09 MED ORDER — LIDOCAINE (PF) 20 MG/ML (2 %) IJ SOLN
20 mg/mL (2 %) | INTRAMUSCULAR | Status: DC | PRN
Start: 2019-05-09 — End: 2019-05-09
  Administered 2019-05-09: 13:00:00 via INTRAVENOUS

## 2019-05-09 MED ORDER — FAMOTIDINE 20 MG TAB
20 mg | Freq: Once | ORAL | Status: AC
Start: 2019-05-09 — End: 2019-05-09
  Administered 2019-05-09: 13:00:00 via ORAL

## 2019-05-09 MED ORDER — LACTATED RINGERS IV
INTRAVENOUS | Status: DC
Start: 2019-05-09 — End: 2019-05-09
  Administered 2019-05-09: 13:00:00 via INTRAVENOUS

## 2019-05-09 MED ORDER — PROPOFOL 10 MG/ML IV EMUL
10 mg/mL | INTRAVENOUS | Status: DC | PRN
Start: 2019-05-09 — End: 2019-05-09
  Administered 2019-05-09 (×3): via INTRAVENOUS

## 2019-05-09 MED ORDER — LIDOCAINE (PF) 10 MG/ML (1 %) IJ SOLN
10 mg/mL (1 %) | INTRAMUSCULAR | Status: DC | PRN
Start: 2019-05-09 — End: 2019-05-09

## 2019-05-09 MED FILL — DIPRIVAN 10 MG/ML INTRAVENOUS EMULSION: 10 mg/mL | INTRAVENOUS | Qty: 40

## 2019-05-09 MED FILL — XYLOCAINE-MPF 20 MG/ML (2 %) INJECTION SOLUTION: 20 mg/mL (2 %) | INTRAMUSCULAR | Qty: 5

## 2019-05-09 MED FILL — FAMOTIDINE 20 MG TAB: 20 mg | ORAL | Qty: 1

## 2019-05-09 MED FILL — LACTATED RINGERS IV: INTRAVENOUS | Qty: 1000

## 2019-05-09 NOTE — Anesthesia Pre-Procedure Evaluation (Signed)
Relevant Problems   No relevant active problems       Anesthetic History   No history of anesthetic complications            Review of Systems / Medical History  Patient summary reviewed, nursing notes reviewed and pertinent labs reviewed    Pulmonary  Within defined limits                 Neuro/Psych   Within defined limits           Cardiovascular    Hypertension: well controlled          CAD, cardiac stents and CABG    Exercise tolerance: >4 METS  Comments: Stent 2003, restenosed so CABG in 2006   GI/Hepatic/Renal               Comments: cologuard positive Endo/Other             Other Findings              Physical Exam    Airway  Mallampati: II  TM Distance: 4 - 6 cm  Neck ROM: normal range of motion   Mouth opening: Normal     Cardiovascular    Rhythm: regular  Rate: normal         Dental  No notable dental hx       Pulmonary  Breath sounds clear to auscultation               Abdominal  GI exam deferred       Other Findings            Anesthetic Plan    ASA: 3  Anesthesia type: MAC          Induction: Intravenous  Anesthetic plan and risks discussed with: Patient

## 2019-05-09 NOTE — Other (Signed)
Assumed care of pt from ENDO via stretcher.  Attached to monitor.  VSS.  ENDO and anesthesia report appreciated.  Will montior.

## 2019-05-09 NOTE — Anesthesia Post-Procedure Evaluation (Signed)
Procedure(s):  COLONOSCOPY w polypectomy.    MAC    Anesthesia Post Evaluation      Multimodal analgesia: multimodal analgesia used between 6 hours prior to anesthesia start to PACU discharge  Patient location during evaluation: PACU  Patient participation: complete - patient participated  Level of consciousness: awake and alert  Pain management: adequate  Airway patency: patent  Anesthetic complications: no  Cardiovascular status: acceptable  Respiratory status: acceptable  Hydration status: acceptable  Post anesthesia nausea and vomiting:  none  Final Post Anesthesia Temperature Assessment:  Normothermia (36.0-37.5 degrees C)      INITIAL Post-op Vital signs:   Vitals Value Taken Time   BP 149/73 05/09/19 1002   Temp 36.4 ??C (97.5 ??F) 05/09/19 0943   Pulse 47 05/09/19 1010   Resp 15 05/09/19 1010   SpO2 100 % 05/09/19 1010

## 2019-05-09 NOTE — H&P (Signed)
[facility] [place]   [email or web address]      Impression:   1.cologuard +      Plan:     1. Colo mac all risks benefits and alt discussed       Chief Complaint: cologuard +      HPI:  Raymond [name] is a 74 y.o. male who is being seen on consult for cologuard +.      PMH:   Past Medical History:   Diagnosis Date   ??? Acute MI, inferior wall ([place]) 10/12/2001    s/p stent RCA   ??? BPH with obstruction/lower urinary tract symptoms    ??? CABG x 3 02/2004   ??? Cardiac cath 02/29/2004    oRCA 100%.  LM patent.  p/mLAD 85%.  D1 90%.  D2 90%.  CX 45%.  LVEDP 12 mmHg.  EF 45%.  CABG recommended.   ??? Cardiac nuclear imaging test, mod risk 04/11/2013    Intermediate risk.  Mild-mod basal inferior infarction w/mild-mod peri-infarct ischemia in mid inferior, prox & mid inferolateral walls.  EF unreadable due to gating error.  Neg EKG on max EST.  Ex time 6 min.     ??? CKD (chronic kidney disease) stage 3, GFR 30-59 ml/min (HCC)     being followed. borderline   ??? Coronary artery disease     IMI w stent of RCA 9/03; s/p CABG X3 1/16   ??? Dyslipidemia    ??? Elevated prostate specific antigen (PSA)    ??? H/O echocardiogram     LVE.  EF 45-50%.  Inferior, inferolateral WMA (10/07)   ??? Hypertension    ??? IFG (impaired fasting glucose)    ??? Microscopic hematuria    ??? Pleural effusion on right    ??? Positive colorectal cancer screening using Cologuard test 02/2019   ??? Right renal cyst    ??? 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 Skin cancer     SCC   ??? Ventricular tachycardia, nonsustained (HCC)        PSH:   Past Surgical History:   Procedure Laterality Date   ??? HX CORONARY ARTERY BYPASS GRAFT  03/03/04    LIMA to the LAD. Sequential SVG to the diagonal and obtuse marginal branch   ??? HX CORONARY STENT PLACEMENT  10/12/01    RCA stented using 3.5 x 13 mm Express 2 stent   ??? HX OTHER SURGICAL  2015    s/p resection neurofibroma RLE   ??? HX OTHER SURGICAL   2015    R kidney mass removal   ??? HX TONSILLECTOMY         Social HX:   Social History     Socioeconomic History   ??? Marital status: MARRIED     Spouse name: Not on file   ??? Number of children: Not on file   ??? Years of education: Not on file   ??? Highest education level: Not on file   Occupational History   ??? Not on file   Social Needs   ??? Financial resource strain: Not on file   ??? Food insecurity     Worry: Not on file     Inability: Not on file   ??? Transportation needs     Medical: Not on file     Non-medical: Not on file   Tobacco Use   ???  Smoking status: Never Smoker   ??? Smokeless tobacco: Never Used   Substance and Sexual Activity   ??? Alcohol use: Yes     Comment: social   ??? Drug use: No   ??? Sexual activity: Not on file   Lifestyle   ??? Physical activity     Days per week: Not on file     Minutes per session: Not on file   ??? Stress: Not on file   Relationships   ??? Social Product manager on phone: Not on file     Gets together: Not on file     Attends religious service: Not on file     Active member of club or organization: Not on file     Attends meetings of clubs or organizations: Not on file     Relationship status: Not on file   ??? Intimate partner violence     Fear of current or ex partner: Not on file     Emotionally abused: Not on file     Physically abused: Not on file     Forced sexual activity: Not on file   Other Topics Concern   ??? Not on file   Social History Narrative   ??? Not on file       FHX:   Family History   Problem Relation Age of Onset   ??? Heart Disease Father         x3 heart bypass   ??? Arthritis-osteo Mother    ??? Hypertension Sister        Allergy:   Allergies   Allergen Reactions   ??? Ace Inhibitors Unknown (comments) and Cough     Intolerance b/c of dry coughing   ??? Bee Sting [Sting, Bee] Hives       Home Medications:     Medications Prior to Admission   Medication Sig   ??? carvediloL (COREG) 6.25 mg tablet Take 1 Tab by mouth two (2) times daily (with meals).   ??? losartan (COZAAR) 50 mg  tablet TAKE 1 TABLET BY MOUTH TWICE DAILY   ??? omega-3 acid ethyl esters (LOVAZA) 1 gram capsule Take 1 Cap by mouth daily.   ??? amLODIPine (NORVASC) 2.5 mg tablet Take 1 Tab by mouth daily.   ??? simvastatin (ZOCOR) 40 mg tablet Take 1 Tab by mouth nightly.   ??? triamcinolone (NASACORT AQ) 55 mcg nasal inhaler INSTILL 2 SPRAYS INTO EACH NOSTRIL DAILY   ??? aspirin 81 mg tablet Take 81 mg by mouth daily.   ??? MULTIVITAMIN PO Take 1 Tab by mouth daily.       Review of Systems:     Constitutional: No fevers, chills, weight loss, fatigue.   Skin: No rashes, pruritis, jaundice, ulcerations, erythema.   HENT: No headaches, nosebleeds, sinus pressure, rhinorrhea, sore throat.   Eyes: No visual changes, blurred vision, eye pain, photophobia, jaundice.   Cardiovascular: No chest pain, heart palpitations.   Respiratory: No cough, SOB, wheezing, chest discomfort, orthopnea.   Gastrointestinal: Neg    Genitourinary: No dysuria, bleeding, discharge, pyuria.   Musculoskeletal: No weakness, arthralgias, wasting.   Endo: No sweats.   Heme: No bruising, easy bleeding.   Allergies: As noted.   Neurological: Cranial nerves intact.  Alert and oriented. Gait not assessed.   Psychiatric:  No anxiety, depression, hallucinations.                 Visit Vitals  BP (!) 174/88   Pulse Marland Kitchen)  56   Temp 97.7 ??F (36.5 ??C)   Resp 20   Ht _0  (1.753 m)   Wt 85.3 kg (188 lb)   SpO2 99%   BMI 27.76 kg/m??       Physical Assessment:     constitutional: appearance: well developed, well nourished, normal habitus, no deformities, in no acute distress.   skin: inspection: no rashes, ulcers, icterus or other lesions; no clubbing or telangiectasias. palpation: no induration or subcutaneos nodules.   eyes: inspection: normal conjunctivae and lids; no jaundice pupils: symmetrical, normoreactive to light, normal accommodation and size.   ENMT: mouth: normal oral mucosa,lips and gums; good dentition. oropharynx: normal tongue, hard and soft palate; posterior pharynx  without erythema, exudate or lesions.   neck: no masses organomegaly or tenderness.   respiratory: effort: normal chest excursion; no intercostal retraction or accessory muscle use.   cardiovascular: abdominal aorta: normal size and position; no bruits. palpation: PMI of normal size and position; normal rhythm; no thrill or murmurs.   abdominal: abdomen: normal consistency; no tenderness or masses. hernias: no hernias appreciated. liver: normal size and consistency. spleen: not palpable.   rectal: hemoccult/guaiac: not performed.   musculoskeletal: no deformities or muscle wasting   lymphatic: axilae: not palpable. groin: not palpable. neck: within normal limits. other: not palpable.   neurologic: cranial nerves: II-XII normal.   psychiatric: judgement/insight: within normal limits. memory: within normal limits for recent and remote events. mood and affect: no evidence of depression, anxiety or agitation. orientation: oriented to time, space and person.        Basic Metabolic Profile   No results for input(s): NA, K, CL, CO2, BUN, GLU, CA, MG, PHOS in the last 72 hours.    No lab exists for component: CREAT      CBC w/Diff    No results for input(s): WBC, RBC, HGB, HCT, MCV, MCH, MCHC, RDW, PLT, HGBEXT, HCTEXT, PLTEXT in the last 72 hours.    No lab exists for component: MPV No results for input(s): GRANS, LYMPH, EOS, PRO, MYELO, METAS, BLAST in the last 72 hours.    No lab exists for component: MONO, BASO     Hepatic Function   No results for input(s): ALB, TP, TBILI, AP, AML, LPSE in the last 72 hours.    No lab exists for component: DBILI, GPT, SGOT       Jonell Cluck, MD, M.D.   [facility] [place] [place]  [email or web address]

## 2019-05-09 NOTE — Anesthesia Post-Procedure Evaluation (Signed)
Procedure(s):  COLONOSCOPY w polypectomy.    MAC    Anesthesia Post Evaluation      Multimodal analgesia: multimodal analgesia used between 6 hours prior to anesthesia start to PACU discharge  Patient location during evaluation: PACU  Patient participation: complete - patient participated  Level of consciousness: awake and alert  Pain management: adequate  Airway patency: patent  Anesthetic complications: no  Cardiovascular status: acceptable  Respiratory status: acceptable  Hydration status: acceptable  Post anesthesia nausea and vomiting:  none  Final Post Anesthesia Temperature Assessment:  Normothermia (36.0-37.5 degrees C)      INITIAL Post-op Vital signs:   Vitals Value Taken Time   BP 149/73 05/09/19 1002   Temp 36.4 ??C (97.5 ??F) 05/09/19 0943   Pulse 47 05/09/19 1010   Resp 15 05/09/19 1010   SpO2 100 % 05/09/19 1010

## 2019-05-09 NOTE — H&P (Signed)
[facility] [place]   [email or web address]      Impression:   1.cologuard +      Plan:     1. Colo mac all risks benefits and alt discussed       Chief Complaint: cologuard +      HPI:  Raymond [name] is a 74 y.o. male who is being seen on consult for cologuard +.      PMH:   Past Medical History:   Diagnosis Date   ??? Acute MI, inferior wall ([place]) 10/12/2001    s/p stent RCA   ??? BPH with obstruction/lower urinary tract symptoms    ??? CABG x 3 02/2004   ??? Cardiac cath 02/29/2004    oRCA 100%.  LM patent.  p/mLAD 85%.  D1 90%.  D2 90%.  CX 45%.  LVEDP 12 mmHg.  EF 45%.  CABG recommended.   ??? Cardiac nuclear imaging test, mod risk 04/11/2013    Intermediate risk.  Mild-mod basal inferior infarction w/mild-mod peri-infarct ischemia in mid inferior, prox & mid inferolateral walls.  EF unreadable due to gating error.  Neg EKG on max EST.  Ex time 6 min.     ??? CKD (chronic kidney disease) stage 3, GFR 30-59 ml/min (HCC)     being followed. borderline   ??? Coronary artery disease     IMI w stent of RCA 9/03; s/p CABG X3 1/16   ??? Dyslipidemia    ??? Elevated prostate specific antigen (PSA)    ??? H/O echocardiogram     LVE.  EF 45-50%.  Inferior, inferolateral WMA (10/07)   ??? Hypertension    ??? IFG (impaired fasting glucose)    ??? Microscopic hematuria    ??? Pleural effusion on right    ??? Positive colorectal cancer screening using Cologuard test 02/2019   ??? Right renal cyst    ??? 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 Skin cancer     SCC   ??? Ventricular tachycardia, nonsustained (HCC)        PSH:   Past Surgical History:   Procedure Laterality Date   ??? HX CORONARY ARTERY BYPASS GRAFT  03/03/04    LIMA to the LAD. Sequential SVG to the diagonal and obtuse marginal branch   ??? HX CORONARY STENT PLACEMENT  10/12/01    RCA stented using 3.5 x 13 mm Express 2 stent   ??? HX OTHER SURGICAL  2015    s/p resection neurofibroma RLE   ??? HX OTHER SURGICAL   2015    R kidney mass removal   ??? HX TONSILLECTOMY         Social HX:   Social History     Socioeconomic History   ??? Marital status: MARRIED     Spouse name: Not on file   ??? Number of children: Not on file   ??? Years of education: Not on file   ??? Highest education level: Not on file   Occupational History   ??? Not on file   Social Needs   ??? Financial resource strain: Not on file   ??? Food insecurity     Worry: Not on file     Inability: Not on file   ??? Transportation needs     Medical: Not on file     Non-medical: Not on file   Tobacco Use   ???  Smoking status: Never Smoker   ??? Smokeless tobacco: Never Used   Substance and Sexual Activity   ??? Alcohol use: Yes     Comment: social   ??? Drug use: No   ??? Sexual activity: Not on file   Lifestyle   ??? Physical activity     Days per week: Not on file     Minutes per session: Not on file   ??? Stress: Not on file   Relationships   ??? Social Product manager on phone: Not on file     Gets together: Not on file     Attends religious service: Not on file     Active member of club or organization: Not on file     Attends meetings of clubs or organizations: Not on file     Relationship status: Not on file   ??? Intimate partner violence     Fear of current or ex partner: Not on file     Emotionally abused: Not on file     Physically abused: Not on file     Forced sexual activity: Not on file   Other Topics Concern   ??? Not on file   Social History Narrative   ??? Not on file       FHX:   Family History   Problem Relation Age of Onset   ??? Heart Disease Father         x3 heart bypass   ??? Arthritis-osteo Mother    ??? Hypertension Sister        Allergy:   Allergies   Allergen Reactions   ??? Ace Inhibitors Unknown (comments) and Cough     Intolerance b/c of dry coughing   ??? Bee Sting [Sting, Bee] Hives       Home Medications:     Medications Prior to Admission   Medication Sig   ??? carvediloL (COREG) 6.25 mg tablet Take 1 Tab by mouth two (2) times daily (with meals).   ??? losartan (COZAAR) 50 mg  tablet TAKE 1 TABLET BY MOUTH TWICE DAILY   ??? omega-3 acid ethyl esters (LOVAZA) 1 gram capsule Take 1 Cap by mouth daily.   ??? amLODIPine (NORVASC) 2.5 mg tablet Take 1 Tab by mouth daily.   ??? simvastatin (ZOCOR) 40 mg tablet Take 1 Tab by mouth nightly.   ??? triamcinolone (NASACORT AQ) 55 mcg nasal inhaler INSTILL 2 SPRAYS INTO EACH NOSTRIL DAILY   ??? aspirin 81 mg tablet Take 81 mg by mouth daily.   ??? MULTIVITAMIN PO Take 1 Tab by mouth daily.       Review of Systems:     Constitutional: No fevers, chills, weight loss, fatigue.   Skin: No rashes, pruritis, jaundice, ulcerations, erythema.   HENT: No headaches, nosebleeds, sinus pressure, rhinorrhea, sore throat.   Eyes: No visual changes, blurred vision, eye pain, photophobia, jaundice.   Cardiovascular: No chest pain, heart palpitations.   Respiratory: No cough, SOB, wheezing, chest discomfort, orthopnea.   Gastrointestinal: Neg    Genitourinary: No dysuria, bleeding, discharge, pyuria.   Musculoskeletal: No weakness, arthralgias, wasting.   Endo: No sweats.   Heme: No bruising, easy bleeding.   Allergies: As noted.   Neurological: Cranial nerves intact.  Alert and oriented. Gait not assessed.   Psychiatric:  No anxiety, depression, hallucinations.                 Visit Vitals  BP (!) 174/88   Pulse Marland Kitchen)  56   Temp 97.7 ??F (36.5 ??C)   Resp 20   Ht _0  (1.753 m)   Wt 85.3 kg (188 lb)   SpO2 99%   BMI 27.76 kg/m??       Physical Assessment:     constitutional: appearance: well developed, well nourished, normal habitus, no deformities, in no acute distress.   skin: inspection: no rashes, ulcers, icterus or other lesions; no clubbing or telangiectasias. palpation: no induration or subcutaneos nodules.   eyes: inspection: normal conjunctivae and lids; no jaundice pupils: symmetrical, normoreactive to light, normal accommodation and size.   ENMT: mouth: normal oral mucosa,lips and gums; good dentition. oropharynx: normal tongue, hard and soft palate; posterior pharynx  without erythema, exudate or lesions.   neck: no masses organomegaly or tenderness.   respiratory: effort: normal chest excursion; no intercostal retraction or accessory muscle use.   cardiovascular: abdominal aorta: normal size and position; no bruits. palpation: PMI of normal size and position; normal rhythm; no thrill or murmurs.   abdominal: abdomen: normal consistency; no tenderness or masses. hernias: no hernias appreciated. liver: normal size and consistency. spleen: not palpable.   rectal: hemoccult/guaiac: not performed.   musculoskeletal: no deformities or muscle wasting   lymphatic: axilae: not palpable. groin: not palpable. neck: within normal limits. other: not palpable.   neurologic: cranial nerves: II-XII normal.   psychiatric: judgement/insight: within normal limits. memory: within normal limits for recent and remote events. mood and affect: no evidence of depression, anxiety or agitation. orientation: oriented to time, space and person.        Basic Metabolic Profile   No results for input(s): NA, K, CL, CO2, BUN, GLU, CA, MG, PHOS in the last 72 hours.    No lab exists for component: CREAT      CBC w/Diff    No results for input(s): WBC, RBC, HGB, HCT, MCV, MCH, MCHC, RDW, PLT, HGBEXT, HCTEXT, PLTEXT in the last 72 hours.    No lab exists for component: MPV No results for input(s): GRANS, LYMPH, EOS, PRO, MYELO, METAS, BLAST in the last 72 hours.    No lab exists for component: MONO, BASO     Hepatic Function   No results for input(s): ALB, TP, TBILI, AP, AML, LPSE in the last 72 hours.    No lab exists for component: DBILI, GPT, SGOT       Jonell Cluck, MD, M.D.   [facility] [place] [place]  [email or web address]

## 2019-05-09 NOTE — Interval H&P Note (Signed)
Assumed care of pt from ENDO via stretcher.  Attached to monitor.  VSS.  ENDO and anesthesia report appreciated.  Will montior.

## 2019-05-09 NOTE — Anesthesia Pre-Procedure Evaluation (Signed)
Relevant Problems   No relevant active problems       Anesthetic History   No history of anesthetic complications            Review of Systems / Medical History  Patient summary reviewed, nursing notes reviewed and pertinent labs reviewed    Pulmonary  Within defined limits                 Neuro/Psych   Within defined limits           Cardiovascular    Hypertension: well controlled          CAD, cardiac stents and CABG    Exercise tolerance: >4 METS  Comments: Stent 2003, restenosed so CABG in 2006   GI/Hepatic/Renal               Comments: cologuard positive Endo/Other             Other Findings              Physical Exam    Airway  Mallampati: II  TM Distance: 4 - 6 cm  Neck ROM: normal range of motion   Mouth opening: Normal     Cardiovascular    Rhythm: regular  Rate: normal         Dental  No notable dental hx       Pulmonary  Breath sounds clear to auscultation               Abdominal  GI exam deferred       Other Findings            Anesthetic Plan    ASA: 3  Anesthesia type: MAC          Induction: Intravenous  Anesthetic plan and risks discussed with: Patient

## 2019-05-12 ENCOUNTER — Inpatient Hospital Stay: Admit: 2019-05-12 | Payer: BLUE CROSS/BLUE SHIELD | Primary: Internal Medicine

## 2019-05-12 ENCOUNTER — Ambulatory Visit: Admit: 2019-05-12 | Discharge: 2019-05-12 | Payer: BLUE CROSS/BLUE SHIELD | Primary: Internal Medicine

## 2019-05-12 DIAGNOSIS — I10 Essential (primary) hypertension: Secondary | ICD-10-CM

## 2019-05-12 LAB — METABOLIC PANEL, BASIC
Anion gap: 7 mmol/L (ref 3.0–18)
BUN/Creatinine ratio: 15 (ref 12–20)
BUN: 21 MG/DL — ABNORMAL HIGH (ref 7.0–18)
CO2: 28 mmol/L (ref 21–32)
Calcium: 8.7 MG/DL (ref 8.5–10.1)
Chloride: 109 mmol/L (ref 100–111)
Creatinine: 1.37 MG/DL — ABNORMAL HIGH (ref 0.6–1.3)
GFR est AA: >60 mL/min/{1.73_m2} (ref >60)
GFR est non-AA: 51 mL/min/{1.73_m2} — ABNORMAL LOW (ref >60)
Glucose: 98 mg/dL (ref 74–99)
Potassium: 3.6 mmol/L (ref 3.5–5.5)
Sodium: 144 mmol/L (ref 136–145)

## 2019-05-12 LAB — BASIC METABOLIC PANEL
Anion Gap: 7 mmol/L (ref 3.0–18)
BUN: 21 MG/DL — ABNORMAL HIGH (ref 7.0–18)
Bun/Cre Ratio: 15 (ref 12–20)
CO2: 28 mmol/L (ref 21–32)
Calcium: 8.7 MG/DL (ref 8.5–10.1)
Chloride: 109 mmol/L (ref 100–111)
Creatinine: 1.37 MG/DL — ABNORMAL HIGH (ref 0.6–1.3)
EGFR IF NonAfrican American: 51 mL/min/{1.73_m2} — ABNORMAL LOW (ref >60)
GFR African American: >60 mL/min/{1.73_m2} (ref >60)
Glucose: 98 mg/dL (ref 74–99)
Potassium: 3.6 mmol/L (ref 3.5–5.5)
Sodium: 144 mmol/L (ref 136–145)

## 2019-05-19 ENCOUNTER — Ambulatory Visit
Admit: 2019-05-19 | Discharge: 2019-05-19 | Payer: BLUE CROSS/BLUE SHIELD | Attending: Internal Medicine | Primary: Internal Medicine

## 2019-05-19 ENCOUNTER — Telehealth

## 2019-05-19 ENCOUNTER — Ambulatory Visit: Attending: Internal Medicine | Primary: Internal Medicine

## 2019-05-19 DIAGNOSIS — I10 Essential (primary) hypertension: Secondary | ICD-10-CM

## 2019-05-19 MED ORDER — ICOSAPENT ETHYL 1 GRAM CAPSULE
1 gram | ORAL_CAPSULE | Freq: Two times a day (BID) | ORAL | 11 refills | Status: DC
Start: 2019-05-19 — End: 2020-05-14

## 2019-05-19 NOTE — Progress Notes (Signed)
74 y.o. WHITE male who presents for evaluation.    No cardiovascular complaints.  He is now being followed by Dr Boyd Kerbs. Tries to remain active although no set exercise program.  Does a lot of yardwork, bowls once weekly.  At the last visit, we changed him from diuretic to amlodipine due to concerns about his rising creatinine.  Seems to be doing okay but the pressure is now running higher of late.  No side effects report except maybe mild ankle edema.  He had previously been on amlodipine but at a much higher dose    No gi issues currently.  He did have positive Cologuard and underwent colonoscopy which showed hyperplastic polyp    He is now seeing PA Aloia at [facility] for the elevated PSA, still elevated at last check but they are trending and following    Denies polyuria, polydipsia, nocturia, vision change.  Not checking sugars at this time. He's doing well with dietary mgmt, keeping the weight down    LAST MEDICARE WELLNESS EXAM: 09/10/15, 02/17/19    Past Medical History:   Diagnosis Date   ??? Acute MI, inferior wall ([place]) 10/12/2001    s/p stent RCA   ??? BPH with obstruction/lower urinary tract symptoms    ??? CABG x 3 02/2004   ??? Cardiac cath 02/29/2004    oRCA 100%.  LM patent.  p/mLAD 85%.  D1 90%.  D2 90%.  CX 45%.  LVEDP 12 mmHg.  EF 45%.  CABG recommended.   ??? CKD (chronic kidney disease) stage 3, GFR 30-59 ml/min (HCC)     being followed. borderline   ??? Colon polyp 05/09/2019    Dr Kellie Simmering   ??? Coronary artery disease     IMI w stent of RCA 9/03; s/p CABG X3 1/16   ??? Dyslipidemia    ??? Elevated prostate specific antigen (PSA)    ??? H/O cardiovascular stress test 04/11/2013    Intermediate risk.  Mild-mod basal inferior infarction w/mild-mod peri-infarct ischemia in mid inferior, prox & mid inferolateral walls.  EF unreadable due to gating error.  Neg EKG on max EST.  Ex time 6 min.     ??? H/O echocardiogram     LVE.  EF 45-50%.  Inferior, inferolateral WMA (10/07)   ??? Hypertension    ??? IFG (impaired  fasting glucose)    ??? Microscopic hematuria    ??? Pleural effusion on right    ??? Right renal cyst    ??? 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 SCC (squamous cell carcinoma)    ??? Ventricular tachycardia, nonsustained [facility])      Past Surgical History:   Procedure Laterality Date   ??? COLONOSCOPY N/A 05/09/2019    cologuard+ 1/21; Dr Kellie Simmering polyp   ??? HX CORONARY ARTERY BYPASS GRAFT  03/03/04    LIMA to the LAD. Sequential SVG to the diagonal and obtuse marginal branch   ??? HX CORONARY STENT PLACEMENT  10/12/01    RCA stented using 3.5 x 13 mm Express 2 stent   ??? HX OTHER SURGICAL  2015    s/p resection neurofibroma RLE   ??? HX OTHER SURGICAL  2015    R kidney mass removal   ??? HX TONSILLECTOMY       Social History     Socioeconomic History   ??? Marital status: MARRIED  Spouse name: Not on file   ??? Number of children: Not on file   ??? Years of education: Not on file   ??? Highest education level: Not on file   Occupational History   ??? Not on file   Social Needs   ??? Financial resource strain: Not on file   ??? Food insecurity     Worry: Not on file     Inability: Not on file   ??? Transportation needs     Medical: Not on file     Non-medical: Not on file   Tobacco Use   ??? Smoking status: Never Smoker   ??? Smokeless tobacco: Never Used   Substance and Sexual Activity   ??? Alcohol use: Yes     Comment: social   ??? Drug use: No   ??? Sexual activity: Not on file   Lifestyle   ??? Physical activity     Days per week: Not on file     Minutes per session: Not on file   ??? Stress: Not on file   Relationships   ??? Social Wellsite geologist on phone: Not on file     Gets together: Not on file     Attends religious service: Not on file     Active member of club or organization: Not on file     Attends meetings of clubs or organizations: Not on file     Relationship status: Not on file   ??? Intimate partner violence     Fear of current or ex partner: Not on file     Emotionally abused: Not on file      Physically abused: Not on file     Forced sexual activity: Not on file   Other Topics Concern   ??? Not on file   Social History Narrative   ??? Not on file     Current Outpatient Medications   Medication Sig   ??? carvediloL (COREG) 6.25 mg tablet Take 1 Tab by mouth two (2) times daily (with meals).   ??? losartan (COZAAR) 50 mg tablet TAKE 1 TABLET BY MOUTH TWICE DAILY   ??? omega-3 acid ethyl esters (LOVAZA) 1 gram capsule Take 1 Cap by mouth daily.   ??? amLODIPine (NORVASC) 2.5 mg tablet Take 1 Tab by mouth daily.   ??? simvastatin (ZOCOR) 40 mg tablet Take 1 Tab by mouth nightly.   ??? triamcinolone (NASACORT AQ) 55 mcg nasal inhaler INSTILL 2 SPRAYS INTO EACH NOSTRIL DAILY   ??? aspirin 81 mg tablet Take 81 mg by mouth daily.   ??? MULTIVITAMIN PO Take 1 Tab by mouth daily.     No current facility-administered medications for this visit.      Allergies   Allergen Reactions   ??? Ace Inhibitors Unknown (comments) and Cough     Intolerance b/c of dry coughing   ??? Bee Sting [Sting, Bee] Hives     REVIEW OF SYSTEMS: colo never  Ophtho ??? no vision change or eye pain  Oral ??? no mouth pain, tongue or tooth problems  Ears ??? no hearing loss, ear pain, fullness, no swallowing problems  Cardiac ??? no CP, PND, orthopnea, edema, palpitations or syncope  Chest ??? no breast masses  Resp ??? no wheezing, chronic coughing, dyspnea  GI ??? no heartburn, nausea, vomiting, change in bowel habits, bleeding, hemorrhoids  Urinary ??? no dysuria, hematuria, flank pain, urgency, frequency    Visit Vitals  BP (!) 160/70  Pulse (!) 58   Temp 97.5 ??F (36.4 ??C) (Temporal)   Resp 14   Ht 5' 9" (1.753 m)   Wt 190 lb (86.2 kg)   SpO2 98%   BMI 28.06 kg/m??     A&O x3  Affect is appropriate.  Mood stable  No apparent distress  Anicteric, no JVD, adenopathy or thyromegaly.   No carotid bruits or radiated murmur  Lungs clear to auscultation, no wheezes or rales  Heart showed regular rate and rhythm. No murmur, rubs, gallops  Abdomen soft nontender, no hepatosplenomegaly  or masses.   Extremities without edema.  Pulses 1-2+ symmetrically    LABS  From 2/16 showed gluc 101, cr 1.28, gfr 56,  alt 12,                chol 143, tg 65, hdl 61, ldl-c 69, wbc 5.0, hb 13.3, plt 175, tsh 2.94, ua neg  From 2/17 showed gluc 96,   cr 1.19, gfr>60, alt 29, hba1c 5.4, umar 5.0,  chol 170, tg 65, hdl 66, ldl-c 91, wbc 5.3, hb 13.6, plt 184, tsh 2.92, ua neg  From 2/18 showed gluc 104, cr 1.33, gfr 53,  alt 13, hba1c 5.6, umar 6.0,  chol 121, tg 51, hdl 58, ldl-c 53, wbc 5.2, hb 13.6, plt 177, tsh 3.09  From 8/18 showed gluc 104, cr 1.26, gfr 56,  alt 33,                chol 135, tg 58, hdl 61, ldl-c 62, wbc 6.0, hb 13.6, plt 180  From 8/19 showed gluc 97,   cr 1.36, gfr 51,     chol 134, tg 82, hdl 51, ldl-c 67,                psa 2.90  From 2/20 showed gluc 96,   cr 1.28, gfr 55,  alt 24,                chol 137, tg 66, hdl 57, ldl-c 67  From 7/20 showed gluc 93,   cr 1.67, gfr 40,  alt 27,     chol 151, tg 95, hdl 58, ldl-c 74  From 1/21 showed gluc 95,   cr 1.74, gfr 39,  alt 25, hba1c 5.5,   chol 134, tg 84, hdl 50, ldl-c 67, wbc 6.0, hb 12.9, plt 194,             psa 5.30    Results for orders placed or performed during the hospital encounter of 05/12/19   METABOLIC PANEL, BASIC   Result Value Ref Range    Sodium 144 136 - 145 mmol/L    Potassium 3.6 3.5 - 5.5 mmol/L    Chloride 109 100 - 111 mmol/L    CO2 28 21 - 32 mmol/L    Anion gap 7 3.0 - 18 mmol/L    Glucose 98 74 - 99 mg/dL    BUN 21 (H) 7.0 - 18 MG/DL    Creatinine 1.37 (H) 0.6 - 1.3 MG/DL    BUN/Creatinine ratio 15 12 - 20      GFR est AA >60 >60 ml/min/1.73m2    GFR est non-AA 51 (L) >60 ml/min/1.73m2    Calcium 8.7 8.5 - 10.1 MG/DL     We reviewed the patient's labs from the last several visits to point out trends in the numbers        Patient Active Problem List   Diagnosis Code   ???   CAD, history of inferior wall myocardial infarction/status post stenting of the RCA in September 2003/status post CABG X3 in January 2006/EF 45%.  I25.10   ??? PVC's    ??? BPH with obstruction/lower urinary tract symptoms N40.1, N13.8   ??? Elevated prostate specific antigen (PSA) R97.20   ??? Hyperlipidemia E78.5   ??? Primary hypertension I10   ??? Impaired fasting glucose R73.01   ??? CKD (chronic kidney disease) stage 3, GFR 30-59 ml/min (HCC) N18.30   ??? Colon polyp K63.5     Assessment and plan:  1. CHD.  F/U Dr Dory Peru  2. BPH/LUTS, elev psa.  F/u PA Aloia  3. HLP.  Continue current regimen. Check on vascepa coverage  4. HTN.  Increase Coreg to 12.5, side effects discussed, call with an update.  Continue current losartan and amlodipine dosing.  5. IFG.  Lifestyle and dietary measures. Weight loss would be ideal  6. CKD.  Improved, continue to follow  7.  Colon polyps.  Follow-up colonoscopy 2028 per GI?      RTC 8/21        Instructions above were handwritten on the lab results sheet that I gave the patient today    Above conditions discussed at length and patient vocalized understanding.  All questions answered to patient satisfaction

## 2019-05-19 NOTE — Telephone Encounter (Signed)
sent 

## 2019-05-19 NOTE — Telephone Encounter (Signed)
Left message rx called in

## 2019-05-19 NOTE — Telephone Encounter (Signed)
Pt calling, says he saw rd today and was supposed to call insurance about Vascepa.    Says he called and it is not as expensive as he thought.     He would like to try the medication. Please call into [facility] on [address]

## 2019-05-19 NOTE — Progress Notes (Signed)
74 y.o. WHITE male who presents for evaluation.    No cardiovascular complaints.  He is now being followed by Dr Boyd Kerbs. Tries to remain active although no set exercise program.  Does a lot of yardwork, bowls once weekly.  At the last visit, we changed him from diuretic to amlodipine due to concerns about his rising creatinine.  Seems to be doing okay but the pressure is now running higher of late.  No side effects report except maybe mild ankle edema.  He had previously been on amlodipine but at a much higher dose    No gi issues currently.  He did have positive Cologuard and underwent colonoscopy which showed hyperplastic polyp    He is now seeing PA Aloia at [facility] for the elevated PSA, still elevated at last check but they are trending and following    Denies polyuria, polydipsia, nocturia, vision change.  Not checking sugars at this time. He's doing well with dietary mgmt, keeping the weight down    LAST MEDICARE WELLNESS EXAM: 09/10/15, 02/17/19    Past Medical History:   Diagnosis Date   ??? Acute MI, inferior wall ([place]) 10/12/2001    s/p stent RCA   ??? BPH with obstruction/lower urinary tract symptoms    ??? CABG x 3 02/2004   ??? Cardiac cath 02/29/2004    oRCA 100%.  LM patent.  p/mLAD 85%.  D1 90%.  D2 90%.  CX 45%.  LVEDP 12 mmHg.  EF 45%.  CABG recommended.   ??? CKD (chronic kidney disease) stage 3, GFR 30-59 ml/min (HCC)     being followed. borderline   ??? Colon polyp 05/09/2019    Dr Kellie Simmering   ??? Coronary artery disease     IMI w stent of RCA 9/03; s/p CABG X3 1/16   ??? Dyslipidemia    ??? Elevated prostate specific antigen (PSA)    ??? H/O cardiovascular stress test 04/11/2013    Intermediate risk.  Mild-mod basal inferior infarction w/mild-mod peri-infarct ischemia in mid inferior, prox & mid inferolateral walls.  EF unreadable due to gating error.  Neg EKG on max EST.  Ex time 6 min.     ??? H/O echocardiogram     LVE.  EF 45-50%.  Inferior, inferolateral WMA (10/07)   ??? Hypertension    ??? IFG (impaired  fasting glucose)    ??? Microscopic hematuria    ??? Pleural effusion on right    ??? Right renal cyst    ??? 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 SCC (squamous cell carcinoma)    ??? Ventricular tachycardia, nonsustained [facility])      Past Surgical History:   Procedure Laterality Date   ??? COLONOSCOPY N/A 05/09/2019    cologuard+ 1/21; Dr Kellie Simmering polyp   ??? HX CORONARY ARTERY BYPASS GRAFT  03/03/04    LIMA to the LAD. Sequential SVG to the diagonal and obtuse marginal branch   ??? HX CORONARY STENT PLACEMENT  10/12/01    RCA stented using 3.5 x 13 mm Express 2 stent   ??? HX OTHER SURGICAL  2015    s/p resection neurofibroma RLE   ??? HX OTHER SURGICAL  2015    R kidney mass removal   ??? HX TONSILLECTOMY       Social History     Socioeconomic History   ??? Marital status: MARRIED  Spouse name: Not on file   ??? Number of children: Not on file   ??? Years of education: Not on file   ??? Highest education level: Not on file   Occupational History   ??? Not on file   Social Needs   ??? Financial resource strain: Not on file   ??? Food insecurity     Worry: Not on file     Inability: Not on file   ??? Transportation needs     Medical: Not on file     Non-medical: Not on file   Tobacco Use   ??? Smoking status: Never Smoker   ??? Smokeless tobacco: Never Used   Substance and Sexual Activity   ??? Alcohol use: Yes     Comment: social   ??? Drug use: No   ??? Sexual activity: Not on file   Lifestyle   ??? Physical activity     Days per week: Not on file     Minutes per session: Not on file   ??? Stress: Not on file   Relationships   ??? Social Wellsite geologist on phone: Not on file     Gets together: Not on file     Attends religious service: Not on file     Active member of club or organization: Not on file     Attends meetings of clubs or organizations: Not on file     Relationship status: Not on file   ??? Intimate partner violence     Fear of current or ex partner: Not on file     Emotionally abused: Not on file      Physically abused: Not on file     Forced sexual activity: Not on file   Other Topics Concern   ??? Not on file   Social History Narrative   ??? Not on file     Current Outpatient Medications   Medication Sig   ??? carvediloL (COREG) 6.25 mg tablet Take 1 Tab by mouth two (2) times daily (with meals).   ??? losartan (COZAAR) 50 mg tablet TAKE 1 TABLET BY MOUTH TWICE DAILY   ??? omega-3 acid ethyl esters (LOVAZA) 1 gram capsule Take 1 Cap by mouth daily.   ??? amLODIPine (NORVASC) 2.5 mg tablet Take 1 Tab by mouth daily.   ??? simvastatin (ZOCOR) 40 mg tablet Take 1 Tab by mouth nightly.   ??? triamcinolone (NASACORT AQ) 55 mcg nasal inhaler INSTILL 2 SPRAYS INTO EACH NOSTRIL DAILY   ??? aspirin 81 mg tablet Take 81 mg by mouth daily.   ??? MULTIVITAMIN PO Take 1 Tab by mouth daily.     No current facility-administered medications for this visit.      Allergies   Allergen Reactions   ??? Ace Inhibitors Unknown (comments) and Cough     Intolerance b/c of dry coughing   ??? Bee Sting [Sting, Bee] Hives     REVIEW OF SYSTEMS: colo never  Ophtho ??? no vision change or eye pain  Oral ??? no mouth pain, tongue or tooth problems  Ears ??? no hearing loss, ear pain, fullness, no swallowing problems  Cardiac ??? no CP, PND, orthopnea, edema, palpitations or syncope  Chest ??? no breast masses  Resp ??? no wheezing, chronic coughing, dyspnea  GI ??? no heartburn, nausea, vomiting, change in bowel habits, bleeding, hemorrhoids  Urinary ??? no dysuria, hematuria, flank pain, urgency, frequency    Visit Vitals  BP (!) 160/70  Pulse (!) 58   Temp 97.5 ??F (36.4 ??C) (Temporal)   Resp 14   Ht 5\' 9"  (1.753 m)   Wt 190 lb (86.2 kg)   SpO2 98%   BMI 28.06 kg/m??     A&O x3  Affect is appropriate.  Mood stable  No apparent distress  Anicteric, no JVD, adenopathy or thyromegaly.   No carotid bruits or radiated murmur  Lungs clear to auscultation, no wheezes or rales  Heart showed regular rate and rhythm. No murmur, rubs, gallops  Abdomen soft nontender, no hepatosplenomegaly  or masses.   Extremities without edema.  Pulses 1-2+ symmetrically    LABS  From 2/16 showed gluc 101, cr 1.28, gfr 56,  alt 12,                chol 143, tg 65, hdl 61, ldl-c 69, wbc 5.0, hb 13.3, plt 175, tsh 2.94, ua neg  From 2/17 showed gluc 96,   cr 1.19, gfr>60, alt 29, hba1c 5.4, umar 5.0,  chol 170, tg 65, hdl 66, ldl-c 91, wbc 5.3, hb 13.6, plt 184, tsh 2.92, ua neg  From 2/18 showed gluc 104, cr 1.33, gfr 53,  alt 13, hba1c 5.6, umar 6.0,  chol 121, tg 51, hdl 58, ldl-c 53, wbc 5.2, hb 13.6, plt 177, tsh 3.09  From 8/18 showed gluc 104, cr 1.26, gfr 56,  alt 33,                chol 135, tg 58, hdl 61, ldl-c 62, wbc 6.0, hb 13.6, plt 180  From 8/19 showed gluc 97,   cr 1.36, gfr 51,     chol 134, tg 82, hdl 51, ldl-c 67,                psa 2.90  From 2/20 showed gluc 96,   cr 1.28, gfr 55,  alt 24,                chol 137, tg 66, hdl 57, ldl-c 67  From 7/20 showed gluc 93,   cr 1.67, gfr 40,  alt 27,     chol 151, tg 95, hdl 58, ldl-c 74  From 1/21 showed gluc 95,   cr 1.74, gfr 39,  alt 25, hba1c 5.5,   chol 134, tg 84, hdl 50, ldl-c 67, wbc 6.0, hb 12.9, plt 194,             psa 5.30    Results for orders placed or performed during the hospital encounter of 05/12/19   METABOLIC PANEL, BASIC   Result Value Ref Range    Sodium 144 136 - 145 mmol/L    Potassium 3.6 3.5 - 5.5 mmol/L    Chloride 109 100 - 111 mmol/L    CO2 28 21 - 32 mmol/L    Anion gap 7 3.0 - 18 mmol/L    Glucose 98 74 - 99 mg/dL    BUN 21 (H) 7.0 - 18 MG/DL    Creatinine 07/12/19 (H) 0.6 - 1.3 MG/DL    BUN/Creatinine ratio 15 12 - 20      GFR est AA >60 >60 ml/min/1.61m2    GFR est non-AA 51 (L) >60 ml/min/1.56m2    Calcium 8.7 8.5 - 10.1 MG/DL     We reviewed the patient's labs from the last several visits to point out trends in the numbers        Patient Active Problem List   Diagnosis Code   ???  CAD, history of inferior wall myocardial infarction/status post stenting of the RCA in September 2003/status post CABG X3 in January 2006/EF 45%.  I25.10   ??? PVC's    ??? BPH with obstruction/lower urinary tract symptoms N40.1, N13.8   ??? Elevated prostate specific antigen (PSA) R97.20   ??? Hyperlipidemia E78.5   ??? Primary hypertension I10   ??? Impaired fasting glucose R73.01   ??? CKD (chronic kidney disease) stage 3, GFR 30-59 ml/min (HCC) N18.30   ??? Colon polyp K63.5     Assessment and plan:  1. CHD.  F/U Dr Dory Peru  2. BPH/LUTS, elev psa.  F/u PA Aloia  3. HLP.  Continue current regimen. Check on vascepa coverage  4. HTN.  Increase Coreg to 12.5, side effects discussed, call with an update.  Continue current losartan and amlodipine dosing.  5. IFG.  Lifestyle and dietary measures. Weight loss would be ideal  6. CKD.  Improved, continue to follow  7.  Colon polyps.  Follow-up colonoscopy 2028 per GI?      RTC 8/21        Instructions above were handwritten on the lab results sheet that I gave the patient today    Above conditions discussed at length and patient vocalized understanding.  All questions answered to patient satisfaction

## 2019-06-23 MED ORDER — CARVEDILOL 12.5 MG TAB
12.5 mg | ORAL_TABLET | Freq: Two times a day (BID) | ORAL | 3 refills | Status: DC
Start: 2019-06-23 — End: 2020-06-06

## 2019-06-23 NOTE — Telephone Encounter (Signed)
Pended with update dose. Please sign if appropriate.    Last Visit: 05/19/19 with MD Tyler Pita  Next Appointment: 09/29/19 with MD Tyler Pita    Requested Prescriptions     Pending Prescriptions Disp Refills   ??? carvediloL (COREG) 12.5 mg tablet 180 Tab 0     Sig: Take 1 Tab by mouth two (2) times daily (with meals).       Notes from last OV:  4. HTN.  Increase Coreg to 12.5, side effects discussed, call with an update.

## 2019-06-23 NOTE — Telephone Encounter (Signed)
12.5 mg 2 times per day    Pt says rd had changed his medication to 12.5 2 times a day so he is out early.needs new rx with the corrected dosage.    Says RD only concern was his heart rate would go too low. Says it has not. Says it is normally running 53-55 daily

## 2019-07-09 ENCOUNTER — Institutional Professional Consult (permissible substitution): Admit: 2019-07-09 | Discharge: 2019-07-09 | Primary: Internal Medicine

## 2019-07-09 DIAGNOSIS — R972 Elevated prostate specific antigen [PSA]: Secondary | ICD-10-CM

## 2019-07-09 NOTE — Progress Notes (Signed)
PSA has decreased another point which is fantastic. Follow up as scheduled.

## 2019-07-09 NOTE — Progress Notes (Signed)
Cornelius Burgio presents today for lab draw per PA Aloia order.   Dr. Sobol was present in the clinic as incident to.     PSA obtained via venipuncture without any difficulty.    Patient will be notified with lab results.       Orders Placed This Encounter   ??? PROSTATE SPECIFIC ANTIGEN, TOTAL (PSA)   ??? COLLECTION VENOUS BLOOD,VENIPUNCTURE       Starleen Trussell

## 2019-07-09 NOTE — Progress Notes (Signed)
Letter sent with results

## 2019-07-09 NOTE — Progress Notes (Signed)
PSA has decreased another point which is fantastic. Follow up as scheduled.

## 2019-07-09 NOTE — Progress Notes (Signed)
 Alm Sharps presents today for lab draw per PA Aloia order.   Dr. Otelia was present in the clinic as incident to.     PSA obtained via venipuncture without any difficulty.    Patient will be notified with lab results.       Orders Placed This Encounter   . PROSTATE SPECIFIC ANTIGEN, TOTAL (PSA)   . COLLECTION VENOUS BLOOD,VENIPUNCTURE       Raymond [name]

## 2019-07-10 LAB — PROSTATE SPECIFIC ANTIGEN, TOTAL (PSA)
PSA: 3.89 ng/mL (ref 0.00–4.00)
Prostate Specific Ag: 3.89 ng/mL (ref 0.00–4.00)

## 2019-08-12 ENCOUNTER — Encounter

## 2019-08-12 MED ORDER — AMLODIPINE 2.5 MG TAB
2.5 mg | ORAL_TABLET | Freq: Every day | ORAL | 1 refills | Status: DC
Start: 2019-08-12 — End: 2019-10-16

## 2019-08-12 NOTE — Telephone Encounter (Signed)
 Last Visit: 05/19/19 with MD Dumaran  Next Appointment: 09/29/19 with MD Dumaran  Previous Refill Encounter(s): 02/17/19 #30 with 5 refills    Requested Prescriptions     Pending Prescriptions Disp Refills   . amLODIPine  (NORVASC ) 2.5 mg tablet 90 Tablet 1     Sig: Take 1 Tablet by mouth daily.

## 2019-09-22 ENCOUNTER — Ambulatory Visit: Admit: 2019-09-22 | Discharge: 2019-09-22 | Payer: BLUE CROSS/BLUE SHIELD | Primary: Internal Medicine

## 2019-09-22 ENCOUNTER — Inpatient Hospital Stay: Admit: 2019-09-22 | Payer: BLUE CROSS/BLUE SHIELD | Primary: Internal Medicine

## 2019-09-22 DIAGNOSIS — R7301 Impaired fasting glucose: Secondary | ICD-10-CM

## 2019-09-22 LAB — METABOLIC PANEL, COMPREHENSIVE
A-G Ratio: 1.5 (ref 0.8–1.7)
ALT (SGPT): 21 U/L (ref 16–61)
AST (SGOT): 12 U/L (ref 10–38)
Albumin: 3.8 g/dL (ref 3.4–5.0)
Alk. phosphatase: 66 U/L (ref 45–117)
Anion gap: 4 mmol/L (ref 3.0–18)
BUN/Creatinine ratio: 20 (ref 12–20)
BUN: 25 MG/DL — ABNORMAL HIGH (ref 7.0–18)
Bilirubin, total: 0.8 MG/DL (ref 0.2–1.0)
CO2: 28 mmol/L (ref 21–32)
Calcium: 8.3 MG/DL — ABNORMAL LOW (ref 8.5–10.1)
Chloride: 111 mmol/L (ref 100–111)
Creatinine: 1.28 MG/DL (ref 0.6–1.3)
GFR est AA: >60 mL/min/{1.73_m2} (ref >60)
GFR est non-AA: 55 mL/min/{1.73_m2} — ABNORMAL LOW (ref >60)
Globulin: 2.6 g/dL (ref 2.0–4.0)
Glucose: 106 mg/dL — ABNORMAL HIGH (ref 74–99)
Potassium: 3.8 mmol/L (ref 3.5–5.5)
Protein, total: 6.4 g/dL (ref 6.4–8.2)
Sodium: 143 mmol/L (ref 136–145)

## 2019-09-22 LAB — LIPID PANEL
CHOL/HDL Ratio: 2 (ref 0–5.0)
Chol/HDL Ratio: 2 (ref 0–5.0)
Cholesterol, Total: 125 MG/DL (ref <200)
Cholesterol, total: 125 MG/DL (ref <200)
HDL Cholesterol: 63 MG/DL — ABNORMAL HIGH (ref 40–60)
HDL: 63 MG/DL — ABNORMAL HIGH (ref 40–60)
LDL Calculated: 53 MG/DL (ref 0–100)
LDL, calculated: 53 MG/DL (ref 0–100)
Triglyceride: 45 MG/DL (ref <150)
Triglycerides: 45 MG/DL (ref <150)
VLDL Cholesterol Calculated: 9 MG/DL
VLDL, calculated: 9 MG/DL

## 2019-09-22 LAB — HEMOGLOBIN A1C W/O EAG
Hemoglobin A1C: 5.3 % (ref 4.2–5.6)
Hemoglobin A1c: 5.3 % (ref 4.2–5.6)

## 2019-09-22 LAB — COMPREHENSIVE METABOLIC PANEL
ALT: 21 U/L (ref 16–61)
AST: 12 U/L (ref 10–38)
Albumin/Globulin Ratio: 1.5 (ref 0.8–1.7)
Albumin: 3.8 g/dL (ref 3.4–5.0)
Alkaline Phosphatase: 66 U/L (ref 45–117)
Anion Gap: 4 mmol/L (ref 3.0–18)
BUN: 25 MG/DL — ABNORMAL HIGH (ref 7.0–18)
Bun/Cre Ratio: 20 (ref 12–20)
CO2: 28 mmol/L (ref 21–32)
Calcium: 8.3 MG/DL — ABNORMAL LOW (ref 8.5–10.1)
Chloride: 111 mmol/L (ref 100–111)
Creatinine: 1.28 MG/DL (ref 0.6–1.3)
EGFR IF NonAfrican American: 55 mL/min/{1.73_m2} — ABNORMAL LOW (ref >60)
GFR African American: >60 mL/min/{1.73_m2} (ref >60)
Globulin: 2.6 g/dL (ref 2.0–4.0)
Glucose: 106 mg/dL — ABNORMAL HIGH (ref 74–99)
Potassium: 3.8 mmol/L (ref 3.5–5.5)
Sodium: 143 mmol/L (ref 136–145)
Total Bilirubin: 0.8 MG/DL (ref 0.2–1.0)
Total Protein: 6.4 g/dL (ref 6.4–8.2)

## 2019-09-29 ENCOUNTER — Ambulatory Visit
Admit: 2019-09-29 | Discharge: 2019-09-29 | Payer: BLUE CROSS/BLUE SHIELD | Attending: Internal Medicine | Primary: Internal Medicine

## 2019-09-29 ENCOUNTER — Ambulatory Visit: Attending: Internal Medicine | Primary: Internal Medicine

## 2019-09-29 DIAGNOSIS — I10 Essential (primary) hypertension: Secondary | ICD-10-CM

## 2019-09-29 NOTE — Progress Notes (Signed)
 Alm Sharps presents today for   Chief Complaint   Patient presents with   . Follow-up     4 month   . Hypertension   . Cholesterol Problem   . Labs     09-22-19       Coordination of Care:  1. Have you been to the ER, urgent care clinic since your last visit?   Hospitalized since your last visit? NO    2. Have you seen or consulted any other health care providers outside of the [facility] since your last visit?Include any pap smears or colon screening. YES

## 2019-09-29 NOTE — Progress Notes (Signed)
74 y.o. WHITE/NON-HISPANIC male who presents for evaluation.    No cardiovascular complaints.  He is now being followed by Dr Boyd Kerbs. Tries to remain active although no set exercise program.  Does a lot of yardwork, bowls once weekly.  bp running in the 120-130 range over 70s when he checks    No gi issues currently.  Continues to see PA Aloia at [facility] for the elevated PSA and it had dropped back to nl range at the last encounter    Denies polyuria, polydipsia, nocturia, vision change.  Not checking sugars at this time. He's doing well with dietary mgmt, keeping the weight down    Vitals 09/29/2019 05/19/2019 05/09/2019 05/09/2019 05/09/2019 05/09/2019   Weight 189 lb 190 lb         Vitals 05/09/2019 05/09/2019 05/09/2019   Weight   188 lb     LAST MEDICARE WELLNESS EXAM: 09/10/15, 02/17/19    Past Medical History:   Diagnosis Date   ??? Acute MI, inferior wall ([place]) 10/12/2001    s/p stent RCA   ??? BPH with obstruction/lower urinary tract symptoms    ??? CABG x 3 02/2004   ??? Cardiac cath 02/29/2004    oRCA 100%.  LM patent.  p/mLAD 85%.  D1 90%.  D2 90%.  CX 45%.  LVEDP 12 mmHg.  EF 45%.  CABG recommended.   ??? CKD (chronic kidney disease) stage 3, GFR 30-59 ml/min (HCC)     being followed. borderline   ??? Colon polyp 05/09/2019    Dr Kellie Simmering   ??? Coronary artery disease     IMI w stent of RCA 9/03; s/p CABG X3 1/16   ??? Diastasis recti    ??? Dyslipidemia    ??? Elevated prostate specific antigen (PSA)    ??? H/O cardiovascular stress test 04/11/2013    Intermediate risk.  Mild-mod basal inferior infarction w/mild-mod peri-infarct ischemia in mid inferior, prox & mid inferolateral walls.  EF unreadable due to gating error.  Neg EKG on max EST.  Ex time 6 min.     ??? H/O echocardiogram     LVE.  EF 45-50%.  Inferior, inferolateral WMA (10/07)   ??? Hypertension    ??? IFG (impaired fasting glucose)    ??? Microscopic hematuria    ??? Pleural effusion on right    ??? Right renal cyst    ??? 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 SCC (squamous cell carcinoma)    ??? Ventricular tachycardia, nonsustained [facility])      Past Surgical History:   Procedure Laterality Date   ??? COLONOSCOPY N/A 05/09/2019    cologuard+ 1/21; Dr Kellie Simmering hyperplastic polyp   ??? HX CORONARY ARTERY BYPASS GRAFT  03/03/04    LIMA to the LAD. Sequential SVG to the diagonal and obtuse marginal branch   ??? HX CORONARY STENT PLACEMENT  10/12/01    RCA stented using 3.5 x 13 mm Express 2 stent   ??? HX OTHER SURGICAL  2015    s/p resection neurofibroma RLE   ??? HX OTHER SURGICAL  2015    R kidney mass removal   ??? HX TONSILLECTOMY       Social History     Socioeconomic History   ??? Marital status: MARRIED     Spouse name: Not on file   ??? Number of children: Not on file   ??? Years  of education: Not on file   ??? Highest education level: Not on file   Occupational History   ??? Not on file   Tobacco Use   ??? Smoking status: Never Smoker   ??? Smokeless tobacco: Never Used   Vaping Use   ??? Vaping Use: Never used   Substance and Sexual Activity   ??? Alcohol use: Yes     Comment: social   ??? Drug use: No   ??? Sexual activity: Not on file   Other Topics Concern   ??? Not on file   Social History Narrative   ??? Not on file     Social Determinants of Health     Financial Resource Strain:    ??? Difficulty of Paying Living Expenses:    Food Insecurity:    ??? Worried About Charity fundraiser in the Last Year:    ??? Arboriculturist in the Last Year:    Transportation Needs:    ??? Film/video editor (Medical):    ??? Lack of Transportation (Non-Medical):    Physical Activity:    ??? Days of Exercise per Week:    ??? Minutes of Exercise per Session:    Stress:    ??? Feeling of Stress :    Social Connections:    ??? Frequency of Communication with Friends and Family:    ??? Frequency of Social Gatherings with Friends and Family:    ??? Attends Religious Services:    ??? Marine scientist or Organizations:    ??? Attends Music therapist:    ??? Marital Status:    Intimate Production manager Violence:    ??? Fear of  Current or Ex-Partner:    ??? Emotionally Abused:    ??? Physically Abused:    ??? Sexually Abused:      Current Outpatient Medications   Medication Sig   ??? amLODIPine (NORVASC) 2.5 mg tablet Take 1 Tablet by mouth daily.   ??? carvediloL (COREG) 12.5 mg tablet Take 1 Tab by mouth two (2) times daily (with meals).   ??? icosapent ethyL (VASCEPA) 1 gram capsule Take 2 Caps by mouth two (2) times daily (with meals).   ??? losartan (COZAAR) 50 mg tablet TAKE 1 TABLET BY MOUTH TWICE DAILY   ??? simvastatin (ZOCOR) 40 mg tablet Take 1 Tab by mouth nightly.   ??? triamcinolone (NASACORT AQ) 55 mcg nasal inhaler INSTILL 2 SPRAYS INTO EACH NOSTRIL DAILY   ??? aspirin 81 mg tablet Take 81 mg by mouth daily.   ??? MULTIVITAMIN PO Take 1 Tab by mouth daily.   ??? omega-3 acid ethyl esters (LOVAZA) 1 gram capsule Take 1 Cap by mouth daily. (Patient not taking: Reported on 09/29/2019)     No current facility-administered medications for this visit.     Allergies   Allergen Reactions   ??? Ace Inhibitors Unknown (comments) and Cough     Intolerance b/c of dry coughing   ??? Bee Sting [Sting, Bee] Hives     REVIEW OF SYSTEMS: colo 4/21 Dr Kellie Simmering  Ophtho ??? no vision change or eye pain  Oral ??? no mouth pain, tongue or tooth problems  Ears ??? no hearing loss, ear pain, fullness, no swallowing problems  Cardiac ??? no CP, PND, orthopnea, edema, palpitations or syncope  Chest ??? no breast masses  Resp ??? no wheezing, chronic coughing, dyspnea  GI ??? no heartburn, nausea, vomiting, change in bowel habits, bleeding, hemorrhoids  Urinary ??? no  dysuria, hematuria, flank pain, urgency, frequency    Visit Vitals  BP (!) 142/78   Pulse (!) 53   Temp 97.3 ??F (36.3 ??C) (Temporal)   Resp 16   Ht 5' 9"  (1.753 m)   Wt 189 lb (85.7 kg)   SpO2 99%   BMI 27.91 kg/m??     A&O x3  Affect is appropriate.  Mood stable  No apparent distress  Anicteric, no JVD, adenopathy or thyromegaly.   No carotid bruits or radiated murmur  Lungs clear to auscultation, no wheezes or rales  Heart showed  regular rate and rhythm. No murmur, rubs, gallops  Abdomen soft nontender, no hepatosplenomegaly or masses.  Diastasis noted  Extremities without edema.  Pulses 1-2+ symmetrically    LABS  From 2/16 showed gluc 101, cr 1.28, gfr 56,  alt 12,                chol 143, tg 65, hdl 61, ldl-c 69, wbc 5.0, hb 13.3, plt 175, tsh 2.94, ua neg  From 2/17 showed gluc 96,   cr 1.19, gfr>60, alt 29, hba1c 5.4, umar 5.0,  chol 170, tg 65, hdl 66, ldl-c 91, wbc 5.3, hb 13.6, plt 184, tsh 2.92, ua neg  From 2/18 showed gluc 104, cr 1.33, gfr 53,  alt 13, hba1c 5.6, umar 6.0,  chol 121, tg 51, hdl 58, ldl-c 53, wbc 5.2, hb 13.6, plt 177, tsh 3.09  From 8/18 showed gluc 104, cr 1.26, gfr 56,  alt 33,                chol 135, tg 58, hdl 61, ldl-c 62, wbc 6.0, hb 13.6, plt 180  From 8/19 showed gluc 97,   cr 1.36, gfr 51,     chol 134, tg 82, hdl 51, ldl-c 67,                psa 2.90  From 2/20 showed gluc 96,   cr 1.28, gfr 55,  alt 24,                chol 137, tg 66, hdl 57, ldl-c 67  From 7/20 showed gluc 93,   cr 1.67, gfr 40,  alt 27,     chol 151, tg 95, hdl 58, ldl-c 74  From 1/21 showed gluc 95,   cr 1.74, gfr 39,  alt 25, hba1c 5.5,   chol 134, tg 84, hdl 50, ldl-c 67, wbc 6.0, hb 12.9, plt 194,             psa 5.30  From 4/21 showed gluc 98,   cr 1.37, gfr 51    Results for orders placed or performed during the hospital encounter of 09/22/19   LIPID PANEL   Result Value Ref Range    LIPID PROFILE          Cholesterol, total 125 <200 MG/DL    Triglyceride 45 <150 MG/DL    HDL Cholesterol 63 (H) 40 - 60 MG/DL    LDL, calculated 53 0 - 100 MG/DL    VLDL, calculated 9 MG/DL    CHOL/HDL Ratio 2.0 0 - 5.0     METABOLIC PANEL, COMPREHENSIVE   Result Value Ref Range    Sodium 143 136 - 145 mmol/L    Potassium 3.8 3.5 - 5.5 mmol/L    Chloride 111 100 - 111 mmol/L    CO2 28 21 - 32 mmol/L    Anion gap 4 3.0 - 18  mmol/L    Glucose 106 (H) 74 - 99 mg/dL    BUN 25 (H) 7.0 - 18 MG/DL    Creatinine 1.28 0.6 - 1.3 MG/DL    BUN/Creatinine  ratio 20 12 - 20      GFR est AA >60 >60 ml/min/1.18m    GFR est non-AA 55 (L) >60 ml/min/1.762m   Calcium 8.3 (L) 8.5 - 10.1 MG/DL    Bilirubin, total 0.8 0.2 - 1.0 MG/DL    ALT (SGPT) 21 16 - 61 U/L    AST (SGOT) 12 10 - 38 U/L    Alk. phosphatase 66 45 - 117 U/L    Protein, total 6.4 6.4 - 8.2 g/dL    Albumin 3.8 3.4 - 5.0 g/dL    Globulin 2.6 2.0 - 4.0 g/dL    A-G Ratio 1.5 0.8 - 1.7     HEMOGLOBIN A1C W/O EAG   Result Value Ref Range    Hemoglobin A1c 5.3 4.2 - 5.6 %     We reviewed the patient's labs from the last several visits to point out trends in the numbers        Patient Active Problem List   Diagnosis Code   ??? CAD, history of inferior wall myocardial infarction/status post stenting of the RCA in September 2003/status post CABG X3 in January 2006/EF 45%. I25.10   ??? PVC's    ??? BPH with obstruction/lower urinary tract symptoms N40.1, N13.8   ??? Elevated prostate specific antigen (PSA) R97.20   ??? Hyperlipidemia E78.5   ??? Primary hypertension I10   ??? Impaired fasting glucose R73.01   ??? CKD (chronic kidney disease) stage 3, GFR 30-59 ml/min (HCC) N18.30   ??? Colon polyp K63.5     Assessment and plan:  1.  CHD.  F/U Dr ChBoyd Kerbs2.  BPH/LUTS, elev psa.  F/u PA Aloia  3.  HLP.  Continue current regimen. Check on vascepa coverage  4.  HTN.  Continue coreg, cozaar and amlo; he will check bp at other places and call in readings, we can adjust the amlo as indicated  5.  IFG.  Lifestyle and dietary measures. Further weight loss would be ideal  6.  CKD.  Improved, continue to follow  7.  Colon polyps.  Follow-up colonoscopy 2028 per GI?      RTC 2/22        Instructions above were handwritten on the lab results sheet that I gave the patient today    Above conditions discussed at length and patient vocalized understanding.  All questions answered to patient satisfaction

## 2019-10-01 NOTE — Telephone Encounter (Signed)
nt sure which machine to believe  pls sched pt with nurses to calibrate his machine to see if it's accurate

## 2019-10-01 NOTE — Telephone Encounter (Signed)
At appt on 08/23 bp was elevated DR advised pt to recheck at home it he said he went to [facility] to check 08/24 it was 171/84 he checked again when he returned home it was 124/76  Today he checked at home before taking meds   It was 156/88 after taking meds today 126/81  Please advise good contact today [phone]

## 2019-10-02 ENCOUNTER — Institutional Professional Consult (permissible substitution): Admit: 2019-10-02 | Discharge: 2019-10-02 | Payer: BLUE CROSS/BLUE SHIELD | Primary: Internal Medicine

## 2019-10-02 DIAGNOSIS — I10 Essential (primary) hypertension: Secondary | ICD-10-CM

## 2019-10-02 NOTE — Telephone Encounter (Signed)
Nurse appt scheduled for today.

## 2019-10-02 NOTE — Progress Notes (Signed)
Patient came into office today for BP check.  Patient's BP was 148/78 left arm and retaken 150/70 both BP were taken manually. Patient then took BP with electric monitor and BP read 168/75.  Patient complained BP cuff does not seem to give him an accurate BP reading.  Patient was advised to contact customer service for BP and ask for a replacement monitor because he is getting inaccurate readings.  Patient wanted to know if medication would be changed.  Patient was informed will send Dr. Dumaran a message and ask about medication adjustments, and call patient back.

## 2019-10-02 NOTE — Telephone Encounter (Signed)
Patient came into office today for BP check.  Patient's BP was 148/78 left arm and retaken 150/70 both BP were taken manually. Patient then took BP with electric monitor and BP read 168/75.  Patient complained BP cuff does not seem to give him an accurate BP reading.  Patient was advised to contact customer service for BP and ask for a replacement monitor because he is getting inaccurate readings.  Patient wanted to know if medication would be changed.  Patient was informed will send Dr. Tyler Pita a message and ask about medication adjustments, and call patient back.

## 2019-10-02 NOTE — Telephone Encounter (Signed)
Inc amlo to 5mg  (take 2 of the 2.5mg  tabs)  Call in readings 2-3 weeks  Will call in new script once we know final dosage

## 2019-10-03 NOTE — Telephone Encounter (Signed)
Patient is aware to increase Amlodipine to 5 mg (take 2 tabs of 2.5 mg).  Call office with reading 2-3 weeks. Provider will call in new script once final dose is known.  Patient verbalizes understanding.

## 2019-10-08 ENCOUNTER — Ambulatory Visit: Admit: 2019-10-08 | Discharge: 2019-10-08 | Attending: Physician Assistant | Primary: Internal Medicine

## 2019-10-08 ENCOUNTER — Ambulatory Visit: Attending: Physician Assistant | Primary: Internal Medicine

## 2019-10-08 DIAGNOSIS — R972 Elevated prostate specific antigen [PSA]: Secondary | ICD-10-CM

## 2019-10-08 LAB — AMB POC URINALYSIS DIP STICK AUTO W/O MICRO
Bilirubin (UA POC): NEGATIVE
Bilirubin, Urine, POC: NEGATIVE
Blood (UA POC): NEGATIVE
Blood (UA POC): NEGATIVE
Glucose (UA POC): NEGATIVE
Glucose, Urine, POC: NEGATIVE
Nitrite, Urine, POC: NEGATIVE
Nitrites (UA POC): NEGATIVE
Protein (UA POC): NEGATIVE
Protein, Urine, POC: NEGATIVE
Specific Gravity, Urine, POC: 1.02 NA (ref 1.001–1.035)
Specific gravity (UA POC): 1.02 (ref 1.001–1.035)
Urobilinogen (UA POC): 2 (ref 0.2–1)
Urobilinogen, POC: 2 (ref 0.2–1)
pH (UA POC): 7 (ref 4.6–8.0)
pH, Urine, POC: 7 NA (ref 4.6–8.0)

## 2019-10-08 LAB — AMB POC PVR, MEAS,POST-VOID RES,US,NON-IMAGING
PVR POC: 5 cc
PVR: 5 cc

## 2019-10-08 NOTE — Progress Notes (Signed)
Progress Notes by London Sheer, PA at 10/08/19 1320                Author: London Sheer, PA  Service: --  Author Type: Physician Assistant       Filed: 10/08/19 1354  Encounter Date: 10/08/2019  Status: Signed          Editor: London Sheer, PA (Physician Assistant)                                    Raymond [name]   1945-02-20                      ICD-10-CM  ICD-9-CM             1.  Elevated PSA   R97.20  790.93  AMB POC URINALYSIS DIP STICK AUTO W/O MICRO                AMB POC PVR, MEAS,POST-VOID RES,US,NON-IMAGING           2.  BPH without obstruction/lower urinary tract symptoms   N40.0  600.00  AMB POC URINALYSIS DIP STICK AUTO W/O MICRO                AMB POC PVR, MEAS,POST-VOID RES,US,NON-IMAGING           Assessment and Plan:   UA today negative   DRE 04/15/19 50 grams, anodular   PVR today 5 cc      1. BPH wo LUTS    No medical therapy     PVR today: 0 cc       2. Elevated PSA    Reviewed PSA from 07/09/2019 - 3.89ng/mL    DRE today 50 grams         Patient has undulating PSA. PSA has decreased.      RTC in 1 year with PSA prior and DRE            BMI 28.21. Patient's BMI is out of the normal parameters.  Information about BMI was given and patient was advised to follow-up with their PCP for further management.         Discussion   We discussed the options for management of LUTS/Benign Prostatic Hyperplasia;  including watchful waiting, over the counter supplements, medical therapy, minimally invasive therapies, laser therapy  and transurethral resection/coagulative therapies.        Watchful Waiting: risks and benefits of this approach were discussed including the fact that BPH is usually a progressive disease and can lead to infection, bladder stones, hematuria, incontinence, bladder damage and kidney damage but not limited to these  conditions. The need for on-going monitoring, including PSA, DRE, Flow, Bladder scan, and prostate ultrasound.       Alternative therapies: risks and benefits of  medical therapy using saw palmetto, prostate formulas and various over the counter therapies.      Minimally Invasive Therapies: risks and benefits of various therapies including but not limited to TransUrethral Microwave Therapy (TUMT), Injection therapies and others.      Laser Therapies: risks and benefits of GreenLight Laser Photoselective Vaporization of the Prostate (PVP), Holmium Laser Ablation of the Prostate (HoLAP), and Holmium Laser Enucleation of the Prostate (HoLEP).       Transurethral resection/Coagulative Therapies and PlasmaButton procedure: risks and benefits of these therapies reviewed as well.       The risks and  benefits of each option were reviewed in great detail. All questions answered.           Chief Complaint       Patient presents with        ?  Elevated PSA     ?  Benign Prostatic Hypertrophy        ?  (LUTS) Lower Urinary Tract Symptoms              HISTORY OF PRESENT ILLNESS:       Raymond MornDavid [name] is a 74 y.o.  male who presents today in follow up for elevated PSA of 5.3ng/mL from 02/10/2019. History of undulating PSA. Most recent PSA is 3.89 from 07/09/19.       Today, the patient is doing well.    He is not currently taking any GU  Medications, and has no urinary complaints.       No new bone or back pain   No unintentional weight loss   Good appetite      Denies flank pain, gross hematuria, dysuria, asymptomatic for infection. No f/c/n/v.       No family history of prostate, bladder or kidney cancer   Never smoker   Worked in Clinical research associatenuclear field in [employer]            No flowsheet data found.         AUA Assessment Score:    ;     AUA Bother Rating:                 Past Medical History:        Diagnosis  Date         ?  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  BPH with obstruction/lower urinary tract symptoms       ?  CABG x 3  02/2004     ?  Cardiac cath  02/29/2004          oRCA 100%.  LM patent.  p/mLAD 85%.  D1 90%.  D2 90%.  CX 45%.  LVEDP 12 mmHg.  EF 45%.   CABG recommended.         ?  CKD (chronic kidney disease) stage 3, GFR 30-59 ml/min (HCC)            being followed. borderline         ?  Colon polyp  05/09/2019          Dr Hart RochesterLawson         ?  Coronary artery disease            IMI w stent of RCA 9/03; s/p CABG X3 1/16         ?  Diastasis recti       ?  Dyslipidemia       ?  Elevated prostate specific antigen (PSA)       ?  H/O cardiovascular stress test  04/11/2013          Intermediate risk.  Mild-mod basal inferior infarction w/mild-mod peri-infarct ischemia in mid inferior, prox & mid inferolateral walls.  EF unreadable  due to gating error.  Neg EKG on max EST.  Ex time 6 min.           ?  H/O echocardiogram            LVE.  EF 45-50%.  Inferior, inferolateral WMA (10/07)         ?  Hypertension       ?  IFG (impaired fasting glucose)       ?  Microscopic hematuria       ?  Pleural effusion on right       ?  Right renal cyst       ?  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  SCC (squamous cell carcinoma)           ?  Ventricular tachycardia, nonsustained [facility])               Past Surgical History:         Procedure  Laterality  Date          ?  COLONOSCOPY  N/A  05/09/2019          cologuard+ 1/21; Dr Hart Rochester hyperplastic polyp          ?  HX CORONARY ARTERY BYPASS GRAFT    03/03/04          LIMA to the LAD. Sequential SVG to the diagonal and obtuse marginal branch          ?  HX CORONARY STENT PLACEMENT    10/12/01          RCA stented using 3.5 x 13 mm Express 2 stent          ?  HX OTHER SURGICAL    2015          s/p resection neurofibroma RLE          ?  HX OTHER SURGICAL    2015          R kidney mass removal          ?  HX TONSILLECTOMY                  Family History         Problem  Relation  Age of Onset          ?  Heart Disease  Father                x3 heart bypass          ?  Arthritis-osteo  Mother            ?  Hypertension  Sister               Current Outpatient Medications           Medication  Sig  Dispense  Refill           ?  amLODIPine (NORVASC) 2.5 mg tablet  Take 1 Tablet by mouth daily.  90 Tablet  1     ?  carvediloL (COREG) 12.5 mg tablet  Take 1 Tab by mouth two (2) times daily (with meals).  180 Tab  3     ?  icosapent ethyL (VASCEPA) 1 gram capsule  Take 2 Caps by mouth two (2) times daily (with meals).  120 Cap  11     ?  losartan (COZAAR) 50 mg tablet  TAKE 1 TABLET BY MOUTH TWICE DAILY  60 Tab  11     ?  omega-3 acid ethyl esters (LOVAZA) 1 gram  capsule  Take 1 Cap by mouth daily.  90 Cap  3     ?  simvastatin (ZOCOR) 40 mg tablet  Take 1 Tab by mouth nightly.  90 Tab  3     ?  triamcinolone (NASACORT AQ) 55 mcg nasal inhaler  INSTILL 2 SPRAYS INTO EACH NOSTRIL DAILY  17 g  8     ?  aspirin 81 mg tablet  Take 81 mg by mouth daily.               ?  MULTIVITAMIN PO  Take 1 Tab by mouth daily.               Allergies:     Allergies        Allergen  Reactions         ?  Ace Inhibitors  Unknown (comments) and Cough             Intolerance b/c of dry coughing         ?  Bee Sting [Sting, Bee]  Hives             Social History          Socioeconomic History         ?  Marital status:  MARRIED              Spouse name:  Not on file         ?  Number of children:  Not on file     ?  Years of education:  Not on file     ?  Highest education level:  Not on file       Occupational History        ?  Not on file       Tobacco Use         ?  Smoking status:  Never Smoker     ?  Smokeless tobacco:  Never Used       Vaping Use         ?  Vaping Use:  Never used       Substance and Sexual Activity         ?  Alcohol use:  Yes             Comment: social         ?  Drug use:  No     ?  Sexual activity:  Not on file        Other Topics  Concern        ?  Not on file       Social History Narrative        ?  Not on file          Social Determinants of Health          Financial Resource Strain:         ?  Difficulty of Paying Living Expenses:        Food Insecurity:         ?  Worried About Patent examiner in the Last Year:      ?  Barista in the Last Year:        Transportation Needs:         ?  Freight forwarder (Medical):      ?  Lack of Transportation (Non-Medical):        Physical Activity:         ?  Days of Exercise per Week:      ?  Minutes of Exercise per Session:        Stress:         ?  Feeling of Stress :        Social Connections:         ?  Frequency of Communication with Friends and Family:      ?  Frequency of Social Gatherings with Friends and Family:      ?  Attends Religious Services:      ?  Active Member of Clubs or Organizations:      ?  Attends Banker Meetings:      ?  Marital Status:        Intimate Partner Violence:         ?  Fear of Current or Ex-Partner:      ?  Emotionally Abused:      ?  Physically Abused:         ?  Sexually Abused:               Review of Systems   Constitutional: Fever: No   Skin: Rash: No   HEENT: Hearing difficulty: No   Eyes: Blurred vision: No   Cardiovascular: Chest pain: No   Respiratory: Shortness of breath: No   Gastrointestinal: Nausea/vomiting: No   Musculoskeletal: Back pain: No   Neurological: Weakness: No   Psychological: Memory loss: No   Comments/additional findings:          PHYSCIAL EXAMINATION:       Visit Vitals      Temp  97.4 ??F (36.3 ??C)     Ht  5\' 9"  (1.753 m)     Wt  188 lb (85.3 kg)        BMI  27.76 kg/m??        Constitutional: WDWN, Pleasant and appropriate affect, No acute distress.     CV:  No peripheral swelling noted   Respiratory: No respiratory distress or difficulties   Abdomen:  No abdominal distension noted   GU Male:  04/15/19   06/15/19 normal to visual inspection, no erythema or irritation, Sphincter with good tone, Rectum with no hemorrhoids,  fissures or masses.  Prostate is 50 grams. Anodular and smooth.    Skin: No jaundice.     Neuro/Psych:  Alert and oriented x 3. Affect appropriate.    LE: No edema      REVIEW OF LABS AND IMAGING:          Results for orders placed or performed in  visit on 10/08/19     AMB POC URINALYSIS DIP STICK AUTO W/O MICRO         Result  Value  Ref Range            Color (UA POC)  Yellow         Clarity (UA POC)  Clear         Glucose (UA POC)  Negative  Negative       Bilirubin (UA POC)  Negative  Negative       Ketones (UA POC)  Trace  Negative       Specific gravity (UA POC)  1.020  1.001 - 1.035       Blood (UA POC)  Negative  Negative       pH (UA POC)  7.0  4.6 - 8.0       Protein (UA  POC)  Negative  Negative       Urobilinogen (UA POC)  2 mg/dL  0.2 - 1       Nitrites (UA POC)  Negative  Negative       Leukocyte esterase (UA POC)  Trace  Negative       AMB POC PVR, MEAS,POST-VOID RES,US,NON-IMAGING         Result  Value  Ref Range            PVR  5  cc                Lab Results         Component  Value  Date/Time            Prostate Specific Ag  3.89  07/09/2019 02:58 PM       Prostate Specific Ag  4.87 (H)  04/15/2019 04:51 PM       Prostate Specific Ag  5.3 (H)  02/10/2019 11:00 AM       Prostate Specific Ag  2.9  09/10/2017 09:35 AM            Prostate Specific Ag  3.0  09/11/2016 09:55 AM              A copy of today's office visit with all pertinent imaging results and labs were sent to the referring physician,Dumaran, Murvin Donning, MD         London Sheer, PA       London Sheer, PA-C   [facility]    [address] [place], [address]   [place], [place] [address]   P: [phone]    F: [phone]

## 2019-10-15 ENCOUNTER — Encounter

## 2019-10-16 MED ORDER — AMLODIPINE 5 MG TAB
5 mg | ORAL_TABLET | Freq: Every day | ORAL | 3 refills | Status: DC
Start: 2019-10-16 — End: 2020-10-03

## 2019-10-16 NOTE — Telephone Encounter (Signed)
Telephone Encounter by Bennie Hind, MD at 10/16/19 0845                Author: Bennie Hind, MD  Service: --  Author Type: Physician       Filed: 10/16/19 0845  Encounter Date: 10/15/2019  Status: Signed          Editor: Bennie Hind, MD (Physician)          From: Lenore Cordia: Bennie Hind, MDSent: 10/15/2019 12:26 PM EDTSubject: Non-Urgent Medical QuestionI have attached further blood pressure readings:DATE  TIME               BP PULSE ARM notes31-Aug 10:00 AM 124 /69 61  L 1-Sep 11:45 AM 123/78 56  L 2-Sep    9:10 AM 114 /74 64  L         NEW MONITOR3-Sep 10:20  AM 131 /79 61  L 4-Sep 11:30 AM 109/71 59  L 5-Sep   9:30 AM 122/74 58  L 6-Sep 11:50 AM 129/76 54  L 7-Sep 10:15 AM 127/83 56  L 8-Sep  10:20 AM 115 /74 61  L Also, I will need a new prescription for Amlodipine Besylate 5mg  since my supply of 2.5mg  is about to end early next week.

## 2019-10-23 ENCOUNTER — Encounter: Attending: Interventional Cardiology | Primary: Internal Medicine

## 2019-10-27 MED ORDER — SIMVASTATIN 40 MG TAB
40 mg | ORAL_TABLET | ORAL | 3 refills | Status: DC
Start: 2019-10-27 — End: 2020-10-20

## 2019-10-31 ENCOUNTER — Ambulatory Visit
Admit: 2019-10-31 | Discharge: 2019-10-31 | Payer: BLUE CROSS/BLUE SHIELD | Attending: Interventional Cardiology | Primary: Internal Medicine

## 2019-10-31 ENCOUNTER — Ambulatory Visit: Attending: Interventional Cardiology | Primary: Internal Medicine

## 2019-10-31 DIAGNOSIS — R001 Bradycardia, unspecified: Secondary | ICD-10-CM

## 2019-10-31 NOTE — Progress Notes (Signed)
 Raymond [name] presents today for   Chief Complaint   Patient presents with   . Follow-up     6 months        [place] preferred language for health care discussion is english/other.    Is someone accompanying this pt? no    Is the patient using any DME equipment during OV? no    Depression Screening:  3 most recent PHQ Screens 10/31/2019   Little interest or pleasure in doing things Not at all   Feeling down, depressed, irritable, or hopeless Not at all   Total Score PHQ 2 0       Learning Assessment:  Learning Assessment 09/11/2013   PRIMARY LEARNER Patient   HIGHEST LEVEL OF EDUCATION - PRIMARY LEARNER  -   BARRIERS PRIMARY LEARNER -   CO-LEARNER CAREGIVER -   PRIMARY LANGUAGE ENGLISH   INTERPRETER NEED -   LEARNER PREFERENCE PRIMARY LISTENING   ANSWERED BY patient   RELATIONSHIP SELF       Abuse Screening:  Abuse Screening Questionnaire 09/29/2019   Do you ever feel afraid of your partner? N   Are you in a relationship with someone who physically or mentally threatens you? N   Is it safe for you to go home? Y       Fall Risk  Fall Risk Assessment, last 12 mths 10/31/2019   Able to walk? Yes   Fall in past 12 months? 0   Do you feel unsteady? 0   Are you worried about falling 0       Pt currently taking Anticoagulant therapy? ASA 81mg  every day     Coordination of Care:  1. Have you been to the ER, urgent care clinic since your last visit? Hospitalized since your last visit? no    2. Have you seen or consulted any other health care providers outside of the [facility] since your last visit? Include any pap smears or colon screening. no

## 2019-10-31 NOTE — Progress Notes (Signed)
HISTORY OF PRESENT ILLNESS  Raymond [name] is a 74 y.o. male.    ASSESSMENT and PLAN    [name], previous Dr. Junius Finner patient, has history of CAD. ??Back in January 2006, he presented with sinking feeling and nonsustained VT. ??Ultimately, he underwent coronary artery bypass graft surgery with LIMA to LAD as well as sequential SVG to diagonal and OM branch. ??Since that time, he has had nuclear scan in 2012 which showed EF of 55% with fixed inferior defect. ??His nuclear scan in February 2018 showed inferior fixed defect without ischemia. ??His EF was noted to be 52%. ??Because of bradycardia, he did have Holter monitor in February 2018 which showed baseline sinus bradycardia without symptoms. ??His minimum heart rate was 41 bpm.  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  He does have history hypertension, and hyperlipidemia. ??He denies active tobacco use. ??He has chronic renal disease with his last creatinine in July 2020 to be about 1.65 from baseline of 1.36. ??Diuretic regimen was decreased at that time.    ?? CAD:    Clinically stable.  ?? BP:    Upper normal to mildly elevated.  He states that his blood pressure at home on his new machine is quite well controlled.  Again, I have asked him to bring in his home machine for correlation with also with his PCP.  I will defer his blood pressure control to his PCP.  If there are questions, I am certainly available.  ?? Rhythm:    Asymptomatic sinus bradycardia.  However, his heart rate is slowing with increasing first-degree heart block.  If he develops irreversible symptomatic bradycardia, he will need permanent pacemaker insertion.  ?? CHF:    There is no evidence of decompensated CHF noted.  ?? Weight:     His weight today is 190 pounds.  This is near his baseline weight.  ?? Cholesterol:   Target LDL <70.    Zocor 40.  ?? Anti-platelet:   Remains on ASA.    I will see him back in 6  months. ??Thank you.      Encounter Diagnoses   Name Primary?   ??? Bradycardia Yes   ??? Atherosclerosis of native coronary artery of native heart without angina pectoris    ??? Nonsustained ventricular tachycardia (HCC)    ??? Hypercholesteremia      current treatment plan is effective, no change in therapy  lab results and schedule of future lab studies reviewed with patient  reviewed diet, exercise and weight control  cardiovascular risk and specific lipid/LDL goals reviewed  use of aspirin to prevent MI and TIA's discussed      HPI   Today, Raymond [name] has no complaints of chest pains, increased shortness of breath or decreased exercise capacity.  He denies any orthopnea or PND.  He denies any palpitations or dizziness.  He remains quite active physically.  He denies any exertional dizziness.  He pushed mows his lawn 2 days a week.  He has not had any issues with physical exertion.  He denies any orthopnea or PND.  Denies any palpitations or dizziness.    Review of Systems   Respiratory: Negative for shortness of breath.    Cardiovascular: Negative for chest pain, palpitations, orthopnea, claudication, leg swelling and PND.   All other systems reviewed and are negative.      Physical Exam  Vitals  and nursing note reviewed.   Constitutional:       Appearance: Normal appearance.   HENT:      Head: Normocephalic.   Eyes:      Conjunctiva/sclera: Conjunctivae normal.   Neck:      Vascular: No carotid bruit.   Cardiovascular:      Rate and Rhythm: Regular rhythm. Bradycardia present.   Pulmonary:      Breath sounds: Normal breath sounds.   Abdominal:      Palpations: Abdomen is soft.   Musculoskeletal:         General: No swelling.      Cervical back: No rigidity.   Skin:     General: Skin is warm and dry.   Neurological:      General: No focal deficit present.      Mental Status: He is alert and oriented to person, place, and time.   Psychiatric:         Mood and Affect: Mood normal.         Behavior: Behavior normal.          PCP: Bennie Hind, MD    Past Medical History:   Diagnosis Date   ??? 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 BPH with obstruction/lower urinary tract symptoms    ??? CABG x 3 02/2004   ??? Cardiac cath 02/29/2004    oRCA 100%.  LM patent.  p/mLAD 85%.  D1 90%.  D2 90%.  CX 45%.  LVEDP 12 mmHg.  EF 45%.  CABG recommended.   ??? CKD (chronic kidney disease) stage 3, GFR 30-59 ml/min (HCC)     being followed. borderline   ??? Colon polyp 05/09/2019    Dr Hart Rochester   ??? Coronary artery disease     IMI w stent of RCA 9/03; s/p CABG X3 1/16   ??? Diastasis recti    ??? Dyslipidemia    ??? Elevated prostate specific antigen (PSA)    ??? H/O cardiovascular stress test 04/11/2013    Intermediate risk.  Mild-mod basal inferior infarction w/mild-mod peri-infarct ischemia in mid inferior, prox & mid inferolateral walls.  EF unreadable due to gating error.  Neg EKG on max EST.  Ex time 6 min.     ??? H/O echocardiogram     LVE.  EF 45-50%.  Inferior, inferolateral WMA (10/07)   ??? Hypertension    ??? IFG (impaired fasting glucose)    ??? Microscopic hematuria    ??? Pleural effusion on right    ??? Right renal cyst    ??? 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 SCC (squamous cell carcinoma)    ??? Ventricular tachycardia, nonsustained [facility])        Past Surgical History:   Procedure Laterality Date   ??? COLONOSCOPY N/A 05/09/2019    cologuard+ 1/21; Dr Hart Rochester hyperplastic polyp   ??? HX CORONARY ARTERY BYPASS GRAFT  03/03/04    LIMA to the LAD. Sequential SVG to the diagonal and obtuse marginal branch   ??? HX CORONARY STENT PLACEMENT  10/12/01    RCA stented using 3.5 x 13 mm Express 2 stent   ??? HX OTHER SURGICAL  2015    s/p resection neurofibroma RLE   ??? HX OTHER SURGICAL  2015    R kidney mass removal   ??? HX TONSILLECTOMY  Current Outpatient Medications   Medication Sig Dispense Refill   ??? simvastatin (ZOCOR) 40 mg tablet TAKE 1 TABLET BY MOUTH NIGHTLY 90 Tablet 3   ??? amLODIPine  (NORVASC) 5 mg tablet Take 1 Tablet by mouth daily. 90 Tablet 3   ??? carvediloL (COREG) 12.5 mg tablet Take 1 Tab by mouth two (2) times daily (with meals). 180 Tab 3   ??? icosapent ethyL (VASCEPA) 1 gram capsule Take 2 Caps by mouth two (2) times daily (with meals). 120 Cap 11   ??? losartan (COZAAR) 50 mg tablet TAKE 1 TABLET BY MOUTH TWICE DAILY 60 Tab 11   ??? triamcinolone (NASACORT AQ) 55 mcg nasal inhaler INSTILL 2 SPRAYS INTO EACH NOSTRIL DAILY 17 g 8   ??? aspirin 81 mg tablet Take 81 mg by mouth daily.     ??? MULTIVITAMIN PO Take 1 Tab by mouth daily.         The patient has a family history of    Social History     Tobacco Use   ??? Smoking status: Never Smoker   ??? Smokeless tobacco: Never Used   Vaping Use   ??? Vaping Use: Never used   Substance Use Topics   ??? Alcohol use: Yes     Comment: social   ??? Drug use: No       Lab Results   Component Value Date/Time    Cholesterol, total 125 09/22/2019 10:34 AM    HDL Cholesterol 63 (H) 09/22/2019 10:34 AM    LDL, calculated 53 09/22/2019 10:34 AM    Triglyceride 45 09/22/2019 10:34 AM    CHOL/HDL Ratio 2.0 09/22/2019 10:34 AM        BP Readings from Last 3 Encounters:   10/31/19 (!) 150/80   09/29/19 (!) 142/78   05/19/19 (!) 160/70        Pulse Readings from Last 3 Encounters:   10/31/19 (!) 51   09/29/19 (!) 53   05/19/19 (!) 58       Wt Readings from Last 3 Encounters:   10/31/19 86.2 kg (190 lb)   10/08/19 85.3 kg (188 lb)   09/29/19 85.7 kg (189 lb)         EKG: unchanged from previous tracings, sinus bradycardia, RBBB, 1st degree AV block.

## 2020-03-22 MED ORDER — LOSARTAN 50 MG TAB
50 mg | ORAL_TABLET | ORAL | 11 refills | Status: AC
Start: 2020-03-22 — End: ?

## 2020-03-31 ENCOUNTER — Ambulatory Visit: Admit: 2020-03-31 | Discharge: 2020-03-31 | Payer: MEDICARE | Primary: Internal Medicine

## 2020-03-31 ENCOUNTER — Inpatient Hospital Stay: Admit: 2020-03-31 | Payer: BLUE CROSS/BLUE SHIELD | Primary: Internal Medicine

## 2020-03-31 DIAGNOSIS — I10 Essential (primary) hypertension: Secondary | ICD-10-CM

## 2020-03-31 LAB — CBC W/O DIFF
ABSOLUTE NRBC: 0 10*3/uL (ref 0.00–0.01)
HCT: 36.8 % (ref 36.0–48.0)
HGB: 12 g/dL — ABNORMAL LOW (ref 13.0–16.0)
MCH: 30.2 PG (ref 24.0–34.0)
MCHC: 32.6 g/dL (ref 31.0–37.0)
MCV: 92.7 FL (ref 78.0–100.0)
MPV: 9.8 FL (ref 9.2–11.8)
NRBC: 0 PER 100 WBC
PLATELET: 175 10*3/uL (ref 135–420)
RBC: 3.97 M/uL — ABNORMAL LOW (ref 4.35–5.65)
RDW: 14.2 % (ref 11.6–14.5)
WBC: 5.6 10*3/uL (ref 4.6–13.2)

## 2020-03-31 LAB — METABOLIC PANEL, BASIC
Anion gap: 5 mmol/L (ref 3.0–18)
BUN/Creatinine ratio: 20 (ref 12–20)
BUN: 25 MG/DL — ABNORMAL HIGH (ref 7.0–18)
CO2: 27 mmol/L (ref 21–32)
Calcium: 8.7 MG/DL (ref 8.5–10.1)
Chloride: 110 mmol/L (ref 100–111)
Creatinine: 1.23 MG/DL (ref 0.6–1.3)
GFR est AA: >60 mL/min/{1.73_m2} (ref >60)
GFR est non-AA: 57 mL/min/{1.73_m2} — ABNORMAL LOW (ref >60)
Glucose: 97 mg/dL (ref 74–99)
Potassium: 3.5 mmol/L (ref 3.5–5.5)
Sodium: 142 mmol/L (ref 136–145)

## 2020-03-31 LAB — HEMOGLOBIN A1C WITH EAG
Est. average glucose: 108 mg/dL
Hemoglobin A1c: 5.4 % (ref 4.2–5.6)

## 2020-03-31 LAB — BASIC METABOLIC PANEL
Anion Gap: 5 mmol/L (ref 3.0–18)
BUN: 25 MG/DL — ABNORMAL HIGH (ref 7.0–18)
Bun/Cre Ratio: 20 (ref 12–20)
CO2: 27 mmol/L (ref 21–32)
Calcium: 8.7 MG/DL (ref 8.5–10.1)
Chloride: 110 mmol/L (ref 100–111)
Creatinine: 1.23 MG/DL (ref 0.6–1.3)
EGFR IF NonAfrican American: 57 mL/min/{1.73_m2} — ABNORMAL LOW (ref >60)
GFR African American: >60 mL/min/{1.73_m2} (ref >60)
Glucose: 97 mg/dL (ref 74–99)
Potassium: 3.5 mmol/L (ref 3.5–5.5)
Sodium: 142 mmol/L (ref 136–145)

## 2020-03-31 LAB — CBC
Hematocrit: 36.8 % (ref 36.0–48.0)
Hemoglobin: 12 g/dL — ABNORMAL LOW (ref 13.0–16.0)
MCH: 30.2 PG (ref 24.0–34.0)
MCHC: 32.6 g/dL (ref 31.0–37.0)
MCV: 92.7 FL (ref 78.0–100.0)
MPV: 9.8 FL (ref 9.2–11.8)
NRBC Absolute: 0 10*3/uL (ref 0.00–0.01)
Nucleated RBCs: 0 PER 100 WBC
Platelets: 175 10*3/uL (ref 135–420)
RBC: 3.97 M/uL — ABNORMAL LOW (ref 4.35–5.65)
RDW: 14.2 % (ref 11.6–14.5)
WBC: 5.6 10*3/uL (ref 4.6–13.2)

## 2020-03-31 LAB — HEMOGLOBIN A1C W/EAG
Hemoglobin A1C: 5.4 % (ref 4.2–5.6)
eAG: 108 mg/dL

## 2020-04-07 ENCOUNTER — Ambulatory Visit: Admit: 2020-04-07 | Discharge: 2020-04-07 | Payer: MEDICARE | Attending: Internal Medicine | Primary: Internal Medicine

## 2020-04-07 ENCOUNTER — Encounter: Payer: BLUE CROSS/BLUE SHIELD | Primary: Internal Medicine

## 2020-04-07 ENCOUNTER — Inpatient Hospital Stay: Admit: 2020-04-07 | Payer: BLUE CROSS/BLUE SHIELD | Primary: Internal Medicine

## 2020-04-07 ENCOUNTER — Ambulatory Visit: Attending: Internal Medicine | Primary: Internal Medicine

## 2020-04-07 DIAGNOSIS — Z0000 Encounter for general adult medical examination without abnormal findings: Secondary | ICD-10-CM

## 2020-04-07 DIAGNOSIS — D649 Anemia, unspecified: Secondary | ICD-10-CM

## 2020-04-07 LAB — FERRITIN
Ferritin: 70 NG/ML (ref 8–388)
Ferritin: 70 NG/ML (ref 8–388)

## 2020-04-07 LAB — IRON PROFILE
Iron % saturation: 24 % (ref 20–50)
Iron: 70 ug/dL (ref 50–175)
TIBC: 293 ug/dL (ref 250–450)

## 2020-04-07 LAB — RETICULOCYTE COUNT
Retic Ct Pct: 1.6 % (ref 0.5–2.5)
Reticulocyte count: 1.6 % (ref 0.5–2.5)

## 2020-04-07 LAB — IRON AND TIBC
Iron Saturation: 24 % (ref 20–50)
Iron: 70 ug/dL (ref 50–175)
TIBC: 293 ug/dL (ref 250–450)

## 2020-04-07 NOTE — Progress Notes (Signed)
This is the Subsequent Medicare Annual Wellness Exam    I have reviewed the patient's medical history in detail and updated the computerized patient record.       Assessment/Plan   Education and counseling provided:  Are appropriate based on today's review and evaluation  End-of-Life planning (with patient's consent)  Influenza Vaccine  Prostate cancer screening tests (PSA, covered annually)  Colorectal cancer screening tests  Cardiovascular screening blood test  Diabetes screening test    1. Medicare annual wellness visit, subsequent  2. Anemia, unspecified type  -     FERRITIN; Future  -     IRON PROFILE; Future  -     PROTEIN ELECTROPHORESIS; Future  -     RETICULOCYTE COUNT; Future  3. B12 deficiency  -     VITAMIN B12 & FOLATE; Future  4. Atherosclerosis of native coronary artery of native heart without angina pectoris  5. Primary hypertension  6. Impaired fasting glucose  -     HEMOGLOBIN A1C WITH EAG; Future  7. BPH with obstruction/lower urinary tract symptoms  8. Hyperplastic colonic polyp, unspecified part of colon  9. Stage 3a chronic kidney disease (HCC)  -     METABOLIC PANEL, COMPREHENSIVE; Future  10. Hyperlipidemia, unspecified hyperlipidemia type  -     CBC W/O DIFF; Future  -     LIPID PANEL; Future  11. Advanced directives, counseling/discussion  -     ADVANCE CARE PLANNING FIRST 30 MINS       Depression Risk Factor Screening     3 most recent PHQ Screens 10/31/2019   Little interest or pleasure in doing things Not at all   Feeling down, depressed, irritable, or hopeless Not at all   Total Score PHQ 2 0       Alcohol & Drug Abuse Risk Screen    Do you average more than 1 drink per night or more than 7 drinks a week: No    In the past three months have you have had more than 4 drinks containing alcohol on one occasion: No          Functional Ability and Level of Safety    Hearing: Hearing is good.      Activities of Daily Living:  The home contains: no safety equipment.  Patient does total self  care      Ambulation: with no difficulty     Fall Risk:  Fall Risk Assessment, last 12 mths 10/31/2019   Able to walk? Yes   Fall in past 12 months? 0   Do you feel unsteady? 0   Are you worried about falling 0      Abuse Screen:  Patient is not abused       Cognitive Screening    Has your family/caregiver stated any concerns about your memory: no     Cognitive Screening: Normal - Mini Cog Test    Health Maintenance Due     Health Maintenance Due   Topic Date Due   ??? Eye Exam Retinal or Dilated  05/08/2013   ??? COVID-19 Vaccine (3 - Booster for Moderna series) 10/03/2019       Patient Care Team   Patient Care Team:  Bennie Hind, MD as PCP - General (Internal Medicine)  Bennie Hind, MD as PCP - [facility] Empaneled Provider  McKenzie, [place], MD as Physician (Vascular Surgery)    History     Patient Active Problem List   Diagnosis Code   ???  CAD, history of inferior wall myocardial infarction/status post stenting of the RCA in September 2003/status post CABG X3 in January 2006/EF 45%. I25.10   ??? PVC's    ??? BPH with obstruction/lower urinary tract symptoms N40.1, N13.8   ??? Elevated prostate specific antigen (PSA) R97.20   ??? Hyperlipidemia E78.5   ??? Primary hypertension I10   ??? Impaired fasting glucose R73.01   ??? CKD (chronic kidney disease) stage 3, GFR 30-59 ml/min (HCC) N18.30   ??? Colon polyp K63.5     Past Medical History:   Diagnosis Date   ??? 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 BPH with obstruction/lower urinary tract symptoms    ??? CABG x 3 02/2004   ??? Cardiac cath 02/29/2004    oRCA 100%.  LM patent.  p/mLAD 85%.  D1 90%.  D2 90%.  CX 45%.  LVEDP 12 mmHg.  EF 45%.  CABG recommended.   ??? CKD (chronic kidney disease) stage 3, GFR 30-59 ml/min (HCC)     being followed. borderline   ??? Colon polyp 05/09/2019    Dr Hart Rochester   ??? Coronary artery disease     IMI w stent of RCA 9/03; s/p CABG X3 1/16   ??? Diastasis recti    ??? Dyslipidemia    ??? Elevated prostate specific antigen (PSA)    ??? H/O  cardiovascular stress test 04/11/2013    Intermediate risk.  Mild-mod basal inferior infarction w/mild-mod peri-infarct ischemia in mid inferior, prox & mid inferolateral walls.  EF unreadable due to gating error.  Neg EKG on max EST.  Ex time 6 min.     ??? H/O echocardiogram     LVE.  EF 45-50%.  Inferior, inferolateral WMA (10/07)   ??? Hypertension    ??? IFG (impaired fasting glucose)    ??? Microscopic hematuria    ??? Pleural effusion on right    ??? Right renal cyst    ??? 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 SCC (squamous cell carcinoma)    ??? Ventricular tachycardia, nonsustained [facility])       Past Surgical History:   Procedure Laterality Date   ??? COLONOSCOPY N/A 05/09/2019    cologuard+ 1/21; Dr Hart Rochester hyperplastic polyp   ??? HX CORONARY ARTERY BYPASS GRAFT  03/03/04    LIMA to the LAD. Sequential SVG to the diagonal and obtuse marginal branch   ??? HX CORONARY STENT PLACEMENT  10/12/01    RCA stented using 3.5 x 13 mm Express 2 stent   ??? HX OTHER SURGICAL  2015    s/p resection neurofibroma RLE   ??? HX OTHER SURGICAL  2015    R kidney mass removal   ??? HX TONSILLECTOMY       Current Outpatient Medications   Medication Sig Dispense Refill   ??? losartan (COZAAR) 50 mg tablet TAKE 1 TABLET BY MOUTH TWICE DAILY 60 Tablet 11   ??? simvastatin (ZOCOR) 40 mg tablet TAKE 1 TABLET BY MOUTH NIGHTLY 90 Tablet 3   ??? amLODIPine (NORVASC) 5 mg tablet Take 1 Tablet by mouth daily. 90 Tablet 3   ??? carvediloL (COREG) 12.5 mg tablet Take 1 Tab by mouth two (2) times daily (with meals). 180 Tab 3   ??? icosapent ethyL (VASCEPA) 1 gram capsule Take 2 Caps by mouth two (2) times daily (with meals). 120  Cap 11   ??? triamcinolone (NASACORT AQ) 55 mcg nasal inhaler INSTILL 2 SPRAYS INTO EACH NOSTRIL DAILY 17 g 8   ??? aspirin 81 mg tablet Take 81 mg by mouth daily.     ??? MULTIVITAMIN PO Take 1 Tab by mouth daily.       Allergies   Allergen Reactions   ??? Ace Inhibitors Unknown (comments) and Cough      Intolerance b/c of dry coughing   ??? Bee Sting [Sting, Bee] Hives       Family History   Problem Relation Age of Onset   ??? Heart Disease Father         x3 heart bypass   ??? OSTEOARTHRITIS Mother    ??? Hypertension Sister      Social History     Tobacco Use   ??? Smoking status: Never Smoker   ??? Smokeless tobacco: Never Used   Substance Use Topics   ??? Alcohol use: Yes     Comment: social         Thelmer Legler Rubie Maid, MD

## 2020-04-07 NOTE — ACP (Advance Care Planning) (Signed)
Advance Care Planning     Advance Care Planning (ACP) Physician/NP/PA Conversation      Date of Conversation: 04/07/2020  Conducted with: Patient with Decision Making Capacity    Healthcare Decision Maker:     Primary Decision Maker: Carraway,Gertrude pick up - Spouse - [phone]  Click here to complete [employer] including selection of the Healthcare Decision Maker Relationship (ie "Primary")        Today we documented Decision Maker(s) consistent with Legal Next of Kin hierarchy.    Care Preferences:    Hospitalization: "If your health worsens and it becomes clear that your chance of recovery is unlikely, what would be your preference regarding hospitalization?"  The patient would prefer hospitalization.    Ventilation: "If you were unable to breathe on your own and your chance of recovery was unlikely, what would be your preference about the use of a ventilator (breathing machine) if it was available to you?"   The patient would desire the use of a ventilator.    Resuscitation: "In the event your heart stopped as a result of an underlying serious health condition, would you want attempts to be made to restart your heart, or would you prefer a natural death?"   Yes, attempt to resuscitate.    Additional topics discussed: ventilation preferences, hospitalization preferences and resuscitation preferences    Conversation Outcomes / Follow-Up Plan:   ACP in process - information provided, considering goals and options  Reviewed DNR/DNI and patient elects Full Code (Attempt Resuscitation)     Length of Voluntary ACP Conversation in minutes:  16 minutes    Adaleen Hulgan Rubie Maid, MD

## 2020-04-07 NOTE — Addendum Note (Signed)
Addendum Note by Bennie Hind, MD at 04/07/20 0900                Author: Bennie Hind, MD  Service: --  Author Type: Physician       Filed: 08/02/20 1039  Encounter Date: 04/07/2020  Status: Signed          Editor: Bennie Hind, MD (Physician)          Addended by: Bennie Hind on: 08/02/2020 10:39 AM    Modules accepted: Orders

## 2020-04-07 NOTE — Progress Notes (Signed)
 Alm Sharps presents with   Chief Complaint   Patient presents with   . Follow-up     6 month   . Hypertension   . Cholesterol Problem   . Labs     03-31-20            1. Have you been to the ER, urgent care clinic since your last visit?  Hospitalized since your last visit? NO    2. Have you seen or consulted any other health care providers outside of the [facility] since your last visit? NO    3. For patients aged 76-75: Has the patient had a colonoscopy? Yes - no Care Gap present     If the patient is male:    4. For patients aged 30-74: Has the patient had a mammogram within the past 2 years? NA - based on age    19. For patients aged 21-65: Has the patient had a pap smear? NA - based on age

## 2020-04-07 NOTE — Progress Notes (Signed)
75 y.o. WHITE/NON-HISPANIC male who presents for evaluation.    No cardiovascular complaints.  He is now being followed by Dr Dory Peru. Tries to remain active although no set exercise program.  Does a lot of yardwork, bowls once weekly.  bp controlled when he checks    No gi issues currently.  Continues to see PA Aloia at [facility] for h/o elevated PSA although normalized of late. No bleeding to report from anywhere    Denies polyuria, polydipsia, nocturia, vision change.  Not checking sugars at this time. He's keeping the weight stable    He did something to his low back musculature while cutting some bushes a couple weeks back, sx almost resolved    *LAST MEDICARE WELLNESS EXAM: 09/10/15, 04/07/20    Past Medical History:   Diagnosis Date   ??? 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 BPH with obstruction/lower urinary tract symptoms    ??? CABG x 3 02/2004   ??? Cardiac cath 02/29/2004    oRCA 100%.  LM patent.  p/mLAD 85%.  D1 90%.  D2 90%.  CX 45%.  LVEDP 12 mmHg.  EF 45%.  CABG recommended.   ??? CKD (chronic kidney disease) stage 3, GFR 30-59 ml/min (HCC)     being followed. borderline   ??? Colon polyp 05/09/2019    Dr Hart Rochester   ??? Coronary artery disease     IMI w stent of RCA 9/03; s/p CABG X3 1/16   ??? Diastasis recti    ??? Dyslipidemia    ??? Elevated prostate specific antigen (PSA)    ??? H/O cardiovascular stress test 04/11/2013    Intermediate risk.  Mild-mod basal inferior infarction w/mild-mod peri-infarct ischemia in mid inferior, prox & mid inferolateral walls.  EF unreadable due to gating error.  Neg EKG on max EST.  Ex time 6 min.     ??? H/O echocardiogram     LVE.  EF 45-50%.  Inferior, inferolateral WMA (10/07)   ??? Hypertension    ??? IFG (impaired fasting glucose)    ??? Microscopic hematuria    ??? Pleural effusion on right    ??? Right renal cyst    ??? 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 SCC (squamous cell carcinoma)    ??? Ventricular tachycardia,  nonsustained [facility])      Past Surgical History:   Procedure Laterality Date   ??? COLONOSCOPY N/A 05/09/2019    cologuard+ 1/21; Dr Hart Rochester hyperplastic polyp   ??? HX CORONARY ARTERY BYPASS GRAFT  03/03/04    LIMA to the LAD. Sequential SVG to the diagonal and obtuse marginal branch   ??? HX CORONARY STENT PLACEMENT  10/12/01    RCA stented using 3.5 x 13 mm Express 2 stent   ??? HX OTHER SURGICAL  2015    s/p resection neurofibroma RLE   ??? HX OTHER SURGICAL  2015    R kidney mass removal   ??? HX TONSILLECTOMY       Social History     Socioeconomic History   ??? Marital status: MARRIED     Spouse name: Not on file   ??? Number of children: Not on file   ??? Years of education: Not on file   ??? Highest education level: Not on file   Occupational History   ??? Not on file   Tobacco Use   ???  Smoking status: Never Smoker   ??? Smokeless tobacco: Never Used   Vaping Use   ??? Vaping Use: Never used   Substance and Sexual Activity   ??? Alcohol use: Yes     Comment: social   ??? Drug use: No   ??? Sexual activity: Not on file   Other Topics Concern   ??? Not on file   Social History Narrative   ??? Not on file     Social Determinants of Health     Financial Resource Strain:    ??? Difficulty of Paying Living Expenses: Not on file   Food Insecurity:    ??? Worried About Running Out of Food in the Last Year: Not on file   ??? Ran Out of Food in the Last Year: Not on file   Transportation Needs:    ??? Lack of Transportation (Medical): Not on file   ??? Lack of Transportation (Non-Medical): Not on file   Physical Activity:    ??? Days of Exercise per Week: Not on file   ??? Minutes of Exercise per Session: Not on file   Stress:    ??? Feeling of Stress : Not on file   Social Connections:    ??? Frequency of Communication with Friends and Family: Not on file   ??? Frequency of Social Gatherings with Friends and Family: Not on file   ??? Attends Religious Services: Not on file   ??? Active Member of Clubs or Organizations: Not on file   ??? Attends Banker Meetings: Not on file    ??? Marital Status: Not on file   Intimate Partner Violence:    ??? Fear of Current or Ex-Partner: Not on file   ??? Emotionally Abused: Not on file   ??? Physically Abused: Not on file   ??? Sexually Abused: Not on file   Housing Stability:    ??? Unable to Pay for Housing in the Last Year: Not on file   ??? Number of Places Lived in the Last Year: Not on file   ??? Unstable Housing in the Last Year: Not on file     Current Outpatient Medications   Medication Sig   ??? losartan (COZAAR) 50 mg tablet TAKE 1 TABLET BY MOUTH TWICE DAILY   ??? simvastatin (ZOCOR) 40 mg tablet TAKE 1 TABLET BY MOUTH NIGHTLY   ??? amLODIPine (NORVASC) 5 mg tablet Take 1 Tablet by mouth daily.   ??? carvediloL (COREG) 12.5 mg tablet Take 1 Tab by mouth two (2) times daily (with meals).   ??? icosapent ethyL (VASCEPA) 1 gram capsule Take 2 Caps by mouth two (2) times daily (with meals).   ??? triamcinolone (NASACORT AQ) 55 mcg nasal inhaler INSTILL 2 SPRAYS INTO EACH NOSTRIL DAILY   ??? aspirin 81 mg tablet Take 81 mg by mouth daily.   ??? MULTIVITAMIN PO Take 1 Tab by mouth daily.     No current facility-administered medications for this visit.     Allergies   Allergen Reactions   ??? Ace Inhibitors Unknown (comments) and Cough     Intolerance b/c of dry coughing   ??? Bee Sting [Sting, Bee] Hives     REVIEW OF SYSTEMS: colo 4/21 Dr Hart Rochester  Ophtho ??? no vision change or eye pain  Oral ??? no mouth pain, tongue or tooth problems  Ears ??? no hearing loss, ear pain, fullness, no swallowing problems  Cardiac ??? no CP, PND, orthopnea, edema, palpitations or syncope  Chest ???  no breast masses  Resp ??? no wheezing, chronic coughing, dyspnea  GI ??? no heartburn, nausea, vomiting, change in bowel habits, bleeding, hemorrhoids  Urinary ??? no dysuria, hematuria, flank pain, urgency, frequency    Visit Vitals  BP 128/70   Pulse 60   Temp 97 ??F (36.1 ??C) (Temporal)   Resp 16   Ht 5\' 9"  (1.753 m)   Wt 189 lb (85.7 kg)   SpO2 99%   BMI 27.91 kg/m??     A&O x3  Affect is appropriate.  Mood  stable  No apparent distress  Anicteric, no JVD, adenopathy or thyromegaly.   No carotid bruits or radiated murmur  Lungs clear to auscultation, no wheezes or rales  Heart showed regular rate and rhythm. No murmur, rubs, gallops  Abdomen soft nontender, no hepatosplenomegaly or masses.  Diastasis noted  Extremities without edema.  Pulses 1-2+ symmetrically    LABS  From 2/16 showed gluc 101, cr 1.28, gfr 56,  alt 12,               chol 143, tg 65, hdl 61, ldl-c 69, wbc 5.0, hb 13.3, plt 175, tsh 2.94, ua neg  From 2/17 showed gluc 96,   cr 1.19, gfr>60, alt 29, hba1c 5.4, umar 5.0,  chol 170, tg 65, hdl 66, ldl-c 91, wbc 5.3, hb 13.6, plt 184, tsh 2.92, ua neg  From 2/18 showed gluc 104, cr 1.33, gfr 53,  alt 13, hba1c 5.6, umar 6.0,  chol 121, tg 51, hdl 58, ldl-c 53, wbc 5.2, hb 13.6, plt 177, tsh 3.09  From 8/18 showed gluc 104, cr 1.26, gfr 56,  alt 33,                chol 135, tg 58, hdl 61, ldl-c 62, wbc 6.0, hb 13.6, plt 180  From 8/19 showed gluc 97,   cr 1.36, gfr 51,     chol 134, tg 82, hdl 51, ldl-c 67,                psa 2.90  From 2/20 showed gluc 96,   cr 1.28, gfr 55,  alt 24,                chol 137, tg 66, hdl 57, ldl-c 67  From 7/20 showed gluc 93,   cr 1.67, gfr 40,  alt 27,     chol 151, tg 95, hdl 58, ldl-c 74  From 1/21 showed gluc 95,   cr 1.74, gfr 39,  alt 25, hba1c 5.5,   chol 134, tg 84, hdl 50, ldl-c 67, wbc 6.0, hb 12.9, plt 194,             psa 5.30  From 4/21 showed gluc 98,   cr 1.37, gfr 51  From 8/21 showed gluc 106, cr 1.28, gfr 55,  alt 21, hba1c 5.3   chol 125, tg 45, hdl 63, ldl-c 53    Results for orders placed or performed during the hospital encounter of 03/31/20   METABOLIC PANEL, BASIC   Result Value Ref Range    Sodium 142 136 - 145 mmol/L    Potassium 3.5 3.5 - 5.5 mmol/L    Chloride 110 100 - 111 mmol/L    CO2 27 21 - 32 mmol/L    Anion gap 5 3.0 - 18 mmol/L    Glucose 97 74 - 99 mg/dL    BUN 25 (H) 7.0 - 18 MG/DL    Creatinine 1.611.23  0.6 - 1.3 MG/DL    BUN/Creatinine  ratio 20 12 - 20      GFR est AA >60 >60 ml/min/1.65m2    GFR est non-AA 57 (L) >60 ml/min/1.41m2    Calcium 8.7 8.5 - 10.1 MG/DL   CBC W/O DIFF   Result Value Ref Range    WBC 5.6 4.6 - 13.2 K/uL    RBC 3.97 (L) 4.35 - 5.65 M/uL    HGB 12.0 (L) 13.0 - 16.0 g/dL    HCT 10.2 72.5 - 36.6 %    MCV 92.7 78.0 - 100.0 FL    MCH 30.2 24.0 - 34.0 PG    MCHC 32.6 31.0 - 37.0 g/dL    RDW 44.0 34.7 - 42.5 %    PLATELET 175 135 - 420 K/uL    MPV 9.8 9.2 - 11.8 FL    NRBC 0.0 0 PER 100 WBC    ABSOLUTE NRBC 0.00 0.00 - 0.01 K/uL   HEMOGLOBIN A1C WITH EAG   Result Value Ref Range    Hemoglobin A1c 5.4 4.2 - 5.6 %    Est. average glucose 108 mg/dL     We reviewed the patient's labs from the last several visits to point out trends in the numbers        Patient Active Problem List   Diagnosis Code   ??? CAD, history of inferior wall myocardial infarction/status post stenting of the RCA in September 2003/status post CABG X3 in January 2006/EF 45%. I25.10   ??? PVC's    ??? BPH with obstruction/lower urinary tract symptoms N40.1, N13.8   ??? Elevated prostate specific antigen (PSA) R97.20   ??? Hyperlipidemia E78.5   ??? Primary hypertension I10   ??? Impaired fasting glucose R73.01   ??? CKD (chronic kidney disease) stage 3, GFR 30-59 ml/min (HCC) N18.30   ??? Colon polyp K63.5     Assessment and plan:  1.  CHD.  F/U Dr Dory Peru  2.  BPH/LUTS, elev psa.  F/u PA Aloia  3.  HLP.  At target on current   4.  HTN.  Continue coreg, cozaar and amlo and controlled  5.  IFG.  Lifestyle and dietary measures. Further weight loss would be ideal  6.  CKD.  Improved, continue to follow  7.  Colon polyps.  Follow-up colonoscopy 2028 per GI?  8.  Anemia.  Proceed with evaluation after long discussion       RTC 9/22        Instructions above were handwritten on the lab results sheet that I gave the patient today    Above conditions discussed at length and patient vocalized understanding.  All questions answered to patient satisfaction        ICD-10-CM ICD-9-CM    1.  Anemia, unspecified type  D64.9 285.9 FERRITIN      IRON PROFILE      PROTEIN ELECTROPHORESIS      RETICULOCYTE COUNT   2. B12 deficiency  E53.8 266.2 VITAMIN B12 & FOLATE   3. Atherosclerosis of native coronary artery of native heart without angina pectoris  I25.10 414.01    4. Primary hypertension  I10 401.9    5. Impaired fasting glucose  R73.01 790.21 HEMOGLOBIN A1C WITH EAG   6. BPH with obstruction/lower urinary tract symptoms  N40.1 600.01     N13.8 599.69    7. Hyperplastic colonic polyp, unspecified part of colon  K63.5 211.3    8. Stage 3a chronic kidney disease (HCC)  N18.31 585.3 METABOLIC PANEL, COMPREHENSIVE   9. Hyperlipidemia, unspecified hyperlipidemia type  E78.5 272.4 CBC W/O DIFF      LIPID PANEL

## 2020-04-08 LAB — VITAMIN B12 & FOLATE
Folate: >20 ng/mL — ABNORMAL HIGH (ref 3.10–17.50)
Folate: >20 ng/mL — ABNORMAL HIGH (ref 3.10–17.50)
Vitamin B-12: 434 pg/mL (ref 211–911)
Vitamin B12: 434 pg/mL (ref 211–911)

## 2020-04-10 LAB — PROTEIN ELECTROPHORESIS
A/G Ratio: 1.6 (ref 0.7–1.7)
A/G ratio: 1.6 (ref 0.7–1.7)
ALPHA-1-GLOBULIN: 0.2 g/dL (ref 0.0–0.4)
ALPHA-2 GLOBULIN: 0.4 g/dL (ref 0.4–1.0)
Albumin: 3.9 g/dL (ref 2.9–4.4)
Albumin: 3.9 g/dL (ref 2.9–4.4)
AlpHa-2 Globulin: 0.4 g/dL (ref 0.4–1.0)
Alpha-1-globulin: 0.2 g/dL (ref 0.0–0.4)
BETA GLOBULIN: 0.8 g/dL (ref 0.7–1.3)
Beta globulin: 0.8 g/dL (ref 0.7–1.3)
GAMMA GLOBULIN: 1 g/dL (ref 0.4–1.8)
GLOBULIN,TOTAL, 011052: 2.4 g/dL (ref 2.2–3.9)
Gamma globulin: 1 g/dL (ref 0.4–1.8)
Globulin, total: 2.4 g/dL (ref 2.2–3.9)
Protein, total: 6.3 g/dL (ref 6.0–8.5)
Total Protein: 6.3 g/dL (ref 6.0–8.5)

## 2020-04-11 NOTE — Telephone Encounter (Signed)
pls call    Results for orders placed or performed during the hospital encounter of 04/07/20   FERRITIN   Result Value Ref Range    Ferritin 70 8 - 388 NG/ML   IRON PROFILE   Result Value Ref Range    Iron 70 50 - 175 ug/dL    TIBC 542 706 - 237 ug/dL    Iron % saturation 24 20 - 50 %   PROTEIN ELECTROPHORESIS   Result Value Ref Range    Protein, total 6.3 6.0 - 8.5 g/dL    Albumin 3.9 2.9 - 4.4 g/dL    SEGBT-5-VVOHYWVP 0.2 0.0 - 0.4 g/dL    ALPHA-2 GLOBULIN 0.4 0.4 - 1.0 g/dL    Beta globulin 0.8 0.7 - 1.3 g/dL    Gamma globulin 1.0 0.4 - 1.8 g/dL    M-Spike Not Observed Not Observed g/dL    Globulin, total 2.4 2.2 - 3.9 g/dL    A/G ratio 1.6 0.7 - 1.7     VITAMIN B12 & FOLATE   Result Value Ref Range    Vitamin B12 434 211 - 911 pg/mL    Folate >20.0 (H) 3.10 - 17.50 ng/mL   RETICULOCYTE COUNT   Result Value Ref Range    Reticulocyte count 1.6 0.5 - 2.5 %     All normal   Will just recheck at next draw

## 2020-04-12 NOTE — Telephone Encounter (Signed)
Called patient and left a message to call the office back.

## 2020-04-12 NOTE — Telephone Encounter (Signed)
Patient's wife reached and given information, verbalized understanding.

## 2020-04-23 ENCOUNTER — Ambulatory Visit
Admit: 2020-04-23 | Discharge: 2020-04-23 | Payer: BLUE CROSS/BLUE SHIELD | Attending: Interventional Cardiology | Primary: Internal Medicine

## 2020-04-23 ENCOUNTER — Ambulatory Visit: Attending: Interventional Cardiology | Primary: Internal Medicine

## 2020-04-23 DIAGNOSIS — R001 Bradycardia, unspecified: Secondary | ICD-10-CM

## 2020-04-23 NOTE — Progress Notes (Signed)
 Raymond [name] presents today for   Chief Complaint   Patient presents with   . Follow-up     6 month       Mung Rinker preferred language for health care discussion is english/other.    Is someone accompanying this pt? no    Is the patient using any DME equipment during OV? no    Depression Screening:  3 most recent PHQ Screens 04/23/2020   Little interest or pleasure in doing things Not at all   Feeling down, depressed, irritable, or hopeless Not at all   Total Score PHQ 2 0       Learning Assessment:  Learning Assessment 04/23/2020   PRIMARY LEARNER Patient   HIGHEST LEVEL OF EDUCATION - PRIMARY LEARNER  -   BARRIERS PRIMARY LEARNER -   CO-LEARNER CAREGIVER -   PRIMARY LANGUAGE ENGLISH   INTERPRETER NEED -   LEARNER PREFERENCE PRIMARY DEMONSTRATION   ANSWERED BY patient   RELATIONSHIP SELF       Abuse Screening:  Abuse Screening Questionnaire 04/23/2020   Do you ever feel afraid of your partner? N   Are you in a relationship with someone who physically or mentally threatens you? N   Is it safe for you to go home? Y       Fall Risk  Fall Risk Assessment, last 12 mths 04/23/2020   Able to walk? Yes   Fall in past 12 months? 0   Do you feel unsteady? 0   Are you worried about falling 0           Pt currently taking Anticoagulant therapy? no    Pt currently taking Antiplatelet therapy ? Aspirin 81 mg daily      Coordination of Care:  1. Have you been to the ER, urgent care clinic since your last visit? Hospitalized since your last visit? no    2. Have you seen or consulted any other health care providers outside of the [facility] since your last visit? Include any pap smears or colon screening. no

## 2020-04-23 NOTE — Progress Notes (Signed)
HISTORY OF PRESENT ILLNESS  Raymond [name] is a 75 y.o. male.    ASSESSMENT and PLAN    Raymond [name], previous??Dr. Junius Finner??patient,??has history of CAD. ??Back in January 2006, he presented with sinking feeling and nonsustained VT. ??Ultimately, he underwent coronary artery bypass graft surgery with LIMA to LAD as well as sequential SVG to diagonal and OM branch. ??Since that time, he has had nuclear scan in 2012 which showed EF of 55% with fixed inferior defect. ??His nuclear scan in February 2018 showed inferior fixed defect without ischemia. ??His EF was noted to be 52%. ??Because of bradycardia, he did have Holter monitor in February 2018 which showed baseline sinus bradycardia without symptoms. ??His minimum heart rate was 41 bpm.  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  He does have history hypertension, and hyperlipidemia. ??He denies active tobacco use. ??He has chronic renal disease with his last creatinine in July 2020 to be about 1.65 from baseline of 1.36. ??Diuretic regimen was decreased at that time.    ?? CAD:    He does have dyspnea on exertion.  However, he has not had any recurrent chest pains.  His bypass surgery was in 2006.  Repeat exercise nuclear scan has been requested.  ?? BP:    His blood pressure is upper normal at 142/82.  I will continue his medication regimen.  ?? Rhythm:    Symptomatic sinus bradycardia at 51 bpm.  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  The above exercise nuclear scan will show if there is chronotropic response to physical exertion.  Down the road, he may require permanent pacemaker insertion consideration.  ?? CHF:    There is no evidence of decompensated CHF noted.  ?? Weight:     His weight today is 192 pounds.  This is at baseline.  ?? Cholesterol:   Target LDL <70.    Managed by his PCP; he remains on Zocor 40.  ?? Anti-platelet:   Remains on ASA.    If the above testing is unremarkable,  I will see him back in 6 months. ??Thank you.    Encounter Diagnoses   Name Primary?   ??? Bradycardia Yes   ??? Atherosclerosis of native coronary artery of native heart without angina pectoris    ??? Nonsustained ventricular tachycardia (HCC)    ??? Hypercholesteremia    ??? SOB (shortness of breath)      current treatment plan is effective, no change in therapy  lab results and schedule of future lab studies reviewed with patient  reviewed diet, exercise and weight control  cardiovascular risk and specific lipid/LDL goals reviewed  use of aspirin to prevent MI and TIA's discussed      HPI   Today, Raymond [name] has no complaints of chest pains, increased shortness of breath or decreased exercise capacity.  However, he does have dyspnea on exertion.  Despite his age, he remains quite active physically.  He denies any orthopnea or PND.  He denies any palpitations or dizziness.    Review of Systems   Respiratory: Positive for shortness of breath.    Cardiovascular: Negative for chest pain, palpitations, orthopnea, claudication, leg swelling and PND.   All other systems reviewed and are negative.      Physical Exam  Vitals and nursing note reviewed.   Constitutional:       Appearance: Normal appearance.   HENT:  Head: Normocephalic.   Eyes:      Conjunctiva/sclera: Conjunctivae normal.   Neck:      Vascular: No carotid bruit.   Cardiovascular:      Rate and Rhythm: Regular rhythm. Bradycardia present.   Pulmonary:      Breath sounds: Normal breath sounds.   Abdominal:      Palpations: Abdomen is soft.   Musculoskeletal:         General: No swelling.      Cervical back: No rigidity.   Skin:     General: Skin is warm and dry.   Neurological:      General: No focal deficit present.      Mental Status: He is alert and oriented to person, place, and time.   Psychiatric:         Mood and Affect: Mood normal.         Behavior: Behavior normal.         PCP: Bennie Hind, MD    Past Medical History:   Diagnosis Date   ??? 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 BPH with obstruction/lower urinary tract symptoms    ??? CABG x 3 02/2004   ??? Cardiac cath 02/29/2004    oRCA 100%.  LM patent.  p/mLAD 85%.  D1 90%.  D2 90%.  CX 45%.  LVEDP 12 mmHg.  EF 45%.  CABG recommended.   ??? CKD (chronic kidney disease) stage 3, GFR 30-59 ml/min (HCC)     being followed. borderline   ??? Colon polyp 05/09/2019    Dr Hart Rochester   ??? Coronary artery disease     IMI w stent of RCA 9/03; s/p CABG X3 1/16   ??? Diastasis recti    ??? Dyslipidemia    ??? Elevated prostate specific antigen (PSA)    ??? H/O cardiovascular stress test 04/11/2013    Intermediate risk.  Mild-mod basal inferior infarction w/mild-mod peri-infarct ischemia in mid inferior, prox & mid inferolateral walls.  EF unreadable due to gating error.  Neg EKG on max EST.  Ex time 6 min.     ??? H/O echocardiogram     LVE.  EF 45-50%.  Inferior, inferolateral WMA (10/07)   ??? Hypertension    ??? IFG (impaired fasting glucose)    ??? Microscopic hematuria    ??? Pleural effusion on right    ??? Right renal cyst    ??? 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 SCC (squamous cell carcinoma)    ??? Ventricular tachycardia, nonsustained [facility])        Past Surgical History:   Procedure Laterality Date   ??? COLONOSCOPY N/A 05/09/2019    cologuard+ 1/21; Dr Hart Rochester hyperplastic polyp   ??? HX CORONARY ARTERY BYPASS GRAFT  03/03/04    LIMA to the LAD. Sequential SVG to the diagonal and obtuse marginal branch   ??? HX CORONARY STENT PLACEMENT  10/12/01    RCA stented using 3.5 x 13 mm Express 2 stent   ??? HX OTHER SURGICAL  2015    s/p resection neurofibroma RLE   ??? HX OTHER SURGICAL  2015    R kidney mass removal   ??? HX TONSILLECTOMY         Current Outpatient Medications   Medication Sig Dispense Refill   ??? losartan (COZAAR) 50 mg tablet  TAKE 1 TABLET BY MOUTH TWICE DAILY 60 Tablet 11   ??? simvastatin (ZOCOR) 40 mg tablet TAKE 1 TABLET BY MOUTH NIGHTLY 90 Tablet 3   ??? amLODIPine  (NORVASC) 5 mg tablet Take 1 Tablet by mouth daily. 90 Tablet 3   ??? carvediloL (COREG) 12.5 mg tablet Take 1 Tab by mouth two (2) times daily (with meals). 180 Tab 3   ??? icosapent ethyL (VASCEPA) 1 gram capsule Take 2 Caps by mouth two (2) times daily (with meals). 120 Cap 11   ??? triamcinolone (NASACORT AQ) 55 mcg nasal inhaler INSTILL 2 SPRAYS INTO EACH NOSTRIL DAILY 17 g 8   ??? aspirin 81 mg tablet Take 81 mg by mouth daily.     ??? MULTIVITAMIN PO Take 1 Tab by mouth daily.         The patient has a family history of    Social History     Tobacco Use   ??? Smoking status: Never Smoker   ??? Smokeless tobacco: Never Used   Vaping Use   ??? Vaping Use: Never used   Substance Use Topics   ??? Alcohol use: Yes     Comment: social   ??? Drug use: No       Lab Results   Component Value Date/Time    Cholesterol, total 125 09/22/2019 10:34 AM    HDL Cholesterol 63 (H) 09/22/2019 10:34 AM    LDL, calculated 53 09/22/2019 10:34 AM    Triglyceride 45 09/22/2019 10:34 AM    CHOL/HDL Ratio 2.0 09/22/2019 10:34 AM        BP Readings from Last 3 Encounters:   04/23/20 (!) 142/82   04/07/20 128/70   10/31/19 (!) 150/80        Pulse Readings from Last 3 Encounters:   04/23/20 (!) 51   04/07/20 60   10/31/19 (!) 51       Wt Readings from Last 3 Encounters:   04/23/20 87.1 kg (192 lb)   04/07/20 85.7 kg (189 lb)   10/31/19 86.2 kg (190 lb)         EKG: unchanged from previous tracings, sinus bradycardia, RBBB, 1st degree AV block.

## 2020-04-30 NOTE — Telephone Encounter (Signed)
Called and confirmed nuclear stress test

## 2020-05-03 ENCOUNTER — Ambulatory Visit: Admit: 2020-05-03 | Payer: BLUE CROSS/BLUE SHIELD | Primary: Internal Medicine

## 2020-05-03 ENCOUNTER — Ambulatory Visit

## 2020-05-03 DIAGNOSIS — R0602 Shortness of breath: Secondary | ICD-10-CM

## 2020-05-03 LAB — NUCLEAR CARDIAC STRESS TEST
Angina Index: 0
Baseline Diastolic BP: 80 mmHg
Baseline HR: 52 {beats}/min
Baseline ST Depression: 0 mm
Baseline Systolic BP: 162 mmHg
Exercise Duration Time: 8 min
Exercuse Duration Seconds: 10 s
Nuc Stress EF: 52 %
Recovery Stage 1 Duration: 2 min:sec
Recovery Stage 1 HR: 72 {beats}/min
Recovery Stage 2 Duration: 4 min:sec
Recovery Stage 2 HR: 63 {beats}/min
Stress Diastolic BP: 60 mmHg
Stress Estimated Workload: 3.8 METS
Stress Peak HR: 120 {beats}/min
Stress Percent HR Achieved: 83 %
Stress Rate Pressure Product: 23760 BPM*mmHg
Stress Stage 1 Duration: 3 min:sec
Stress Stage 1 HR: 94 {beats}/min
Stress Stage 2 Duration: 3 min:sec
Stress Stage 2 HR: 113 {beats}/min
Stress Stage 3 HR: 120 {beats}/min
Stress Systolic BP: 198 mmHg
Stress Target HR: 145 {beats}/min
TID: 0.96

## 2020-05-03 LAB — NM STRESS TEST WITH MYOCARDIAL PERFUSION
Angina Index: 0
Baseline Diastolic BP: 80 mmHg
Baseline HR: 52 {beats}/min
Baseline ST Depression: 0 mm
Baseline Systolic BP: 162 mmHg
Exercise Duration Time: 8 min
Exercuse Duration Seconds: 10 s
Left Ventricular Ejection Fraction: 52
Nuc Stress EF: 52 %
Recovery Stage 1 Duration: 2 min:sec
Recovery Stage 1 HR: 72 {beats}/min
Recovery Stage 2 Duration: 4 min:sec
Recovery Stage 2 HR: 63 {beats}/min
Stress Diastolic BP: 60 mmHg
Stress Estimated Workload: 3.8 METS
Stress Peak HR: 120 {beats}/min
Stress Percent HR Achieved: 83 %
Stress Rate Pressure Product: 23760 BPM*mmHg
Stress Stage 1 Duration: 3 min:sec
Stress Stage 1 HR: 94 {beats}/min
Stress Stage 2 Duration: 3 min:sec
Stress Stage 2 HR: 113 {beats}/min
Stress Stage 3 HR: 120 {beats}/min
Stress Systolic BP: 198 mmHg
Stress Target HR: 145 {beats}/min
TID: 0.96

## 2020-05-03 MED ORDER — TECHNETIUM TC-99M TETROFOSMIN 1.38 MG IV SOLUTION KIT
1.38 mg | Freq: Once | INTRAVENOUS | Status: AC
Start: 2020-05-03 — End: 2020-05-03
  Administered 2020-05-03: 12:00:00 via INTRAVENOUS

## 2020-05-03 NOTE — Progress Notes (Signed)
Patient was given 10.9 milliCuries of 90mTc-Myoview for the resting images.  Patient was also given 33.0 milliCuries of 48mTc-Myoview for the stress images.  Patient walked on treadmill during nuclear stress test.

## 2020-05-03 NOTE — Progress Notes (Signed)
Per your last office note:  Rhythm:    Symptomatic sinus bradycardia at 51 bpm.  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  The above exercise nuclear scan will show if there is chronotropic response to physical exertion.  Down the road, he may require permanent pacemaker insertion consideration

## 2020-05-14 ENCOUNTER — Encounter

## 2020-05-17 MED ORDER — ICOSAPENT ETHYL 1 GRAM CAPSULE
1 gram | ORAL_CAPSULE | Freq: Two times a day (BID) | ORAL | 11 refills | Status: AC
Start: 2020-05-17 — End: ?

## 2020-05-17 NOTE — Telephone Encounter (Signed)
Called pt to let them know that Jun Maryjo Rochester, MD read nuclear stress test  results and they were as follows:  ---- Message from Jun K Chung, MD sent at 05/11/2020  2:35 PM EDT -----  This appears to be unchanged from his 2018 scan.  Continue medical therapy.  Let the patient know that in comparison to his 2018 nuclear scan, no significant changes were noted.    No questions, content with call

## 2020-05-17 NOTE — Telephone Encounter (Signed)
 Last Visit: 04/07/20 with MD Dumaran  Next Appointment: 10/20/20 with MD Dumaran  Previous Refill Encounter(s): 05/19/19 #120 with 11 refills    Requested Prescriptions     Pending Prescriptions Disp Refills   . icosapent  ethyL (VASCEPA ) 1 gram capsule 120 Capsule 11     Sig: Take 2 Capsules by mouth two (2) times daily (with meals).

## 2020-06-07 MED ORDER — CARVEDILOL 12.5 MG TAB
12.5 mg | ORAL_TABLET | ORAL | 3 refills | Status: AC
Start: 2020-06-07 — End: ?

## 2020-07-30 NOTE — Telephone Encounter (Signed)
Patient was seen on 04/07/20 and was charged for ACP. Patient only has Medicare Part A. Please re-code and submit for billing.

## 2020-08-02 NOTE — Telephone Encounter (Signed)
pls remove charges for ACP

## 2020-08-02 NOTE — Telephone Encounter (Signed)
done

## 2020-09-30 ENCOUNTER — Institutional Professional Consult (permissible substitution): Admit: 2020-09-30 | Discharge: 2020-09-30 | Primary: Internal Medicine

## 2020-09-30 DIAGNOSIS — R972 Elevated prostate specific antigen [PSA]: Secondary | ICD-10-CM

## 2020-09-30 NOTE — Progress Notes (Signed)
Raymond [name] presents today for lab draw per PA ALoia's order.   Dr. Doreene Burke was present in the clinic as incident to.     PSA was obtained via venipuncture without any difficulty.    Patient will be notified with lab results.       Orders Placed This Encounter    PROSTATE SPECIFIC ANTIGEN, TOTAL (PSA)    COLLECTION VENOUS BLOOD,VENIPUNCTURE       Derrick Ravel

## 2020-10-01 LAB — PROSTATE SPECIFIC ANTIGEN, TOTAL (PSA)
PSA: 4.42 ng/mL — ABNORMAL HIGH (ref 0.00–4.00)
Prostate Specific Ag: 4.42 ng/mL — ABNORMAL HIGH (ref 0.00–4.00)

## 2020-10-03 ENCOUNTER — Encounter

## 2020-10-04 MED ORDER — AMLODIPINE 5 MG TAB
5 mg | ORAL_TABLET | Freq: Every day | ORAL | 3 refills | Status: AC
Start: 2020-10-04 — End: ?

## 2020-10-07 ENCOUNTER — Encounter: Attending: Physician Assistant | Primary: Internal Medicine

## 2020-10-08 ENCOUNTER — Ambulatory Visit: Admit: 2020-10-08 | Discharge: 2020-10-08 | Attending: Physician Assistant | Primary: Internal Medicine

## 2020-10-08 ENCOUNTER — Ambulatory Visit: Attending: Physician Assistant | Primary: Internal Medicine

## 2020-10-08 DIAGNOSIS — N400 Enlarged prostate without lower urinary tract symptoms: Secondary | ICD-10-CM

## 2020-10-08 NOTE — Progress Notes (Signed)
Progress Notes by London Sheer, PA at 10/08/20 1000                Author: London Sheer, PA  Service: --  Author Type: Physician Assistant       Filed: 10/08/20 1022  Encounter Date: 10/08/2020  Status: Signed          Editor: London Sheer, PA (Physician Assistant)                                    Raymond [name]   04-Jan-1946                      ICD-10-CM  ICD-9-CM             1.  BPH without obstruction/lower urinary tract symptoms   N40.0  600.00                    2.  Prostate cancer screening   Z12.5  V76.44                       Assessment and Plan:   No UA today      1. BPH wo LUTS    No medical therapy        2. Elevated PSA    Reviewed PSA from 09/30/20- 4.42   07/09/2019 - 3.89ng/mL   04/15/19- 4.87   02/10/19- 5.3    DRE 10/08/20: 50 grams         Patient has undulating PSA- stable, still below max for age range      RTC in 1 year with PSA prior and DRE            BMI 28.21. Patient's BMI is out of the normal parameters.  Information about BMI was given and patient was advised to follow-up with their PCP for further management.         Discussion   We discussed the options for management of LUTS/Benign Prostatic Hyperplasia;  including watchful waiting, over the counter supplements, medical therapy, minimally invasive therapies, laser therapy  and transurethral resection/coagulative therapies.        Watchful Waiting: risks and benefits of this approach were discussed including the fact that BPH is usually a progressive disease and can lead to infection, bladder stones, hematuria, incontinence, bladder damage and kidney damage but not limited to these  conditions. The need for on-going monitoring, including PSA, DRE, Flow, Bladder scan, and prostate ultrasound.       Alternative therapies: risks and benefits of medical therapy using saw palmetto, prostate formulas and various over the counter therapies.      Minimally Invasive Therapies: risks and benefits of various therapies including but not  limited to TransUrethral Microwave Therapy (TUMT), Injection therapies and others.      Laser Therapies: risks and benefits of GreenLight Laser Photoselective Vaporization of the Prostate (PVP), Holmium Laser Ablation of the Prostate (HoLAP), and Holmium Laser Enucleation of the Prostate (HoLEP).       Transurethral resection/Coagulative Therapies and PlasmaButton procedure: risks and benefits of these therapies reviewed as well.       The risks and benefits of each option were reviewed in great detail. All questions answered.           Chief Complaint       Patient presents with        ?  Elevated PSA                 HISTORY OF PRESENT ILLNESS:       Raymond [name] is a 75 y.o.  male who presents today in follow up for elevated PSA of 5.3ng/mL from 02/10/2019. History of undulating PSA. Most recent PSA is 4.42 from 09/30/20.       Today, the patient is doing well. He notes swelling in legs with increase amlodipine.He notes also drinking a cup of coffee at 9 PM   He is not currently taking any GU  Medications, and has no urinary complaints.       Frequency: q4-5 hrs   Nocturia: 1-2x   Urgency: only if he waits too long, no UUI   Feels like emptying bladder well   FOS: strong, weaker at night      No new bone or back pain. Old left hip pain   No unintentional weight loss   Good appetite      Denies flank pain, gross hematuria, dysuria, asymptomatic for infection. No f/c/n/v.       No family history of prostate, bladder or kidney cancer   Never smoker   Worked in Clinical research associate in [employer]            No flowsheet data found.         AUA Assessment Score:    ;     AUA Bother Rating:                 Past Medical History:        Diagnosis  Date         ?  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  BPH with obstruction/lower urinary tract symptoms       ?  CABG x 3  02/2004     ?  Cardiac cath  02/29/2004          oRCA 100%.  LM patent.  p/mLAD 85%.  D1 90%.  D2 90%.  CX 45%.  LVEDP 12 mmHg.  EF 45%.   CABG recommended.         ?  CKD (chronic kidney disease) stage 3, GFR 30-59 ml/min (HCC)            being followed. borderline         ?  Colon polyp  05/09/2019          Dr Hart Rochester         ?  Coronary artery disease            IMI w stent of RCA 9/03; s/p CABG X3 1/16         ?  Diastasis recti       ?  Dyslipidemia       ?  Elevated prostate specific antigen (PSA)       ?  H/O cardiovascular stress test  04/11/2013          Intermediate risk.  Mild-mod basal inferior infarction w/mild-mod peri-infarct ischemia in mid inferior, prox & mid inferolateral walls.  EF unreadable  due to gating error.  Neg EKG on max EST.  Ex time 6 min.           ?  H/O echocardiogram            LVE.  EF 45-50%.  Inferior, inferolateral WMA (10/07)         ?  Hypertension       ?  IFG (impaired fasting glucose)       ?  Microscopic hematuria       ?  Pleural effusion on right       ?  Right renal cyst       ?  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  SCC (squamous cell carcinoma)           ?  Ventricular tachycardia, nonsustained [facility](HCC)               Past Surgical History:         Procedure  Laterality  Date          ?  COLONOSCOPY  N/A  05/09/2019          cologuard+ 1/21; Dr Hart RochesterLawson hyperplastic polyp          ?  HX CORONARY ARTERY BYPASS GRAFT    03/03/04          LIMA to the LAD. Sequential SVG to the diagonal and obtuse marginal branch          ?  HX CORONARY STENT PLACEMENT    10/12/01          RCA stented using 3.5 x 13 mm Express 2 stent          ?  HX OTHER SURGICAL    2015          s/p resection neurofibroma RLE          ?  HX OTHER SURGICAL    2015          R kidney mass removal          ?  HX TONSILLECTOMY                  Family History         Problem  Relation  Age of Onset          ?  Heart Disease  Father                x3 heart bypass          ?  OSTEOARTHRITIS  Mother            ?  Hypertension  Sister               Current Outpatient Medications           Medication  Sig  Dispense  Refill           ?  amLODIPine (NORVASC) 5 mg tablet  TAKE 1 TABLET BY MOUTH DAILY  90 Tablet  3     ?  carvediloL (COREG) 12.5 mg tablet  TAKE 1 TABLET BY MOUTH TWICE DAILY WITH MEALS  180 Tablet  3     ?  icosapent ethyL (VASCEPA) 1 gram capsule  Take 2 Capsules by mouth two (2) times daily (with meals).  120 Capsule  11     ?  losartan (COZAAR) 50 mg tablet  TAKE 1 TABLET BY MOUTH TWICE DAILY  60 Tablet  11     ?  simvastatin (ZOCOR) 40 mg tablet  TAKE 1 TABLET BY MOUTH NIGHTLY  90 Tablet  3     ?  triamcinolone (NASACORT AQ) 55 mcg nasal inhaler  INSTILL 2 SPRAYS INTO EACH NOSTRIL DAILY  17 g  8     ?  aspirin 81 mg tablet  Take 81 mg by mouth daily.               ?  MULTIVITAMIN PO  Take 1 Tab by mouth daily.               Allergies:     Allergies        Allergen  Reactions         ?  Ace Inhibitors  Unknown (comments) and Cough             Intolerance b/c of dry coughing         ?  Bee Sting [Sting, Bee]  Hives             Social History          Socioeconomic History         ?  Marital status:  MARRIED              Spouse name:  Not on file         ?  Number of children:  Not on file     ?  Years of education:  Not on file         ?  Highest education level:  Not on file       Occupational History        ?  Not on file       Tobacco Use         ?  Smoking status:  Never     ?  Smokeless tobacco:  Never       Vaping Use         ?  Vaping Use:  Never used       Substance and Sexual Activity         ?  Alcohol use:  Yes             Comment: social         ?  Drug use:  No     ?  Sexual activity:  Not on file        Other Topics  Concern        ?  Not on file       Social History Narrative        ?  Not on file          Social Determinants of Health          Financial Resource Strain: Not on file     Food Insecurity: Not on file     Transportation Needs: Not on file     Physical Activity: Not on file     Stress: Not on file     Social Connections: Not on file     Intimate Partner  Violence: Not on file       Housing Stability: Not on file              Review of Systems   Constitutional: Fever: No   Skin: Rash: No   HEENT: Hearing difficulty: No   Eyes: Blurred vision: No   Cardiovascular: Chest pain: No   Respiratory: Shortness of breath: No   Gastrointestinal: Nausea/vomiting: No   Musculoskeletal: Back pain: No   Neurological: Weakness:  No   Psychological: Memory loss: No   Comments/additional findings:          PHYSCIAL EXAMINATION:       Visit Vitals      Ht  5\' 9"  (1.753 m)     Wt  192 lb (87.1 kg)        BMI  28.35 kg/m??           Constitutional: WDWN, Pleasant and appropriate affect, No acute distress.     CV:  No peripheral swelling noted   Respiratory: No respiratory distress or difficulties   Abdomen:  No abdominal distension noted   GU Male:  10/08/20   12/08/20 normal to visual inspection, no erythema or irritation, Sphincter with good tone, Rectum with no hemorrhoids,  fissures or masses.  Prostate is 50 grams. Anodular and smooth.    Skin: No jaundice.     Neuro/Psych:  Alert and oriented x 3. Affect appropriate.    LE: No edema   MSK: FROM      REVIEW OF LABS AND IMAGING:          Results for orders placed or performed in visit on 09/30/20     PROSTATE SPECIFIC ANTIGEN, TOTAL (PSA)         Result  Value  Ref Range            Prostate Specific Ag  4.42 (H)  0.00 - 4.00 ng/mL                Lab Results         Component  Value  Date/Time            Prostate Specific Ag  4.42 (H)  09/30/2020 01:04 PM       Prostate Specific Ag  3.89  07/09/2019 02:58 PM       Prostate Specific Ag  4.87 (H)  04/15/2019 04:51 PM       Prostate Specific Ag  5.3 (H)  02/10/2019 11:00 AM       Prostate Specific Ag  2.9  09/10/2017 09:35 AM            Prostate Specific Ag  3.0  09/11/2016 09:55 AM              A copy of today's office visit with all pertinent imaging results and labs were sent to the referring physician,Dumaran, 11/11/2016, MD         Murvin Donning, PA       London Sheer, PA-C    [facility]    [address] [place], [address]   [place], [place]   P: [phone]    F: [phone]

## 2020-10-13 ENCOUNTER — Ambulatory Visit: Admit: 2020-10-13 | Discharge: 2020-10-13 | Payer: MEDICARE | Primary: Internal Medicine

## 2020-10-13 ENCOUNTER — Inpatient Hospital Stay: Admit: 2020-10-13 | Payer: BLUE CROSS/BLUE SHIELD | Primary: Internal Medicine

## 2020-10-13 DIAGNOSIS — E785 Hyperlipidemia, unspecified: Secondary | ICD-10-CM

## 2020-10-13 LAB — CBC W/O DIFF
ABSOLUTE NRBC: 0 10*3/uL (ref 0.00–0.01)
HCT: 38.8 % (ref 36.0–48.0)
HGB: 12.9 g/dL — ABNORMAL LOW (ref 13.0–16.0)
MCH: 31 PG (ref 24.0–34.0)
MCHC: 33.2 g/dL (ref 31.0–37.0)
MCV: 93.3 FL (ref 78.0–100.0)
MPV: 10.2 FL (ref 9.2–11.8)
NRBC: 0 PER 100 WBC
PLATELET: 165 10*3/uL (ref 135–420)
RBC: 4.16 M/uL — ABNORMAL LOW (ref 4.35–5.65)
RDW: 13.6 % (ref 11.6–14.5)
WBC: 5.4 10*3/uL (ref 4.6–13.2)

## 2020-10-13 LAB — METABOLIC PANEL, COMPREHENSIVE
A-G Ratio: 1.3 (ref 0.8–1.7)
ALT (SGPT): 24 U/L (ref 16–61)
AST (SGOT): 17 U/L (ref 10–38)
Albumin: 3.7 g/dL (ref 3.4–5.0)
Alk. phosphatase: 66 U/L (ref 45–117)
Anion gap: 6 mmol/L (ref 3.0–18)
BUN/Creatinine ratio: 16 (ref 12–20)
BUN: 24 MG/DL — ABNORMAL HIGH (ref 7.0–18)
Bilirubin, total: 0.8 MG/DL (ref 0.2–1.0)
CO2: 29 mmol/L (ref 21–32)
Calcium: 9 MG/DL (ref 8.5–10.1)
Chloride: 108 mmol/L (ref 100–111)
Creatinine: 1.46 MG/DL — ABNORMAL HIGH (ref 0.6–1.3)
GFR est AA: 57 mL/min/{1.73_m2} — ABNORMAL LOW (ref >60)
GFR est non-AA: 47 mL/min/{1.73_m2} — ABNORMAL LOW (ref >60)
Globulin: 2.9 g/dL (ref 2.0–4.0)
Glucose: 94 mg/dL (ref 74–99)
Potassium: 3.8 mmol/L (ref 3.5–5.5)
Protein, total: 6.6 g/dL (ref 6.4–8.2)
Sodium: 143 mmol/L (ref 136–145)

## 2020-10-13 LAB — LIPID PANEL
CHOL/HDL Ratio: 2.3 (ref 0–5.0)
Chol/HDL Ratio: 2.3 (ref 0–5.0)
Cholesterol, Total: 133 MG/DL (ref <200)
Cholesterol, total: 133 MG/DL (ref <200)
HDL Cholesterol: 58 MG/DL (ref 40–60)
HDL: 58 MG/DL (ref 40–60)
LDL Calculated: 62.2 MG/DL (ref 0–100)
LDL, calculated: 62.2 MG/DL (ref 0–100)
Triglyceride: 64 MG/DL (ref <150)
Triglycerides: 64 MG/DL (ref <150)
VLDL Cholesterol Calculated: 12.8 MG/DL
VLDL, calculated: 12.8 MG/DL

## 2020-10-13 LAB — HEMOGLOBIN A1C WITH EAG
Est. average glucose: 105 mg/dL
Hemoglobin A1c: 5.3 % (ref 4.2–5.6)

## 2020-10-13 LAB — CBC
Hematocrit: 38.8 % (ref 36.0–48.0)
Hemoglobin: 12.9 g/dL — ABNORMAL LOW (ref 13.0–16.0)
MCH: 31 PG (ref 24.0–34.0)
MCHC: 33.2 g/dL (ref 31.0–37.0)
MCV: 93.3 FL (ref 78.0–100.0)
MPV: 10.2 FL (ref 9.2–11.8)
NRBC Absolute: 0 10*3/uL (ref 0.00–0.01)
Nucleated RBCs: 0 PER 100 WBC
Platelets: 165 10*3/uL (ref 135–420)
RBC: 4.16 M/uL — ABNORMAL LOW (ref 4.35–5.65)
RDW: 13.6 % (ref 11.6–14.5)
WBC: 5.4 10*3/uL (ref 4.6–13.2)

## 2020-10-13 LAB — COMPREHENSIVE METABOLIC PANEL
ALT: 24 U/L (ref 16–61)
AST: 17 U/L (ref 10–38)
Albumin/Globulin Ratio: 1.3 (ref 0.8–1.7)
Albumin: 3.7 g/dL (ref 3.4–5.0)
Alkaline Phosphatase: 66 U/L (ref 45–117)
Anion Gap: 6 mmol/L (ref 3.0–18)
BUN: 24 MG/DL — ABNORMAL HIGH (ref 7.0–18)
Bun/Cre Ratio: 16 (ref 12–20)
CO2: 29 mmol/L (ref 21–32)
Calcium: 9 MG/DL (ref 8.5–10.1)
Chloride: 108 mmol/L (ref 100–111)
Creatinine: 1.46 MG/DL — ABNORMAL HIGH (ref 0.6–1.3)
EGFR IF NonAfrican American: 47 mL/min/{1.73_m2} — ABNORMAL LOW (ref >60)
GFR African American: 57 mL/min/{1.73_m2} — ABNORMAL LOW (ref >60)
Globulin: 2.9 g/dL (ref 2.0–4.0)
Glucose: 94 mg/dL (ref 74–99)
Potassium: 3.8 mmol/L (ref 3.5–5.5)
Sodium: 143 mmol/L (ref 136–145)
Total Bilirubin: 0.8 MG/DL (ref 0.2–1.0)
Total Protein: 6.6 g/dL (ref 6.4–8.2)

## 2020-10-13 LAB — HEMOGLOBIN A1C W/EAG
Hemoglobin A1C: 5.3 % (ref 4.2–5.6)
eAG: 105 mg/dL

## 2020-10-20 ENCOUNTER — Ambulatory Visit: Admit: 2020-10-20 | Discharge: 2020-10-20 | Payer: MEDICARE | Attending: Internal Medicine | Primary: Internal Medicine

## 2020-10-20 ENCOUNTER — Ambulatory Visit: Attending: Internal Medicine | Primary: Internal Medicine

## 2020-10-20 DIAGNOSIS — E785 Hyperlipidemia, unspecified: Secondary | ICD-10-CM

## 2020-10-20 MED ORDER — SIMVASTATIN 40 MG TAB
40 mg | ORAL_TABLET | Freq: Every evening | ORAL | 3 refills | Status: AC
Start: 2020-10-20 — End: ?

## 2020-10-20 NOTE — Progress Notes (Signed)
75 y.o. WHITE/NON-HISPANIC male who presents for evaluation.    No cardiovascular complaints.  He saw Dr Boyd Kerbs and NST done in [facility] which showed no ischemia.  Does a lot of yardwork, bowls once weekly, no other set exercise.  Bp is running in the 120-130 over 70s when he checks at home.  He has noted some pedal edema on the amlodipine    No gi issues currently.  Sees NP Aloia for the elevated psa.  Still no bleeding to report    Denies polyuria, polydipsia, nocturia, vision change.  Not checking sugars at this time. He's keeping the weight stable    *LAST MEDICARE WELLNESS EXAM: 09/10/15, 04/07/20    Past Medical History:   Diagnosis Date    BPH with obstruction/lower urinary tract symptoms     CABG x 3 02/2004    CAD (coronary artery disease)     IMI w stent of RCA 9/03; s/p CABG X3 1/16    CKD (chronic kidney disease)     Colon polyp 05/09/2019    Dr Kellie Simmering    Diastasis recti     Dyslipidemia     Elevated prostate specific antigen (PSA)     H/O cardiac catheterization 02/29/2004    oRCA 100%.  LM patent.  p/mLAD 85%.  D1 90%.  D2 90%.  CX 45%.  LVEDP 12 mmHg.  EF 45%.  CABG recommended.    H/O cardiovascular stress test     Intermediate risk.  Mild-mod basal inf infarct w/mild-mod peri-infarct isch  mid inf, prox & mid inf/lat walls.  EF unreadable (3/15); ef 56%, tid 0.96, inf/lat fixed defect, neg isch (3/22)    H/O echocardiogram     LVE.  EF 45-50%.  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 Sequential SVG to the diagonal and  obtuse marginal branch    HX CORONARY STENT PLACEMENT  10/12/01    RCA stented using 3.5 x 13 mm Express 2 stent    HX OTHER SURGICAL  2015    s/p resection neurofibroma RLE    HX OTHER SURGICAL  2015    R kidney mass removal    HX TONSILLECTOMY       Social History     Socioeconomic History    Marital status: MARRIED     Spouse name: Not on file    Number of children: Not on file    Years of education: Not on file    Highest education level: Not on file   Occupational History    Not on file   Tobacco Use    Smoking status: Never    Smokeless tobacco: Never   Vaping Use    Vaping Use: Never used   Substance and Sexual Activity    Alcohol use: Yes     Comment: social  Drug use: No    Sexual activity: Not on file   Other Topics Concern    Not on file   Social History Narrative    Not on file     Social Determinants of Health     Financial Resource Strain: Not on file   Food Insecurity: Not on file   Transportation Needs: Not on file   Physical Activity: Not on file   Stress: Not on file   Social Connections: Not on file   Intimate Partner Violence: Not on file   Housing Stability: Not on file     Current Outpatient Medications   Medication Sig    simvastatin (ZOCOR) 40 mg tablet Take 1 Tablet by mouth nightly.    amLODIPine (NORVASC) 5 mg tablet TAKE 1 TABLET BY MOUTH DAILY    carvediloL (COREG) 12.5 mg tablet TAKE 1 TABLET BY MOUTH TWICE DAILY WITH MEALS    icosapent ethyL (VASCEPA) 1 gram capsule Take 2 Capsules by mouth two (2) times daily (with meals).    losartan (COZAAR) 50 mg tablet TAKE 1 TABLET BY MOUTH TWICE DAILY    triamcinolone (NASACORT AQ) 55 mcg nasal inhaler INSTILL 2 SPRAYS INTO EACH NOSTRIL DAILY    aspirin 81 mg tablet Take 81 mg by mouth daily.    MULTIVITAMIN PO Take 1 Tab by mouth daily.     No current facility-administered medications for this visit.     Allergies   Allergen Reactions    Ace Inhibitors Unknown (comments) and Cough     Intolerance b/c of dry coughing    Bee Sting [Sting, Bee]  Hives     REVIEW OF SYSTEMS: colo 4/21 Dr Kellie Simmering  Ophtho - no vision change or eye pain  Oral - no mouth pain, tongue or tooth problems  Ears - no hearing loss, ear pain, fullness, no swallowing problems  Cardiac - no CP, PND, orthopnea, edema, palpitations or syncope  Chest - no breast masses  Resp - no wheezing, chronic coughing, dyspnea  GI - no heartburn, nausea, vomiting, change in bowel habits, bleeding, hemorrhoids  Urinary - no dysuria, hematuria, flank pain, urgency, frequency    Visit Vitals  BP (!) 140/70   Pulse 60   Temp 97.3 ??F (36.3 ??C) (Temporal)   Resp 16   Ht 5' 9"  (1.753 m)   Wt 189 lb (85.7 kg)   SpO2 98%   BMI 27.91 kg/m??       A&O x3  Affect is appropriate.  Mood stable  No apparent distress  Anicteric, no JVD, adenopathy or thyromegaly.   No carotid bruits or radiated murmur  Lungs clear to auscultation, no wheezes or rales  Heart showed regular rate and rhythm. No murmur, rubs, gallops  Abdomen soft nontender, no hepatosplenomegaly or masses.  Diastasis noted  Extremities with 1+ ankle edema.  Pulses 1-2+ symmetrically    LABS  From 2/16 showed gluc 101, cr 1.28, gfr 56,  alt 12,    chol 143, tg 65, hdl 61, ldl-c 69, wbc 5.0, hb 13.3, plt 175, tsh 2.94, ua neg  From 2/17 showed gluc 96,   cr 1.19, gfr>60, alt 29, hba1c 5.4, umar 5.0,  chol 170, tg 65, hdl 66, ldl-c 91, wbc 5.3, hb 13.6, plt 184, tsh 2.92, ua neg  From 2/18 showed gluc 104, cr 1.33, gfr 53,  alt 13, hba1c 5.6, umar 6.0,  chol 121, tg 51, hdl 58, ldl-c 53, wbc 5.2, hb 13.6, plt 177, tsh 3.09  From 8/18 showed gluc 104, cr 1.26, gfr 56,  alt 33,    chol 135, tg 58, hdl 61, ldl-c 62, wbc 6.0, hb 13.6, plt 180  From 8/19 showed gluc 97,   cr 1.36, gfr 51,     chol 134, tg 82, hdl 51, ldl-c 67,                 psa 2.90  From 2/20 showed gluc 96,   cr 1.28, gfr 55,  alt 24,    chol 137, tg 66, hdl 57, ldl-c 67  From 7/20 showed gluc 93,   cr 1.67, gfr 40,  alt 27,     chol 151, tg 95, hdl 58, ldl-c 74  From 1/21 showed gluc 95,   cr  1.74, gfr 39,  alt 25, hba1c 5.5,   chol 134, tg 84, hdl 50, ldl-c 67, wbc 6.0, hb 12.9, plt 194,              psa 5.30  From 4/21 showed gluc 98,   cr 1.37, gfr 51  From 8/21 showed gluc 106, cr 1.28, gfr 55,  alt 21, hba1c 5.3   chol 125, tg 45, hdl 63, ldl-c 53  From 2/22 showed gluc 97,   cr 1.23, gfr 57,  hba1c 5.4,           wbc 5.6, hb 12.0, plt 175  From 3/22 showed                            fe 70, %sat 24, ferritin 70, b12 434, fol>20, spep neg  From 8/22 showed                  psa 4.42    Results for orders placed or performed during the hospital encounter of 10/13/20   CBC W/O DIFF   Result Value Ref Range    WBC 5.4 4.6 - 13.2 K/uL    RBC 4.16 (L) 4.35 - 5.65 M/uL    HGB 12.9 (L) 13.0 - 16.0 g/dL    HCT 38.8 36.0 - 48.0 %    MCV 93.3 78.0 - 100.0 FL    MCH 31.0 24.0 - 34.0 PG    MCHC 33.2 31.0 - 37.0 g/dL    RDW 13.6 11.6 - 14.5 %    PLATELET 165 135 - 420 K/uL    MPV 10.2 9.2 - 11.8 FL    NRBC 0.0 0 PER 100 WBC    ABSOLUTE NRBC 0.00 0.00 - 2.59 K/uL   METABOLIC PANEL, COMPREHENSIVE   Result Value Ref Range    Sodium 143 136 - 145 mmol/L    Potassium 3.8 3.5 - 5.5 mmol/L    Chloride 108 100 - 111 mmol/L    CO2 29 21 - 32 mmol/L    Anion gap 6 3.0 - 18 mmol/L    Glucose 94 74 - 99 mg/dL    BUN 24 (H) 7.0 - 18 MG/DL    Creatinine 1.46 (H) 0.6 - 1.3 MG/DL    BUN/Creatinine ratio 16 12 - 20      GFR est AA 57 (L) >60 ml/min/1.33m    GFR est non-AA 47 (L) >60 ml/min/1.739m   Calcium 9.0 8.5 - 10.1 MG/DL    Bilirubin, total 0.8 0.2 - 1.0 MG/DL    ALT (SGPT) 24 16 - 61 U/L    AST (SGOT) 17 10 - 38 U/L  Alk. phosphatase 66 45 - 117 U/L    Protein, total 6.6 6.4 - 8.2 g/dL    Albumin 3.7 3.4 - 5.0 g/dL    Globulin 2.9 2.0 - 4.0 g/dL    A-G Ratio 1.3 0.8 - 1.7     LIPID PANEL   Result Value Ref Range    LIPID PROFILE          Cholesterol, total 133 <200 MG/DL    Triglyceride 64 <150 MG/DL    HDL Cholesterol 58 40 - 60 MG/DL    LDL, calculated 62.2 0 - 100 MG/DL    VLDL, calculated 12.8 MG/DL    CHOL/HDL  Ratio 2.3 0 - 5.0     HEMOGLOBIN A1C WITH EAG   Result Value Ref Range    Hemoglobin A1c 5.3 4.2 - 5.6 %    Est. average glucose 105 mg/dL     We reviewed the patient's labs from the last several visits to point out trends in the numbers        Patient Active Problem List   Diagnosis Code    CAD, history of inferior wall myocardial infarction/status post stenting of the RCA in September 2003/status post CABG X3 in January 2006/EF 45%. I25.10    PVC's     BPH with obstruction/lower urinary tract symptoms N40.1, N13.8    Elevated prostate specific antigen (PSA) R97.20    Hyperlipidemia E78.5    Primary hypertension I10    Impaired fasting glucose R73.01    CKD (chronic kidney disease) stage 3, GFR 30-59 ml/min (HCC) N18.30    Colon polyp K63.5     Assessment and plan:  1.  CHD.  F/U Dr Boyd Kerbs  2.  BPH/LUTS, elev psa.  F/u PA Aloia  3.  HLP.  At target on current   4.  HTN.  Continue coreg, cozaar and amlo and controlled  5.  IFG.  Lifestyle and dietary measures. Further weight loss would be ideal  6.  CKD.  Improved, continue to follow  7.  Colon polyps.  Follow-up colonoscopy 2028 per GI?  8.  Anemia.  Improved and continue to follow  9.  Edema.  Discussed coming off amlo vs adding diuretic vs staying the course.  He opted on the latter but will call if things progress or he changes his mind      RTC 3/23    Instructions above were handwritten on the lab results sheet that I gave the patient today    Above conditions discussed at length and patient vocalized understanding.  All questions answered to patient satisfaction        ICD-10-CM ICD-9-CM    1. Hyperlipidemia, unspecified hyperlipidemia type  E78.5 272.4 simvastatin (ZOCOR) 40 mg tablet      2. Atherosclerosis of native coronary artery of native heart without angina pectoris  I25.10 414.01       3. Stage 3a chronic kidney disease (HCC)  N18.31 585.3       4. Impaired fasting glucose  X93.71 696.78 METABOLIC PANEL, BASIC      5. Primary hypertension  L38 101.7  METABOLIC PANEL, BASIC      6. Anemia, unspecified type  D64.9 285.9 CBC W/O DIFF

## 2020-10-20 NOTE — Progress Notes (Signed)
 Alm Sharps presents with   Chief Complaint   Patient presents with    Follow-up     6 month    Hypertension    Cholesterol Problem    Labs     10-13-20                1. Have you been to the ER, urgent care clinic since your last visit?  Hospitalized since your last visit? No    2. Have you seen or consulted any other health care providers outside of the [facility] since your last visit?  [facility]      3. For patients aged 76-75: Has the patient had a colonoscopy / FIT/ Cologuard? NA - based on age

## 2020-10-27 ENCOUNTER — Encounter: Attending: Interventional Cardiology | Primary: Internal Medicine

## 2020-10-27 ENCOUNTER — Ambulatory Visit: Attending: Interventional Cardiology | Primary: Internal Medicine

## 2020-10-27 DIAGNOSIS — I2510 Atherosclerotic heart disease of native coronary artery without angina pectoris: Secondary | ICD-10-CM

## 2020-10-27 NOTE — Progress Notes (Signed)
HISTORY OF PRESENT ILLNESS  Raymond [name] is a 75 y.o. male.    ASSESSMENT and PLAN    [name], previous Dr. Junius Finner patient, has history of CAD.  Back in January 2006, he presented with sinking feeling and nonsustained VT.  Ultimately, he underwent coronary artery bypass graft surgery with LIMA to LAD as well as sequential SVG to diagonal and OM branch.  Since that time, he has had nuclear scan in 2012 which showed EF of 55% with fixed inferior defect.  His nuclear scan in February 2018 showed inferior fixed defect without ischemia.  His EF was noted to be 52%.  Because of bradycardia, he did have Holter monitor in February 2018 which showed baseline sinus bradycardia without symptoms.  His minimum heart rate was 41 bpm.  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  He does have history hypertension, and hyperlipidemia.  He denies active tobacco use.  He has chronic renal disease with his last creatinine in July 2020 to be about 1.65 from baseline of 1.36.  Diuretic regimen was decreased at that time.  In March 2022, he had nuclear scan performed which revealed EF 52% with medium size inferior/inferolateral fixed defect.  This was unchanged from 2012 study.    CAD:   Clinically stable.  BP:   Well controlled at 136/72.  Rhythm:   Asymptomatic sinus bradycardia at 52 bpm.  CHF:    There is no evidence of decompensated CHF noted.  Weight:    His weight today is 194 pounds.  I advised him to try to lose about 10 pounds over the next 12 months.  Cholesterol:   Target LDL <70.  He remains on Zocor 40 mg daily.  Anti-platelet:   Remains on ASA.    He has bilateral lower extremity swelling which gets worse in the evening and resolves by morning, is likely dependent.  Agree with not initiating diuretic regimen for this.  I did discuss with him at length about possible use of compression stockings.  He states that he will wait  and see.  I will defer this to his PCP.    I will see him back in 6 months.  Thank you.    Encounter Diagnoses   Name Primary?   ??? Atherosclerosis of native coronary artery of native heart without angina pectoris Yes   ??? SOB (shortness of breath)    ??? Nonsustained ventricular tachycardia (HCC)      current treatment plan is effective, no change in therapy  lab results and schedule of future lab studies reviewed with patient  reviewed diet, exercise and weight control  cardiovascular risk and specific lipid/LDL goals reviewed  use of aspirin to prevent MI and TIA's discussed    Follow-up  Pertinent negatives include no chest pain and no shortness of breath.   Leg Swelling  Pertinent negatives include no chest pain and no shortness of breath.   Today, [name] has no complaints of chest pains, increased shortness of breath or decreased exercise capacity.  He does have complaints of bilateral lower extremity swelling mostly noted in the evenings but dissipated by the morning.  I explained to him that this is likely dependent edema and will likely get worse as he ages.  I agree with his PCP for not putting him on diuretic regimen or other medical therapy as there is no evidence of heart  failure.  I did talk to him at length about the pressure stockings.    Review of Systems   Respiratory:  Negative for shortness of breath.    Cardiovascular:  Positive for leg swelling. Negative for chest pain, palpitations, orthopnea, claudication and PND.   All other systems reviewed and are negative.    Physical Exam  Vitals and nursing note reviewed.   Constitutional:       Appearance: Normal appearance.   HENT:      Head: Normocephalic.   Eyes:      Conjunctiva/sclera: Conjunctivae normal.   Neck:      Vascular: No carotid bruit.   Cardiovascular:      Rate and Rhythm: Regular rhythm. Bradycardia present.   Pulmonary:      Breath sounds: Normal breath sounds.   Abdominal:      Palpations: Abdomen is soft.   Musculoskeletal:          General: Swelling present.      Cervical back: No rigidity.   Skin:     General: Skin is warm and dry.   Neurological:      General: No focal deficit present.      Mental Status: He is alert and oriented to person, place, and time.   Psychiatric:         Mood and Affect: Mood normal.         Behavior: Behavior normal.       PCP: Bennie Hind, MD    Past Medical History:   Diagnosis Date   ??? BPH with obstruction/lower urinary tract symptoms    ??? CABG x 3 02/2004   ??? CAD (coronary artery disease)     IMI w stent of RCA 9/03; s/p CABG X3 1/16   ??? CKD (chronic kidney disease)    ??? Colon polyp 05/09/2019    Dr Hart Rochester   ??? Diastasis recti    ??? Dyslipidemia    ??? Elevated prostate specific antigen (PSA)    ??? H/O cardiac catheterization 02/29/2004      oRCA 100%.  LM patent.  p/mLAD 85%.  D1 90%.  D2 90%.  CX 45%.  LVEDP 12 mmHg.  EF 45%.  CABG recommended.   ??? H/O cardiovascular stress test     Intermediate risk.  Mild-mod basal inf infarct w/mild-mod peri-infarct isch  mid inf, prox & mid inf/lat walls.  EF unreadable (3/15); ef 56%, tid 0.96, inf/lat fixed defect, neg isch (3/22)   ??? H/O echocardiogram     LVE.  EF 45-50%.  Inferior, inferolateral WMA (10/07)   ??? Hypertension    ??? IFG (impaired fasting glucose)    ??? Microscopic hematuria    ??? Pleural effusion on right    ??? Right renal cyst    ??? 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 SCC (squamous cell carcinoma)    ??? Ventricular tachycardia, nonsustained [facility])        Past Surgical History:   Procedure Laterality Date   ??? COLONOSCOPY N/A 05/09/2019    cologuard+ 1/21; Dr Hart Rochester hyperplastic polyp   ??? HX CORONARY ARTERY BYPASS GRAFT  03/03/04    LIMA to the LAD. Sequential SVG to the diagonal and obtuse marginal branch   ??? HX CORONARY STENT PLACEMENT  10/12/01    RCA stented using 3.5 x 13 mm Express 2 stent   ??? HX OTHER  SURGICAL  2015    s/p resection neurofibroma RLE   ??? HX OTHER SURGICAL  2015    R kidney mass removal    ??? HX TONSILLECTOMY         Current Outpatient Medications   Medication Sig Dispense Refill   ??? simvastatin (ZOCOR) 40 mg tablet Take 1 Tablet by mouth nightly. 90 Tablet 3   ??? amLODIPine (NORVASC) 5 mg tablet TAKE 1 TABLET BY MOUTH DAILY 90 Tablet 3   ??? carvediloL (COREG) 12.5 mg tablet TAKE 1 TABLET BY MOUTH TWICE DAILY WITH MEALS 180 Tablet 3   ??? icosapent ethyL (VASCEPA) 1 gram capsule Take 2 Capsules by mouth two (2) times daily (with meals). 120 Capsule 11   ??? losartan (COZAAR) 50 mg tablet TAKE 1 TABLET BY MOUTH TWICE DAILY 60 Tablet 11     ??? triamcinolone (NASACORT AQ) 55 mcg nasal inhaler INSTILL 2 SPRAYS INTO EACH NOSTRIL DAILY 17 g 8   ??? aspirin 81 mg tablet Take 81 mg by mouth daily.     ??? MULTIVITAMIN PO Take 1 Tab by mouth daily.         The patient has a family history of    Social History     Tobacco Use   ??? Smoking status: Never   ??? Smokeless tobacco: Never   Vaping Use   ??? Vaping Use: Never used   Substance Use Topics   ??? Alcohol use: Yes     Comment: social   ??? Drug use: No       Lab Results   Component Value Date/Time    Cholesterol, total 133 10/13/2020 08:12 AM    HDL Cholesterol 58 10/13/2020 08:12 AM    LDL, calculated 62.2 10/13/2020 08:12 AM    Triglyceride 64 10/13/2020 08:12 AM    CHOL/HDL Ratio 2.3 10/13/2020 08:12 AM        BP Readings from Last 3 Encounters:   10/20/20 (!) 140/70   05/03/20 (!) 162/80   04/23/20 (!) 142/82        Pulse Readings from Last 3 Encounters:   10/20/20 60   04/23/20 (!) 51   04/07/20 60       Wt Readings from Last 3 Encounters:   10/20/20 85.7 kg (189 lb)   10/08/20 87.1 kg (192 lb)   05/03/20 87.1 kg (192 lb)         EKG: unchanged from previous tracings, sinus bradycardia, RBBB, 1st degree AV block.

## 2021-03-12 ENCOUNTER — Encounter

## 2021-03-13 ENCOUNTER — Encounter

## 2021-03-23 MED ORDER — LOSARTAN POTASSIUM 50 MG PO TABS
50 MG | ORAL_TABLET | Freq: Every day | ORAL | 3 refills | Status: DC
Start: 2021-03-23 — End: 2021-03-24

## 2021-03-23 NOTE — Telephone Encounter (Signed)
Pt calling for Losartan Potassium 50 mg tabsl    His pharmacy sent request to [facility] but per pt cardiology wanted pcp to prescribe.     Pharmacy is [facility]

## 2021-03-23 NOTE — Telephone Encounter (Signed)
PCP: Bennie Hind, MD    Last appt: @[email or web address] @  Future Appointments   Date Time Provider Department Center   04/13/2021  8:45 AM [facility] LAB VISIT [facility] BS AMB   04/20/2021  9:00 AM Raymund 04/22/2021, MD [facility] BS AMB   04/27/2021  9:00 AM 04/29/2021, MD [facility] BS AMB   09/30/2021  8:30 AM [facility] NURSE 10/02/2021 Sched   10/07/2021  9:20 AM 12/07/2021, PA [facility] Sched       Requested Prescriptions     Pending Prescriptions Disp Refills    losartan (COZAAR) 50 MG tablet 90 tablet 3     Sig: Take 1 tablet by mouth daily

## 2021-03-23 NOTE — Telephone Encounter (Signed)
Sent!

## 2021-03-24 MED ORDER — LOSARTAN POTASSIUM 50 MG PO TABS
50 MG | ORAL_TABLET | Freq: Two times a day (BID) | ORAL | 3 refills | Status: DC
Start: 2021-03-24 — End: 2021-04-20

## 2021-03-24 NOTE — Telephone Encounter (Signed)
Resent corrected script

## 2021-03-24 NOTE — Telephone Encounter (Signed)
Pt calling re: Losartan 50 mg      He said it was supposed to be take 1 tab by mouth twice daily  so he would need 180 tabs    But it was sent to the pharmacy as 50 mg 90 tabs take 1 time per day     Pharmcy is [facility]

## 2021-04-13 ENCOUNTER — Encounter: Primary: Internal Medicine

## 2021-04-13 ENCOUNTER — Encounter: Payer: BLUE CROSS/BLUE SHIELD | Primary: Internal Medicine

## 2021-04-13 ENCOUNTER — Inpatient Hospital Stay: Admit: 2021-04-13 | Payer: BLUE CROSS/BLUE SHIELD | Primary: Internal Medicine

## 2021-04-13 DIAGNOSIS — R7301 Impaired fasting glucose: Secondary | ICD-10-CM

## 2021-04-13 LAB — CBC
Hematocrit: 40 % (ref 36.0–48.0)
Hemoglobin: 13.1 g/dL (ref 13.0–16.0)
MCH: 30.3 PG (ref 24.0–34.0)
MCHC: 32.8 g/dL (ref 31.0–37.0)
MCV: 92.6 FL (ref 78.0–100.0)
MPV: 10.4 FL (ref 9.2–11.8)
Nucleated RBCs: 0 PER 100 WBC
Platelets: 177 10*3/uL (ref 135–420)
RBC: 4.32 M/uL — ABNORMAL LOW (ref 4.35–5.65)
RDW: 13.6 % (ref 11.6–14.5)
WBC: 5.7 10*3/uL (ref 4.6–13.2)
nRBC: 0 10*3/uL (ref 0.00–0.01)

## 2021-04-13 LAB — BASIC METABOLIC PANEL
Anion Gap: 5 mmol/L (ref 3.0–18)
BUN: 17 MG/DL (ref 7.0–18)
Bun/Cre Ratio: 13 (ref 12–20)
CO2: 28 mmol/L (ref 21–32)
Calcium: 8.9 MG/DL (ref 8.5–10.1)
Chloride: 109 mmol/L (ref 100–111)
Creatinine: 1.29 MG/DL (ref 0.6–1.3)
Est, Glom Filt Rate: 57 mL/min/{1.73_m2} — ABNORMAL LOW (ref >60)
Glucose: 103 mg/dL — ABNORMAL HIGH (ref 74–99)
Potassium: 3.7 mmol/L (ref 3.5–5.5)
Sodium: 142 mmol/L (ref 136–145)

## 2021-04-20 ENCOUNTER — Encounter: Attending: Internal Medicine | Primary: Internal Medicine

## 2021-04-20 ENCOUNTER — Ambulatory Visit
Admit: 2021-04-20 | Discharge: 2021-04-20 | Payer: BLUE CROSS/BLUE SHIELD | Attending: Internal Medicine | Primary: Internal Medicine

## 2021-04-20 ENCOUNTER — Encounter
Admit: 2021-04-20 | Discharge: 2021-04-20 | Payer: BLUE CROSS/BLUE SHIELD | Attending: Internal Medicine | Primary: Internal Medicine

## 2021-04-20 DIAGNOSIS — Z0000 Encounter for general adult medical examination without abnormal findings: Secondary | ICD-10-CM

## 2021-04-20 DIAGNOSIS — 1 ERRONEOUS ENCOUNTER ICD10: Secondary | ICD-10-CM

## 2021-04-20 NOTE — Progress Notes (Signed)
76 y.o. male who presents for evaluation.    No cardiovascular complaint, active with yardwork, bowls once weekly, no other set exercise.  Bp is running in the 120-130 over 70s when he checks at home.  The pedal edema is the same on the amlodipine.  Has appt with Dr Dory Peru upcoming soon    No gi issues currently.  Sees NP Aloia for the elevated psa.     Denies polyuria, polydipsia, nocturia, vision change.  Not checking sugars at this time. He's keeping the weight stable    Vitals 04/20/2021 04/20/2021 10/27/2020 10/20/2020 10/08/2020 05/03/2020   Weight 190 lb 190 lb  189 lb 192 lb 192 lb     *LAST MEDICARE WELLNESS EXAM: 09/10/15, 04/07/20, not medicare b    Past Medical History:   Diagnosis Date    BPH with obstruction/lower urinary tract symptoms     CAD (coronary artery disease)     IMI w stent of RCA 9/03; s/p CABG X3 1/16    CKD (chronic kidney disease)     Colon polyp 05/09/2019    Dr Hart Rochester    COVID-19 vaccine dose declined 04/2021    boosters    Diastasis recti     Dyslipidemia     Elevated prostate specific antigen (PSA)     H/O cardiac catheterization 02/29/2004    oRCA 100%.  LM patent.  p/mLAD 85%.  D1 90%.  D2 90%.  CX 45%.  LVEDP 12 mmHg.  EF 45%.  CABG recommended.    H/O cardiovascular stress test     Intermediate risk.  Mild-mod basal inf infarct w/mild-mod peri-infarct isch  mid inf, prox & mid inf/lat walls.  EF unreadable (3/15); ef 56%, tid 0.96, inf/lat fixed defect, neg isch (3/22)    H/O echocardiogram     LVE.  EF 45-50%.  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 Sequential SVG to the diagonal and obtuse marginal branch    OTHER SURGICAL HISTORY  2015    s/p resection neurofibroma RLE    OTHER SURGICAL HISTORY  2015    R kidney mass removal    TONSILLECTOMY       Social History     Socioeconomic History    Marital status: Married     Spouse name: Not on file    Number of children: Not on file    Years of education: Not on file    Highest education level: Not on file   Occupational History    Not on file   Tobacco Use    Smoking status: Never    Smokeless tobacco: Never   Substance and Sexual Activity    Alcohol use: Yes  Drug use: No    Sexual activity: Not on file   Other Topics Concern    Not on file   Social History Narrative    Not on file     Social Determinants of Health     Financial Resource Strain: Not on file   Food Insecurity: Not on file   Transportation Needs: Not on file   Physical Activity: Insufficiently Active    Days of Exercise per Week: 3 days    Minutes of Exercise per Session: 30 min   Stress: Not on file   Social Connections: Not on file   Intimate Partner Violence: Not on file   Housing Stability: Not on file     Current Outpatient Medications   Medication Sig    Multiple Vitamin (MULTIVITAMIN PO) Take 1 tablet by mouth daily    amLODIPine (NORVASC) 5 MG tablet Take 5 mg by mouth daily    carvedilol (COREG) 12.5 MG tablet TAKE 1 TABLET BY MOUTH TWICE DAILY WITH MEALS    Icosapent Ethyl (VASCEPA) 1 g CAPS capsule Take 2 capsules by mouth 2 times daily (with meals)    losartan (COZAAR) 50 MG tablet TAKE 1 TABLET BY MOUTH TWICE DAILY    simvastatin (ZOCOR) 40 MG tablet Take 40 mg by mouth    aspirin 81 MG chewable tablet Take 81 mg by mouth daily    triamcinolone (NASACORT) 55 MCG/ACT nasal inhaler INSTILL 2 SPRAYS INTO EACH NOSTRIL DAILY     No current facility-administered medications for this visit.      Allergies   Allergen Reactions    Ace Inhibitors Cough     Other reaction(s): Unknown (comments)  Intolerance b/c of dry coughing    Bee Venom Hives     REVIEW OF SYSTEMS: colo 4/21 Dr Hart Rochester  Ophtho - no vision change or eye pain  Oral - no mouth pain, tongue or tooth problems  Ears - no hearing loss, ear pain, fullness, no swallowing problems  Cardiac - no CP, PND, orthopnea, edema, palpitations or syncope  Chest - no breast masses  Resp - no wheezing, chronic coughing, dyspnea  GI - no heartburn, nausea, vomiting, change in bowel habits, bleeding, hemorrhoids  Urinary - no dysuria, hematuria, flank pain, urgency, frequency    Vitals:    04/20/21 0912   BP: (!) 140/70   Pulse: 56   Resp: 19   Temp:    SpO2: 99%   A&O x3  Affect is appropriate.  Mood stable  No apparent distress  Anicteric, no JVD, adenopathy or thyromegaly.   No carotid bruits or radiated murmur  Lungs clear to auscultation, no wheezes or rales  Heart showed regular rate and rhythm. No murmur, rubs, gallops  Abdomen soft nontender, no hepatosplenomegaly or masses.  Diastasis noted  Extremities with 1+ ankle edema.  Pulses 1-2+ symmetrically    LABS  From 2/16 showed gluc 101, cr 1.28, gfr 56,  alt 12,    chol 143, tg 65, hdl 61, ldl-c 69, wbc 5.0, hb 13.3, plt 175, tsh 2.94, ua neg  From 2/17 showed gluc 96,   cr 1.19, gfr>60, alt 29, hba1c 5.4, umar 5.0,  chol 170, tg 65, hdl 66, ldl-c 91, wbc 5.3, hb 13.6, plt 184, tsh 2.92, ua neg  From 2/18 showed gluc 104, cr 1.33, gfr 53,  alt 13, hba1c 5.6, umar 6.0,  chol 121, tg 51, hdl 58, ldl-c 53, wbc 5.2, hb 13.6, plt 177, tsh 3.09  From  8/18 showed gluc 104, cr 1.26, gfr 56,  alt 33,    chol 135, tg 58, hdl 61, ldl-c 62, wbc 6.0, hb 13.6, plt 180  From 8/19 showed gluc 97,   cr 1.36, gfr 51,     chol 134, tg 82, hdl 51, ldl-c 67,                 psa 2.90  From 2/20 showed gluc 96,   cr 1.28, gfr 55,  alt 24,    chol 137, tg 66, hdl 57, ldl-c 67  From 7/20 showed gluc 93,   cr 1.67, gfr 40,  alt  27,     chol 151, tg 95, hdl 58, ldl-c 74  From 1/21 showed gluc 95,   cr 1.74, gfr 39,  alt 25, hba1c 5.5,   chol 134, tg 84, hdl 50, ldl-c 67, wbc 6.0, hb 12.9, plt 194,              psa 5.30  From 4/21 showed gluc 98,   cr 1.37, gfr 51  From 8/21 showed gluc 106, cr 1.28, gfr 55,  alt 21, hba1c 5.3   chol 125, tg 45, hdl 63, ldl-c 53  From 2/22 showed gluc 97,   cr 1.23, gfr 57,  hba1c 5.4,           wbc 5.6, hb 12.0, plt 175  From 3/22 showed                            fe 70, %sat 24, ferritin 70, b12 434, fol>20, spep neg  From 8/22 showed                  psa 4.42  From 9/22 showed gluc 94,   cr 1.46, gfr 47, alt 17, hba1c 5.3,   chol 133, tg 64, hdl 58, ldl-c 62, wbc 6.4, hb 12.9, plt 165    Results for orders placed or performed during the hospital encounter of 04/13/21 (from the past 2160 hour(s))   Basic Metabolic Panel   Result Value Ref Range    Sodium 142 136 - 145 mmol/L    Potassium 3.7 3.5 - 5.5 mmol/L    Chloride 109 100 - 111 mmol/L    CO2 28 21 - 32 mmol/L    Anion Gap 5 3.0 - 18 mmol/L    Glucose 103 (H) 74 - 99 mg/dL    BUN 17 7.0 - 18 MG/DL    Creatinine 1.611.29 0.6 - 1.3 MG/DL    Bun/Cre Ratio 13 12 - 20      Est, Glom Filt Rate 57 (L) >60 ml/min/1.473m2    Calcium 8.9 8.5 - 10.1 MG/DL   CBC   Result Value Ref Range    WBC 5.7 4.6 - 13.2 K/uL    RBC 4.32 (L) 4.35 - 5.65 M/uL    Hemoglobin 13.1 13.0 - 16.0 g/dL    Hematocrit 09.640.0 04.536.0 - 48.0 %    MCV 92.6 78.0 - 100.0 FL    MCH 30.3 24.0 - 34.0 PG    MCHC 32.8 31.0 - 37.0 g/dL    RDW 40.913.6 81.111.6 - 91.414.5 %    Platelets 177 135 - 420 K/uL    MPV 10.4 9.2 - 11.8 FL    Nucleated RBCs 0.0 0 PER 100 WBC    nRBC 0.00 0.00 - 0.01 K/uL  We reviewed the patient's labs from the last several visits to point out trends in the numbers    Patient Active Problem List   Diagnosis    CKD (chronic kidney disease) stage 3, GFR 30-59 ml/min (HCC)    Coronary atherosclerosis of native coronary artery    Elevated prostate specific antigen (PSA)    BPH with  obstruction/lower urinary tract symptoms    Hyperlipidemia    Colon polyp    Primary hypertension    Impaired fasting glucose         Assessment and plan:  1.  CHD.  F/U Dr Dory Peru  2.  BPH/LUTS, elev psa.  F/u PA Aloia  3.  HLP.  At target on current.  We did discuss the lower ldl target of 55 and he will talk to cards about utility of adding zetia  4.  HTN.  Continue coreg, cozaar and amlo and controlled  5.  IFG.  Lifestyle and dietary measures. Further weight loss would be ideal  6.  CKD.  Improved, continue to follow  7.  Colon polyps.  Follow-up colonoscopy 2026      RTC 9/23    Instructions above were handwritten on the lab results sheet that I gave the patient today    Above conditions discussed at length and patient vocalized understanding.  All questions answered to patient satisfaction     Diagnosis Orders   1. Physical exam        2. Atherosclerosis of native coronary artery of native heart without angina pectoris        3. Primary hypertension        4. Polyp of colon, unspecified part of colon, unspecified type        5. Impaired fasting glucose  Comprehensive Metabolic Panel    HEMOGLOBIN A1C W/O EAG      6. Stage 3a chronic kidney disease (HCC)        7. BPH with obstruction/lower urinary tract symptoms        8. Hyperlipidemia, unspecified hyperlipidemia type  Lipid Panel

## 2021-04-20 NOTE — Progress Notes (Signed)
Raymond [name] presents today for   Chief Complaint   Patient presents with    Annual Exam    Discuss Labs     04-13-21    Hyperlipidemia    Hypertension    Chronic Kidney Disease     Stage 3a     Anemia     1. "Have you been to the ER, urgent care clinic since your last visit?  Hospitalized since your last visit?" no    2. "Have you seen or consulted any other health care providers outside of the [facility] since your last visit?" no     3. For patients aged 61-75: Has the patient had a colonoscopy / FIT/ Cologuard? NA - based on age      If the patient is male:    4. For patients aged 83-74: Has the patient had a mammogram within the past 2 years? NA - based on age or sex      14. For patients aged 21-65: Has the patient had a pap smear? NA - based on age or sex

## 2021-04-20 NOTE — Progress Notes (Signed)
Error

## 2021-04-20 NOTE — Progress Notes (Signed)
Error. Patient not Medicare Wellness.     Moved to another appointment slot, as physical exam.

## 2021-04-27 ENCOUNTER — Encounter: Attending: Interventional Cardiology | Primary: Internal Medicine

## 2021-04-27 ENCOUNTER — Ambulatory Visit
Admit: 2021-04-27 | Discharge: 2021-04-27 | Payer: BLUE CROSS/BLUE SHIELD | Attending: Interventional Cardiology | Primary: Internal Medicine

## 2021-04-27 DIAGNOSIS — I2510 Atherosclerotic heart disease of native coronary artery without angina pectoris: Secondary | ICD-10-CM

## 2021-04-27 MED ORDER — LOSARTAN POTASSIUM 50 MG PO TABS
50 MG | ORAL_TABLET | ORAL | 3 refills | Status: AC
Start: 2021-04-27 — End: 2022-04-24

## 2021-04-27 NOTE — Progress Notes (Signed)
HISTORY OF PRESENT ILLNESS  Raymond MornDavid [name]  76 y.o. male     Chief Complaint   Patient presents with    Follow-up     6 month follow up- no complaints            ASSESSMENT and PLAN    The primary encounter diagnosis was Atherosclerosis of native coronary artery without angina pectoris, unspecified whether native or transplanted heart. Diagnoses of Ventricular tachycardia and Shortness of breath were also pertinent to this visit.    Raymond [name], previous Dr. Junius Finnerhough patient, has history of CAD.  Back in January 2006, he presented with sinking feeling and nonsustained VT.  Ultimately, he underwent coronary artery bypass graft surgery with LIMA to LAD as well as sequential SVG to diagonal and OM branch.  Since that time, he has had nuclear scan in 2012 which showed EF of 55% with fixed inferior defect.  His nuclear scan in February 2018 showed inferior fixed defect without ischemia.  His EF was noted to be 52%.  Because of bradycardia, he did have Holter monitor in February 2018 which showed baseline sinus bradycardia without symptoms.  His minimum heart rate was 41 bpm.  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  He does have history hypertension, and hyperlipidemia.  He denies active tobacco use.  He has chronic renal disease with his last creatinine in July 2020 to be about 1.65 from baseline of 1.36.  Diuretic regimen was decreased at that time.  In March 2022, he had nuclear scan performed which revealed EF 52% with medium size inferior/inferolateral fixed defect.  This was unchanged from 2012 study.    CAD:    Symptomatically stable.  BP:    Mildly elevated at 150/88.  I did increase his Cozaar from 50 mg twice daily to 100 mg in the morning and 50 mg in the evening.  Rhythm:    Asymptomatic sinus bradycardia 52 bpm.  CHF:    There is no evidence of decompensated CHF noted.  Weight:     His weight today is 189 pounds.   This is unchanged from previous.  Cholesterol:   Target LDL <70.  He remains on Zocor 40 mg daily.  Anti-platelet:   Remains on ASA.      I will see him back in 6 months.  Thank you.    Orders Placed This Encounter   Procedures    EKG 12 Lead     Order Specific Question:   Reason for Exam?     Answer:   Irregular heart rate        HPI  Today, Raymond [name] has no complaints of chest pains.  He has noted gradually increasing dyspnea on exertion and decreasing exercise capacity.  He tries remain active physically.  He has been working on trying to maintain his weight or losing some weight.  He continues to have bilateral lower extremity mild swelling.  He denies any orthopnea or PND.  He denies any palpitations or dizziness.    Review of Systems  14 point review of system is negative unless mentioned in HPI    Physical Exam  Vitals and nursing note reviewed.   Constitutional:       Appearance: Normal appearance.   HENT:      Head: Normocephalic.   Eyes:      Conjunctiva/sclera: Conjunctivae normal.   Neck:  Vascular: No carotid bruit.   Cardiovascular:      Rate and Rhythm: Regular rhythm. Bradycardia present.   Pulmonary:      Breath sounds: Normal breath sounds.   Abdominal:      Palpations: Abdomen is soft.   Musculoskeletal:         General: Swelling present.      Cervical back: No rigidity.   Skin:     General: Skin is warm and dry.   Neurological:      General: No focal deficit present.      Mental Status: He is alert and oriented to person, place, and time.   Psychiatric:         Mood and Affect: Mood normal.         Behavior: Behavior normal.          PCP: Bennie Hind, MD    Past Medical History:   Diagnosis Date    BPH with obstruction/lower urinary tract symptoms     CAD (coronary artery disease)     IMI w stent of RCA 9/03; s/p CABG X3 1/16    CKD (chronic kidney disease)     Colon polyp 05/09/2019    Dr Hart Rochester    COVID-19 vaccine dose declined 04/2021    boosters    Diastasis recti     Dyslipidemia      Elevated prostate specific antigen (PSA)     H/O cardiac catheterization 02/29/2004    oRCA 100%.  LM patent.  p/mLAD 85%.  D1 90%.  D2 90%.  CX 45%.  LVEDP 12 mmHg.  EF 45%.  CABG recommended.    H/O cardiovascular stress test     Intermediate risk.  Mild-mod basal inf infarct w/mild-mod peri-infarct isch  mid inf, prox & mid inf/lat walls.  EF unreadable (3/15); ef 56%, tid 0.96, inf/lat fixed defect, neg isch (3/22)    H/O echocardiogram     LVE.  EF 45-50%.  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 Sequential SVG to the diagonal and obtuse marginal branch    OTHER SURGICAL HISTORY  2015    s/p resection neurofibroma RLE    OTHER SURGICAL HISTORY  2015    R kidney mass removal    TONSILLECTOMY         Current Outpatient Medications   Medication Sig Dispense Refill    Multiple Vitamin (MULTIVITAMIN PO) Take 1 tablet by mouth daily      amLODIPine (NORVASC) 5 MG tablet Take 5 mg by mouth daily      carvedilol (COREG) 12.5 MG tablet TAKE 1 TABLET BY MOUTH TWICE DAILY WITH MEALS      Icosapent Ethyl (  VASCEPA) 1 g CAPS capsule Take 2 capsules by mouth 2 times daily (with meals)      losartan (COZAAR) 50 MG tablet TAKE 1 TABLET BY MOUTH TWICE DAILY      simvastatin (ZOCOR) 40 MG tablet Take 40 mg by mouth      aspirin 81 MG chewable tablet Take 81 mg by mouth daily      triamcinolone (NASACORT) 55 MCG/ACT nasal inhaler INSTILL 2 SPRAYS  INTO EACH NOSTRIL DAILY       No current facility-administered medications for this visit.       The patient has a family history of    Social History     Tobacco Use    Smoking status: Never    Smokeless tobacco: Never   Vaping Use    Vaping Use: Never used   Substance Use Topics    Alcohol use: Yes    Drug use: No       Lab Results   Component Value Date/Time    CHOL 133 10/13/2020 08:12 AM    HDL 58 10/13/2020 08:12 AM        BP Readings from Last 3 Encounters:   04/20/21 (!) 140/70   04/20/21 (!) 141/72   10/20/20 (!) 140/70        Pulse Readings from Last 3 Encounters:   04/20/21 56   04/20/21 57   10/20/20 60       Wt Readings from Last 3 Encounters:   04/27/21 189 lb (85.7 kg)   04/20/21 190 lb (86.2 kg)   04/20/21 190 lb (86.2 kg)         EKG: sinus bradycardia, RBBB, unchanged from previous tracings.     Synetta Shadow, MD   04/27/2021

## 2021-04-27 NOTE — Progress Notes (Signed)
Raymond [name] presents today for   Chief Complaint   Patient presents with    Follow-up     6 month follow up- no complaints            Raymond [name] preferred language for health care discussion is english/other.    Is someone accompanying this pt? no    Is the patient using any DME equipment during OV? no    Depression Screening:  Depression: Not at risk    PHQ-2 Score: 0        Learning Assessment:  Who is the primary learner? Patient    What is the preferred language for health care of the primary learner? ENGLISH    How does the primary learner prefer to learn new concepts? DEMONSTRATION    Answered By patient    Relationship to Learner SELF           Pt currently taking Anticoagulant therapy? no    Pt currently taking Antiplatelet therapy ? ASA 81mg  daily       Coordination of Care:  1. Have you been to the ER, urgent care clinic since your last visit? Hospitalized since your last visit? no    2. Have you seen or consulted any other health care providers outside of the [facility] since your last visit? Include any pap smears or colon screening. no

## 2021-04-27 NOTE — Patient Instructions (Addendum)
Follow up with Dr. Dory Peru in 6 months or sooner if needed.  Increase Losartan (Cozaar) to 100 mg by mouth once in the AM, But take 50 mg by mouth in the PM.

## 2021-05-12 MED ORDER — ICOSAPENT ETHYL 1 G PO CAPS
1 g | ORAL_CAPSULE | ORAL | 11 refills | Status: AC
Start: 2021-05-12 — End: 2022-04-07

## 2021-06-01 MED ORDER — CARVEDILOL 12.5 MG PO TABS
12.5 MG | ORAL_TABLET | ORAL | 0 refills | Status: DC
Start: 2021-06-01 — End: 2021-08-29

## 2021-06-01 NOTE — Telephone Encounter (Signed)
Last OV: 04/20/2021  Next OV: 10/26/2021  Last refill: 06/06/2020

## 2021-08-26 NOTE — Telephone Encounter (Signed)
PCP: Bennie Hind, MD    Previous refill per chart  carvedilol (COREG) 12.5 MG tablet 180 tablet 0 06/01/2021     Sig: TAKE 1 TABLET BY MOUTH TWICE DAILY WITH MEALS    Sent to pharmacy as: Carvedilol 12.5 MG Oral Tablet (COREG)    E-Prescribing Status: Receipt confirmed by pharmacy (06/01/2021  1:45 PM EDT)        Last appt: 04/20/21  Future Appointments   Date Time Provider Department Center   09/30/2021  8:30 AM [facility] NURSE [place] Public Sched   10/07/2021  9:20 AM London Sheer, PA [facility] Sched   10/19/2021  7:55 AM [facility]IT [facility] AM[facility]   10/26/2021  9:00 AM Raymund Rubie Maid, MD [facility] AMB   11/02/2021  9:00 AM Synetta Shadow, MD [facility] BS AMB

## 2021-08-29 MED ORDER — CARVEDILOL 12.5 MG PO TABS
12.5 MG | ORAL_TABLET | ORAL | 3 refills | Status: AC
Start: 2021-08-29 — End: 2022-08-24

## 2021-09-20 MED ORDER — AMLODIPINE BESYLATE 5 MG PO TABS
5 MG | ORAL_TABLET | ORAL | 3 refills | Status: AC
Start: 2021-09-20 — End: 2022-09-24

## 2021-09-30 ENCOUNTER — Encounter: Primary: Internal Medicine

## 2021-09-30 ENCOUNTER — Encounter: Admit: 2021-09-30 | Payer: PRIVATE HEALTH INSURANCE | Primary: Internal Medicine

## 2021-09-30 DIAGNOSIS — N400 Enlarged prostate without lower urinary tract symptoms: Secondary | ICD-10-CM

## 2021-09-30 LAB — PROSTATE SPECIFIC ANTIGEN, TOTAL: PSA: 5.05 ng/mL — ABNORMAL HIGH (ref 0.00–4.00)

## 2021-09-30 NOTE — Progress Notes (Signed)
Raymond [name] presents today for lab draw per PA Aloia order.   PA Tyler Deis was present in the clinic as incident to.     PSA obtained via venipuncture without any difficulty.    Patient will be notified with lab results.       Orders Placed This Encounter   Procedures    Prostate Specific Antigen, Total    PR COLLECTION VENOUS BLOOD VENIPUNCTURE       Raymond Sizer, MA

## 2021-10-03 NOTE — Other (Signed)
Will review at follow up

## 2021-10-07 ENCOUNTER — Encounter: Attending: Physician Assistant | Primary: Internal Medicine

## 2021-10-07 ENCOUNTER — Ambulatory Visit: Admit: 2021-10-07 | Payer: PRIVATE HEALTH INSURANCE | Attending: Physician Assistant | Primary: Internal Medicine

## 2021-10-07 DIAGNOSIS — N400 Enlarged prostate without lower urinary tract symptoms: Secondary | ICD-10-CM

## 2021-10-07 LAB — AMB POC URINALYSIS DIP STICK AUTO W/O MICRO
Bilirubin, Urine, POC: NEGATIVE
Glucose, Urine, POC: NEGATIVE
Ketones, Urine, POC: NEGATIVE
Nitrite, Urine, POC: NEGATIVE
Protein, Urine, POC: NEGATIVE
Specific Gravity, Urine, POC: 1.02 (ref 1.001–1.035)
Urobilinogen, POC: 1
pH, Urine, POC: 5.5 (ref 4.6–8.0)

## 2021-10-07 LAB — AMB POC PVR, MEAS,POST-VOID RES,US,NON-IMAGING: PVR, POC: 1 cc

## 2021-10-07 NOTE — Progress Notes (Signed)
Raymond [name]  07-22-1945        No supervising physician for this day.       Diagnosis Orders   1. Benign prostatic hyperplasia without lower urinary tract symptoms  AMB POC URINALYSIS DIP STICK AUTO W/O MICRO    AMB POC PVR, MEAS,POST-VOID RES,US,NON-IMAGING    Culture, Urine      2. Elevated prostate specific antigen (PSA)  AMB POC URINALYSIS DIP STICK AUTO W/O MICRO    AMB POC PVR, MEAS,POST-VOID RES,US,NON-IMAGING    Culture, Urine      3. Microhematuria  AMB POC PVR, MEAS,POST-VOID RES,US,NON-IMAGING    Culture, Urine    Urinalysis with Microscopic            ASSESSMENT:   UA today: trace blood, trace leuks  PVR today: 1 cc      1. BPH wo LUTS              No medical therapy           2. Elevated PSA              Reviewed PSA from 09/30/21- 5.05  09/30/20- 4.42   07/09/2019 - 3.89ng/mL   04/15/19- 4.87   02/10/19- 5.3              DRE 10/07/21: 40 grams, benign    3. Microhematuria   Send urine for culture and microscopy   If + then CTU and cysto         Patient has undulating PSA- stable, still below max for age range. Recheck PSA in 6 months for stability      RTC in 1 year with PSA prior and DRE, pending PSA, urine    Follow-up and Dispositions    Return in about 6 months (around 04/07/2022) for RTC in 6 months with PSA on nurse schedule, RTC pending PSA.          DISCUSSION:    Chief Complaint   Patient presents with    Benign Prostatic Hypertrophy     Without lower urinary tract symptoms       HISTORY OF PRESENT ILLNESS:  Raymond [name] is a 76 y.o. male  who presents today in follow up for elevated PSA of 5.3ng/mL from 02/10/2019. History of undulating PSA.       Today, the patient is doing well. He notes swelling in legs with increase amlodipine.He notes also drinking a cup of coffee at 9 PM   He is not currently taking any GU  Medications, and has no urinary complaints.     Patient notes he has a hx of a mass in his abdomen which was removed. He notes it wasn't cancerous. This occurred in 2017.       Frequency: q4-5 hrs    Nocturia: 1-2x   Urgency: only if he waits too long, no UUI   Feels like emptying bladder well   FOS: strong, weaker at night      No new bone or back pain. Old left hip pain   No unintentional weight loss   Good appetite      Denies flank pain, gross hematuria, dysuria, asymptomatic for infection. No f/c/n/v.       No family history of prostate, bladder or kidney cancer   Never smoker   Worked in Clinical research associate in [employer]            REVIEW OF LABS & IMAGING:  Lab Results   Component Value Date/Time  PSA 5.05 09/30/2021 08:48 AM    PSA 4.42 09/30/2020 01:04 PM    PSA 3.89 07/09/2019 02:58 PM    PSA 5.3 02/10/2019 11:00 AM    PSA 2.9 09/10/2017 09:35 AM           No flowsheet data found.      Past Medical History:   Diagnosis Date    BPH with obstruction/lower urinary tract symptoms     CAD (coronary artery disease)     IMI w stent of RCA 9/03; s/p CABG X3 1/16    CKD (chronic kidney disease)     Colon polyp 05/09/2019    Dr Hart Rochester    COVID-19 vaccine dose declined 04/2021    boosters    Diastasis recti     Dyslipidemia     Elevated prostate specific antigen (PSA)     H/O cardiac catheterization 02/29/2004    oRCA 100%.  LM patent.  p/mLAD 85%.  D1 90%.  D2 90%.  CX 45%.  LVEDP 12 mmHg.  EF 45%.  CABG recommended.    H/O cardiovascular stress test     Intermediate risk.  Mild-mod basal inf infarct w/mild-mod peri-infarct isch  mid inf, prox & mid inf/lat walls.  EF unreadable (3/15); ef 56%, tid 0.96, inf/lat fixed defect, neg isch (3/22)    H/O echocardiogram     LVE.  EF 45-50%.  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 Sequential SVG to the diagonal and obtuse marginal branch    OTHER SURGICAL HISTORY  2015    s/p resection neurofibroma RLE    OTHER SURGICAL HISTORY  2015    R kidney mass removal    TONSILLECTOMY         Social History     Tobacco Use    Smoking status: Never    Smokeless tobacco: Never   Vaping Use    Vaping Use: Never used   Substance Use Topics    Alcohol use: Yes    Drug use: No       Allergies   Allergen Reactions    Ace Inhibitors Cough     Other reaction(s): Unknown (comments)  Intolerance b/c of dry coughing    Bee Venom Hives       Family History   Problem Relation Age of Onset    Heart Disease Father         x3 heart bypass    Hypertension Sister     Osteoarthritis Mother        Current Outpatient Medications   Medication Sig Dispense Refill    amLODIPine (NORVASC) 5 MG tablet TAKE 1 TABLET BY MOUTH DAILY 90 tablet 3  carvedilol (COREG) 12.5 MG tablet TAKE 1 TABLET BY MOUTH TWICE DAILY WITH MEALS 180 tablet 3    Icosapent Ethyl (VASCEPA) 1 g CAPS capsule TAKE 2 CAPSULES BY MOUTH TWICE DAILY WITH MEALS 120 capsule 11    losartan (COZAAR) 50 MG tablet Take 100 mg (2 tablets) by mouth in the am and 50 mg (1 tablet) by mouth in the PM. 270 tablet 3    Multiple Vitamin (MULTIVITAMIN PO) Take 1 tablet by mouth daily      simvastatin (ZOCOR) 40 MG tablet Take 1 tablet by mouth      aspirin 81 MG chewable tablet Take 1 tablet by mouth daily      triamcinolone (NASACORT) 55 MCG/ACT nasal inhaler INSTILL 2 SPRAYS INTO EACH NOSTRIL DAILY       No current facility-administered medications for this visit.         PHYSICAL EXAMINATION:   Ht 5\' 9"  (1.753 m)   Wt 186 lb (84.4 kg)   BMI 27.47 kg/m    Constitutional: WDWN, Pleasant and appropriate affect, No acute distress.     CV:  No peripheral swelling noted   Respiratory: No  respiratory distress or difficulties   Abdomen:  No abdominal distension noted   GU Male:  10/07/21   12/07/21 normal to visual inspection, no erythema or irritation, Sphincter with good tone, Rectum with no hemorrhoids,  fissures or masses.  Prostate is 40 grams. Anodular and smooth.    Skin: No jaundice.     Neuro/Psych:  Alert and oriented x 3. Affect appropriate.    LE: No edema   MSK: FROM      LABS TODAY:    Recent Results (from the past 12 hour(s))   AMB POC URINALYSIS DIP STICK AUTO W/O MICRO    Collection Time: 10/07/21  9:18 AM   Result Value Ref Range    Color, Urine, POC Yellow     Clarity, Urine, POC Clear     Glucose, Urine, POC Negative Negative    Bilirubin, Urine, POC Negative Negative    Ketones, Urine, POC Negative Negative    Specific Gravity, Urine, POC 1.020 1.001 - 1.035    Blood, Urine, POC Trace Negative    pH, Urine, POC 5.5 4.6 - 8.0    Protein, Urine, POC Negative Negative    Urobilinogen, POC 1 mg/dL     Nitrite, Urine, POC Negative Negative    Leukocyte Esterase, Urine, POC Trace Negative   AMB POC PVR, MEAS,POST-VOID RES,US,NON-IMAGING    Collection Time: 10/07/21  9:29 AM   Result Value Ref Range    PVR, POC 1 cc        A copy of today's office visit with all pertinent imaging results and labs were sent to the referring physician.        12/07/21, PA-C  [facility]   [address] [place], [address]  [place], [place]  P: [phone]   F: [phone]

## 2021-10-08 LAB — URINALYSIS WITH MICROSCOPIC
Bilirubin Urine: NEGATIVE
Blood, Urine: NEGATIVE
Glucose, Ur: NEGATIVE
Ketones, Urine: NEGATIVE
Nitrite, Urine: NEGATIVE
Protein, UA: NEGATIVE
Specific Gravity, UA: 1.018 (ref 1.005–1.030)
Urobilinogen, Urine: 1 mg/dL (ref 0.2–1.0)
pH, UA: 5.5 (ref 5.0–7.5)

## 2021-10-08 LAB — MICROSCOPIC EXAMINATION: Casts UA: NONE SEEN /lpf

## 2021-10-10 LAB — CULTURE, URINE

## 2021-10-11 MED ORDER — SIMVASTATIN 40 MG PO TABS
40 MG | ORAL_TABLET | ORAL | 3 refills | Status: AC
Start: 2021-10-11 — End: ?

## 2021-10-11 NOTE — Other (Signed)
Urine shows white blood cells and possible small infection. Start cefdinir 300 mg BID x 5 days and repeat culture and microscopy

## 2021-10-12 MED ORDER — CEFDINIR 300 MG PO CAPS
300 MG | ORAL_CAPSULE | Freq: Two times a day (BID) | ORAL | 0 refills | Status: AC
Start: 2021-10-12 — End: 2021-10-17

## 2021-10-12 NOTE — Telephone Encounter (Signed)
-----   Message from London Sheer, [place] sent at 10/11/2021  9:09 AM EDT -----  Urine shows white blood cells and possible small infection. Start cefdinir 300 mg BID x 5 days and repeat culture and microscopy

## 2021-10-12 NOTE — Telephone Encounter (Signed)
I spoke with pt and he was aware of results thanked me and call was ended.

## 2021-10-19 ENCOUNTER — Ambulatory Visit: Admit: 2021-10-19 | Discharge: 2021-10-19 | Payer: BLUE CROSS/BLUE SHIELD | Primary: Internal Medicine

## 2021-10-19 ENCOUNTER — Inpatient Hospital Stay: Admit: 2021-10-19 | Payer: BLUE CROSS/BLUE SHIELD | Primary: Internal Medicine

## 2021-10-19 DIAGNOSIS — R7301 Impaired fasting glucose: Secondary | ICD-10-CM

## 2021-10-19 LAB — COMPREHENSIVE METABOLIC PANEL
ALT: 33 U/L (ref 16–61)
AST: 18 U/L (ref 10–38)
Albumin/Globulin Ratio: 1.4 (ref 0.8–1.7)
Albumin: 3.7 g/dL (ref 3.4–5.0)
Alk Phosphatase: 60 U/L (ref 45–117)
Anion Gap: 5 mmol/L (ref 3.0–18)
BUN: 20 MG/DL — ABNORMAL HIGH (ref 7.0–18)
Bun/Cre Ratio: 15 (ref 12–20)
CO2: 27 mmol/L (ref 21–32)
Calcium: 8.6 MG/DL (ref 8.5–10.1)
Chloride: 109 mmol/L (ref 100–111)
Creatinine: 1.33 MG/DL — ABNORMAL HIGH (ref 0.6–1.3)
Est, Glom Filt Rate: 55 mL/min/{1.73_m2} — ABNORMAL LOW (ref >60)
Globulin: 2.7 g/dL (ref 2.0–4.0)
Glucose: 106 mg/dL — ABNORMAL HIGH (ref 74–99)
Potassium: 3.7 mmol/L (ref 3.5–5.5)
Sodium: 141 mmol/L (ref 136–145)
Total Bilirubin: 0.7 MG/DL (ref 0.2–1.0)
Total Protein: 6.4 g/dL (ref 6.4–8.2)

## 2021-10-19 LAB — LIPID PANEL
Chol/HDL Ratio: 2 (ref 0–5.0)
Cholesterol, Total: 133 MG/DL (ref <200)
HDL: 65 MG/DL — ABNORMAL HIGH (ref 40–60)
LDL Calculated: 56.2 MG/DL (ref 0–100)
Triglycerides: 59 MG/DL (ref <150)
VLDL Cholesterol Calculated: 11.8 MG/DL

## 2021-10-19 LAB — HEMOGLOBIN A1C W/O EAG: Hemoglobin A1C: 5.2 % (ref 4.2–5.6)

## 2021-10-21 ENCOUNTER — Encounter: Payer: BLUE CROSS/BLUE SHIELD | Attending: Internal Medicine | Primary: Internal Medicine

## 2021-10-24 ENCOUNTER — Encounter: Admit: 2021-10-24 | Discharge: 2021-10-25 | Primary: Internal Medicine

## 2021-10-24 DIAGNOSIS — R3129 Other microscopic hematuria: Secondary | ICD-10-CM

## 2021-10-24 NOTE — Progress Notes (Signed)
Raymond [name] presents today microUA/culture per PA Aloia's order.   Dr.  York Spaniel was present in the clinic as incident to.     Clean catch urine was sent to outside facility for resulting of microUA/culture  Patient will be notified with lab results.       Orders Placed This Encounter   Procedures    Urine Culture, Routine    Urinalysis W/ Rflx Microscopic       Taytem Ghattas S Azarius Lambson

## 2021-10-25 LAB — URINALYSIS W/ RFLX MICROSCOPIC
Bilirubin Urine: NEGATIVE
Blood, Urine: NEGATIVE
Glucose, Ur: NEGATIVE
Ketones, Urine: NEGATIVE
Leukocyte Esterase, Urine: NEGATIVE
Nitrite, Urine: NEGATIVE
Protein, UA: NEGATIVE
Specific Gravity, UA: 1.015 (ref 1.005–1.030)
Urobilinogen, Urine: 1 mg/dL (ref 0.2–1.0)
pH, UA: 7.5 (ref 5.0–7.5)

## 2021-10-26 ENCOUNTER — Ambulatory Visit
Admit: 2021-10-26 | Discharge: 2021-10-26 | Payer: BLUE CROSS/BLUE SHIELD | Attending: Internal Medicine | Primary: Internal Medicine

## 2021-10-26 DIAGNOSIS — Z0000 Encounter for general adult medical examination without abnormal findings: Secondary | ICD-10-CM

## 2021-10-26 LAB — CULTURE, URINE: Urine Culture, Routine: NO GROWTH

## 2021-10-26 MED ORDER — PREDNISONE 20 MG PO TABS
20 MG | ORAL_TABLET | Freq: Every day | ORAL | 0 refills | Status: AC
Start: 2021-10-26 — End: 2021-10-31

## 2021-10-26 NOTE — Other (Signed)
Urine doesn't show an infection, no need for antibiotics, also without blood. Follow up as scheduled.

## 2021-10-26 NOTE — Progress Notes (Signed)
Chief Complaint   Patient presents with    Follow-up           Raymond [name] presents today for   Chief Complaint   Patient presents with    Follow-up           1. "Have you been to the ER, urgent care clinic since your last visit?  Hospitalized since your last visit?" no    2. "Have you seen or consulted any other health care providers outside of the [facility] since your last visit?" no     3. For patients aged 77-75: Has the patient had a colonoscopy / FIT/ Cologuard? Yes - no Care Gap present      If the patient is male:    4. For patients aged 57-74: Has the patient had a mammogram within the past 2 years? NA - based on age or sex      71. For patients aged 21-65: Has the patient had a pap smear? NA - based on age or sex

## 2021-10-26 NOTE — Progress Notes (Signed)
76 y.o. male who presents for RPE    No cardiovascular complaint, active with yardwork, bowls once weekly, no other set exercise.  Bp is running in the 130s when he checks, Dr Boyd Kerbs had inc the cozaar to 100am/50pm at their encounter in March.  He has pedal edema on amlo 5 but dealing with it    He tweaked his neck/shoulder while bowling last week, took some tylenol and sx improved although he still has vague paresthesia in the right neck.  No weakness, numbness, sx down the arm    No gi issues currently.  Sees NP Aloia for the elevated psa.     Denies polyuria, polydipsia, nocturia, vision change.  Not checking sugars at this time. He's keeping the weight stable    Vitals 04/20/2021 04/20/2021 10/27/2020 10/20/2020 10/08/2020 05/03/2020   Weight 190 lb 190 lb  189 lb 192 lb 192 lb     *LAST MEDICARE WELLNESS EXAM: 09/10/15, 04/07/20, not medicare b    Past Medical History:   Diagnosis Date    BPH with obstruction/lower urinary tract symptoms     CAD (coronary artery disease)     IMI w stent of RCA 9/03; s/p CABG X3 1/16    CKD (chronic kidney disease)     pre2016    Colon polyp 05/09/2019    Dr Kellie Simmering    COVID-19 vaccine dose declined 04/2021    boosters    Diastasis recti     Dyslipidemia     Elevated prostate specific antigen (PSA)     [facility]    H/O cardiac catheterization 02/29/2004    oRCA 100%.  LM patent.  p/mLAD 85%.  D1 90%.  D2 90%.  CX 45%.  LVEDP 12 mmHg.  EF 45%.  CABG recommended.    H/O cardiovascular stress test     Intermediate risk.  Mild-mod basal inf infarct w/mild-mod peri-infarct isch  mid inf, prox & mid inf/lat walls.  EF unreadable (3/15); ef 56%, tid 0.96, inf/lat fixed defect, neg isch (3/22)    H/O echocardiogram     LVE.  EF 45-50%.  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 Sequential SVG to the diagonal and obtuse marginal branch    OTHER SURGICAL HISTORY  2015    s/p resection neurofibroma RLE    OTHER SURGICAL HISTORY  2015    R kidney mass removal    TONSILLECTOMY       Social History     Socioeconomic History    Marital status: Married     Spouse name: Not on file    Number of children: Not on file    Years of education: Not on file    Highest education level: Not on file  Occupational History    Not on file   Tobacco Use    Smoking status: Never    Smokeless tobacco: Never   Vaping Use    Vaping Use: Never used   Substance and Sexual Activity    Alcohol use: Yes    Drug use: No    Sexual activity: Not Currently   Other Topics Concern    Not on file   Social History Narrative    Not on file     Social Determinants of Health     Financial Resource Strain: Low Risk  (10/26/2021)    Overall Financial Resource Strain (CARDIA)     Difficulty of Paying Living Expenses: Not hard at all   Food Insecurity: No Food Insecurity (10/26/2021)    Hunger Vital Sign     Worried About Running Out of Food in the Last Year: Never true     Ran Out of Food in the Last Year: Never true   Transportation Needs: Unknown (10/26/2021)    PRAPARE - Armed forces logistics/support/administrative officer (Medical): Not on file     Lack of Transportation (Non-Medical): No   Physical Activity: Insufficiently Active (04/19/2021)    Exercise Vital Sign     Days of Exercise per Week: 3 days     Minutes of Exercise per Session: 30 min   Stress: Not on file   Social Connections: Not on file   Intimate Partner Violence: Not on file   Housing Stability: Unknown (10/26/2021)    Housing Stability Vital  Sign     Unable to Pay for Housing in the Last Year: Not on file     Number of [place] in the Last Year: Not on file     Unstable Housing in the Last Year: No     Current Outpatient Medications   Medication Sig    predniSONE (DELTASONE) 20 MG tablet Take 1 tablet by mouth daily for 5 days    simvastatin (ZOCOR) 40 MG tablet TAKE 1 TABLET BY MOUTH EVERY NIGHT    amLODIPine (NORVASC) 5 MG tablet TAKE 1 TABLET BY MOUTH DAILY    carvedilol (COREG) 12.5 MG tablet TAKE 1 TABLET BY MOUTH TWICE DAILY WITH MEALS    Icosapent Ethyl (VASCEPA) 1 g CAPS capsule TAKE 2 CAPSULES BY MOUTH TWICE DAILY WITH MEALS    losartan (COZAAR) 50 MG tablet Take 100 mg (2 tablets) by mouth in the am and 50 mg (1 tablet) by mouth in the PM.    Multiple Vitamin (MULTIVITAMIN PO) Take 1 tablet by mouth daily    aspirin 81 MG chewable tablet Take 1 tablet by mouth daily    triamcinolone (NASACORT) 55 MCG/ACT nasal inhaler INSTILL 2 SPRAYS INTO EACH NOSTRIL DAILY     No current facility-administered medications for this visit.     Allergies   Allergen Reactions    Ace Inhibitors Cough     Other reaction(s): Unknown (comments)  Intolerance b/c of dry coughing    Bee Venom Hives     REVIEW OF SYSTEMS: colo 4/21 Dr Kellie Simmering    Vitals:    10/26/21 0917 10/26/21 0921   BP: (!) 153/80 132/72   Pulse: 58    Resp: 16    Temp: 98.2 F (36.8 C)    TempSrc: Temporal    SpO2: 97%    Weight: 190 lb (86.2 kg)    Height: _0  (1.753 m)    A&O x3  Affect is appropriate.  Mood stable  No apparent distress  Anicteric, no JVD, adenopathy or thyromegaly.   No carotid bruits or radiated murmur  Lungs clear to auscultation, no wheezes or rales  Heart showed regular rate and rhythm. No murmur, rubs, gallops  Abdomen soft nontender, no hepatosplenomegaly or masses.  Diastasis noted  Extremities with 1+ ankle edema.  Pulses 1-2+ symmetrically  Borderline spurling on right    LABS  From 2/16 showed gluc 101, cr 1.28, gfr 56,  alt 12,    chol 143, tg 65, hdl 61, ldl-c  69, wbc 5.0, hb 13.3, plt 175, tsh 2.94, ua neg  From 2/17 showed gluc 96,   cr 1.19, gfr>60, alt 29, hba1c 5.4, umar 5.0,  chol 170, tg 65, hdl 66, ldl-c 91, wbc 5.3, hb 13.6, plt 184, tsh 2.92, ua neg  From 2/18 showed gluc 104, cr 1.33, gfr 53,  alt 13, hba1c 5.6, umar 6.0,  chol 121, tg 51, hdl 58, ldl-c 53, wbc 5.2, hb 13.6, plt 177, tsh 3.09  From 8/18 showed gluc 104, cr 1.26, gfr 56,  alt 33,    chol 135, tg 58, hdl 61, ldl-c 62, wbc 6.0, hb 13.6, plt 180  From 8/19 showed gluc 97,   cr 1.36, gfr 51,     chol 134, tg 82, hdl 51, ldl-c 67,                 psa 2.90  From 2/20 showed gluc 96,   cr 1.28, gfr 55,  alt 24,    chol 137, tg 66, hdl 57, ldl-c 67  From 7/20 showed gluc 93,   cr 1.67, gfr 40,  alt 27,     chol 151, tg 95, hdl 58, ldl-c 74  From 1/21 showed gluc 95,   cr 1.74, gfr 39,  alt 25, hba1c 5.5,   chol 134, tg 84, hdl 50, ldl-c 67, wbc 6.0, hb 12.9, plt 194,              psa 5.30  From 4/21 showed gluc 98,   cr 1.37, gfr 51  From 8/21 showed gluc 106, cr 1.28, gfr 55,  alt 21, hba1c 5.3   chol 125, tg 45, hdl 63, ldl-c 53  From 2/22 showed gluc 97,   cr 1.23, gfr 57,  hba1c 5.4,           wbc 5.6, hb 12.0, plt 175  From 3/22 showed                            fe 70, %sat 24, ferritin 70, b12 434, fol>20, spep neg  From 8/22 showed                  psa 4.42  From 9/22 showed gluc 94,   cr 1.46, gfr 47, alt 17, hba1c 5.3,   chol 133, tg 64, hdl 58, ldl-c 62, wbc 6.4, hb 12.9, plt 165  From 3/23 showed gluc 103, cr 1.29, gfr 57,             wbc 5.7, hb 13.1, plt 177    Results for orders placed or performed in visit on 10/24/21 (from the past 2160 hour(s))   Culture, Urine    Urine   Result Value Ref Range    Urine Culture, Routine No growth    Urinalysis W/ Rflx Microscopic   Result Value Ref Range    Specific Gravity,  UA 1.015 1.005 - 1.030    pH, UA 7.5 5.0 - 7.5    Color, Urine Yellow Yellow    Appearance Clear Clear    Leukocyte Esterase, Urine Negative Negative    Protein, UA Negative  Negative/Trace    Glucose, Ur Negative Negative    Ketones, Urine Negative Negative    Blood, Urine Negative Negative    Bilirubin Urine Negative Negative    Urobilinogen, Urine 1.0 0.2 - 1.0 mg/dL    Nitrite, Urine Negative Negative    Microscopic Examination Comment    Results for orders placed or performed during the hospital encounter of 10/19/21 (from the past 2160 hour(s))   Comprehensive Metabolic Panel   Result Value Ref Range    Sodium 141 136 - 145 mmol/L    Potassium 3.7 3.5 - 5.5 mmol/L    Chloride 109 100 - 111 mmol/L    CO2 27 21 - 32 mmol/L    Anion Gap 5 3.0 - 18 mmol/L    Glucose 106 (H) 74 - 99 mg/dL    BUN 20 (H) 7.0 - 18 MG/DL    Creatinine 1.33 (H) 0.6 - 1.3 MG/DL    Bun/Cre Ratio 15 12 - 20      Est, Glom Filt Rate 55 (L) >60 ml/min/1.60m    Calcium 8.6 8.5 - 10.1 MG/DL    Total Bilirubin 0.7 0.2 - 1.0 MG/DL    ALT 33 16 - 61 U/L    AST 18 10 - 38 U/L    Alk Phosphatase 60 45 - 117 U/L    Total Protein 6.4 6.4 - 8.2 g/dL    Albumin 3.7 3.4 - 5.0 g/dL    Globulin 2.7 2.0 - 4.0 g/dL    Albumin/Globulin Ratio 1.4 0.8 - 1.7     HEMOGLOBIN A1C W/O EAG   Result Value Ref Range    Hemoglobin A1C 5.2 4.2 - 5.6 %   Lipid Panel   Result Value Ref Range    LIPID PANEL        Cholesterol, Total 133 <200 MG/DL    Triglycerides 59 <150 MG/DL    HDL 65 (H) 40 - 60 MG/DL    LDL Calculated 56.2 0 - 100 MG/DL    VLDL Cholesterol Calculated 11.8 MG/DL    Chol/HDL Ratio 2.0 0 - 5.0     Results for orders placed or performed in visit on 10/07/21 (from the past 2160 hour(s))   Culture, Urine    Specimen: Urine, clean catch    Urine, clean catch   Result Value Ref Range    Urine Culture, Routine       Mixed urogenital flora  25,000-50,000 colony forming units per mL     Urinalysis with Microscopic   Result Value Ref Range    Specific Gravity, UA 1.018 1.005 - 1.030    pH, UA 5.5 5.0 - 7.5    Color, Urine Yellow Yellow    Appearance Clear Clear    Leukocyte Esterase, Urine 1+ (A) Negative    Protein, UA Negative  Negative/Trace    Glucose, Ur Negative Negative    Ketones, Urine Negative Negative    Blood, Urine Negative Negative    Bilirubin Urine Negative Negative    Urobilinogen, Urine 1.0 0.2 - 1.0 mg/dL    Nitrite, Urine Negative Negative    Microscopic Examination See additional order    Microscopic Examination   Result Value Ref Range    WBC, UA 6-10 (A) 0 -  5 /hpf    RBC, UA 0-2 0 - 2 /hpf    Epithelial Cells, UA 0-10 0 - 10 /hpf    Casts UA None seen None seen /lpf    Bacteria, UA Few None seen/Few   AMB POC URINALYSIS DIP STICK AUTO W/O MICRO   Result Value Ref Range    Color, Urine, POC Yellow     Clarity, Urine, POC Clear     Glucose, Urine, POC Negative Negative    Bilirubin, Urine, POC Negative Negative    Ketones, Urine, POC Negative Negative    Specific Gravity, Urine, POC 1.020 1.001 - 1.035    Blood, Urine, POC Trace Negative    pH, Urine, POC 5.5 4.6 - 8.0    Protein, Urine, POC Negative Negative    Urobilinogen, POC 1 mg/dL     Nitrite, Urine, POC Negative Negative    Leukocyte Esterase, Urine, POC Trace Negative   AMB POC PVR, MEAS,POST-VOID RES,US,NON-IMAGING   Result Value Ref Range    PVR, POC 1 cc   Results for orders placed or performed in visit on 09/30/21 (from the past 2160 hour(s))   Prostate Specific Antigen, Total   Result Value Ref Range    PSA 5.05 (H) 0.00 - 4.00 ng/mL         We reviewed the patient's labs from the last several visits to point out trends in the numbers    Patient Active Problem List   Diagnosis    CKD (chronic kidney disease) stage 3, GFR 30-59 ml/min (HCC)    Coronary atherosclerosis of native coronary artery    Elevated prostate specific antigen (PSA)    BPH with obstruction/lower urinary tract symptoms    Hyperlipidemia    Colon polyp    Primary hypertension    Impaired fasting glucose         Assessment and plan:  1.  CHD.  F/U Dr Boyd Kerbs  2.  BPH/LUTS, elev psa.  F/u PA Aloia  3.  HLP.  Continue current regimen.  4.  HTN.  Continue current   5.  IFG.  Lifestyle and  dietary measures. Further weight loss would be ideal  6.  CKD.  Stable   7.  Colon polyps.  Fiber, colonoscopy 2026  8.  Cervical radiculitis?  Pred 5 days and he will call w update      RTC 3/24    Instructions above were handwritten on the lab results sheet that I gave the patient today    Above conditions discussed at length and patient vocalized understanding.  All questions answered to patient satisfaction     Diagnosis Orders   1. Physical exam  Basic Metabolic Panel    HEMOGLOBIN A1C W/O EAG    CBC with Auto Differential      2. Cervical radiculitis  predniSONE (DELTASONE) 20 MG tablet      3. Primary hypertension        4. Atherosclerosis of native coronary artery of native heart without angina pectoris        5. Impaired fasting glucose  HEMOGLOBIN A1C W/O EAG      6. Stage 3a chronic kidney disease ([place])        7. Hyperlipidemia, unspecified hyperlipidemia type

## 2021-11-02 ENCOUNTER — Telehealth

## 2021-11-02 ENCOUNTER — Ambulatory Visit
Admit: 2021-11-02 | Discharge: 2021-11-02 | Payer: BLUE CROSS/BLUE SHIELD | Attending: Interventional Cardiology | Primary: Internal Medicine

## 2021-11-02 DIAGNOSIS — I2510 Atherosclerotic heart disease of native coronary artery without angina pectoris: Secondary | ICD-10-CM

## 2021-11-02 NOTE — Telephone Encounter (Signed)
We can send to [facility] if interested - likely c spine disease  Sched dr Glennon Mac if amenable

## 2021-11-02 NOTE — Telephone Encounter (Signed)
Patient stating he finished the steroid pack Sunday night and the tingling sensation is slowly returning. Please advise.

## 2021-11-02 NOTE — Progress Notes (Signed)
HISTORY OF PRESENT ILLNESS  Raymond [name]  76 y.o. male     Chief Complaint   Patient presents with    Follow-up     6 months       ASSESSMENT and PLAN    The primary encounter diagnosis was Atherosclerosis of native coronary artery without angina pectoris, unspecified whether native or transplanted heart. Diagnoses of Ventricular tachycardia ([place]) and Shortness of breath were also pertinent to this visit.    Raymond [name], previous Dr. Peggyann Juba patient, has history of CAD.  Back in January 2006, he presented with sinking feeling and nonsustained VT.  Ultimately, he underwent CABG with LIMA to LAD as well as sequential SVG to diagonal and OM branch.  Since that time, he has had nuclear scan in 2012 which showed EF of 55% with fixed inferior defect.  His nuclear scan in February 2018 showed inferior fixed defect without ischemia.  His EF was noted to be 52%.  Because of bradycardia, he did have Holter monitor in February 2018 which showed asymptomatic sinus bradycardia.  His minimum heart rate was 41 bpm.  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  He does have history hypertension, and hyperlipidemia.  He denies active tobacco use.  He has chronic renal disease with his creatinine in July 2020 to be about 1.65 from baseline of 1.36.  Diuretic regimen was decreased at that time.  In March 2022, he had nuclear scan performed which revealed EF 52% with medium size inferior/inferolateral fixed defect.  This was unchanged from 2012 study.    CAD:    Clinically stable.  BP:    Well-controlled at 130/76.  Rhythm:    Asymptomatic sinus bradycardia at 55 bpm.  He has RBBB, first-degree AV block.  CHF:    There is no evidence of decompensated CHF noted.  Weight:     His weight today is 190 pounds.  This is unchanged from previous.  Cholesterol:   Target LDL <70.  Zocor 40 mg daily.  Anti-platelet:   Remains on ASA.      I will see him  back in 6 months.  Thank you.    Orders Placed This Encounter   Procedures    EKG 12 Lead     Order Specific Question:   Reason for Exam?     Answer:   Other        HPI  Today, Raymond [name] has no complaints of chest pains, shortness breath or decreased exercise capacity.  He remains quite active physically.  His only complaint appears to be right facial tingling which may be from cervical radiculopathy.  He denies any exertional chest pains.  Denies any changes in his exercise capacity or DOE.  He denies any orthopnea or PND.  Denies any palpitations or dizziness.    Review of Systems  14 point review of system is negative unless mentioned in HPI    Physical Exam  Vitals and nursing note reviewed.   Constitutional:       Appearance: Normal appearance.   HENT:      Head: Normocephalic.   Eyes:      Conjunctiva/sclera: Conjunctivae normal.   Neck:      Vascular: No carotid bruit.   Cardiovascular:      Rate and Rhythm: Regular rhythm. Bradycardia present.   Pulmonary:      Breath sounds: Normal breath sounds.  Abdominal:      Palpations: Abdomen is soft.   Musculoskeletal:         General: No swelling.      Cervical back: No rigidity.   Skin:     General: Skin is warm and dry.   Neurological:      General: No focal deficit present.      Mental Status: He is alert and oriented to person, place, and time.   Psychiatric:         Mood and Affect: Mood normal.         Behavior: Behavior normal.            PCP: Bennie Hind, MD    Past Medical History:   Diagnosis Date    BPH with obstruction/lower urinary tract symptoms     CAD (coronary artery disease)     IMI w stent of RCA 9/03; s/p CABG X3 1/16    CKD (chronic kidney disease)     pre2016    Colon polyp 05/09/2019    Dr Hart Rochester    COVID-19 vaccine dose declined 04/2021    boosters    Diastasis recti     Dyslipidemia     Elevated prostate specific antigen (PSA)     [facility]    H/O cardiac catheterization 02/29/2004    oRCA 100%.  LM patent.  p/mLAD 85%.  D1 90%.  D2 90%.   CX 45%.  LVEDP 12 mmHg.  EF 45%.  CABG recommended.    H/O cardiovascular stress test     Intermediate risk.  Mild-mod basal inf infarct w/mild-mod peri-infarct isch  mid inf, prox & mid inf/lat walls.  EF unreadable (3/15); ef 56%, tid 0.96, inf/lat fixed defect, neg isch (3/22)    H/O echocardiogram     LVE.  EF 45-50%.  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 Sequential SVG to the diagonal and obtuse marginal branch    OTHER SURGICAL HISTORY  2015    s/p resection neurofibroma RLE    OTHER SURGICAL HISTORY  2015    R kidney mass removal    TONSILLECTOMY         Current Outpatient Medications   Medication Sig Dispense Refill    simvastatin (ZOCOR) 40 MG tablet TAKE 1 TABLET BY MOUTH EVERY NIGHT 90 tablet 3    amLODIPine (NORVASC) 5 MG tablet TAKE 1 TABLET BY MOUTH DAILY 90 tablet 3    carvedilol (COREG) 12.5 MG tablet TAKE 1 TABLET BY MOUTH TWICE DAILY WITH MEALS 180 tablet 3    Icosapent Ethyl (VASCEPA) 1 g CAPS capsule TAKE 2 CAPSULES BY MOUTH TWICE DAILY WITH MEALS 120 capsule 11    losartan (COZAAR) 50 MG tablet Take  100 mg (2 tablets) by mouth in the am and 50 mg (1 tablet) by mouth in the PM. 270 tablet 3    Multiple Vitamin (MULTIVITAMIN PO) Take 1 tablet by mouth daily      aspirin 81 MG chewable tablet Take 1 tablet by mouth daily      triamcinolone (NASACORT) 55 MCG/ACT nasal inhaler INSTILL 2 SPRAYS INTO EACH NOSTRIL DAILY       No current  facility-administered medications for this visit.       The patient has a family history of    Social History     Tobacco Use    Smoking status: Never    Smokeless tobacco: Never   Vaping Use    Vaping Use: Never used   Substance Use Topics    Alcohol use: Yes    Drug use: No       Lab Results   Component Value Date/Time    CHOL 133 10/19/2021 08:20 AM    HDL 65 10/19/2021 08:20 AM        BP Readings from Last 3 Encounters:   11/02/21 130/76   10/26/21 132/72   04/27/21 (!) 150/88        Pulse Readings from Last 3 Encounters:   11/02/21 55   10/26/21 58   04/27/21 52       Wt Readings from Last 3 Encounters:   11/02/21 190 lb (86.2 kg)   10/26/21 190 lb (86.2 kg)   10/07/21 186 lb (84.4 kg)         EKG: sinus bradycardia, RBBB, 1st degree AV block, unchanged from previous tracings.     Synetta Shadow, MD   11/02/2021

## 2021-11-03 NOTE — Telephone Encounter (Signed)
Patient calling and would like to speak to the nurse regarding this issue.    Please call patient back when avliable.

## 2021-11-04 NOTE — Addendum Note (Signed)
Addended by: Albin Felling on: 11/04/2021 01:52 PM     Modules accepted: Orders

## 2021-11-04 NOTE — Telephone Encounter (Signed)
rEferral sent

## 2021-11-04 NOTE — Telephone Encounter (Signed)
Patient called back stating that he would like the referral for [facility].

## 2021-11-14 ENCOUNTER — Ambulatory Visit
Admit: 2021-11-14 | Discharge: 2021-11-14 | Payer: BLUE CROSS/BLUE SHIELD | Attending: Nurse Practitioner | Primary: Internal Medicine

## 2021-11-14 DIAGNOSIS — M542 Cervicalgia: Secondary | ICD-10-CM

## 2021-11-14 NOTE — Progress Notes (Signed)
Raymond [name] presents today for   Chief Complaint   Patient presents with    Neck Pain       Is someone accompanying this pt? no    Is the patient using any DME equipment during OV? no    Coordination of Care:  1. Have you been to the ER, urgent care clinic since your last visit? no  Hospitalized since your last visit? no    2. Have you seen or consulted any other health care providers outside of the [facility] since your last visit? Yes, pcp, urology, and cardiology  Include any pap smears or colon screening. no

## 2021-11-14 NOTE — Progress Notes (Signed)
Chief complaint   Chief Complaint   Patient presents with    Neck Pain       History of Present Illness:  Raymond [name] is a  76 y.o.  male who comes in today referred by Raymond. Foster [name].  He states over the last month and a half he started with right-sided neck pain with numbness and tingling on the right side of his face and behind his ear.  He does not have any pain into the arms or between his shoulder blades.  He denies any injury.  He states if he does a lot of raking that that will increase pain he tried Tylenol arthritis but Raymond. Tyler [name] gave him a 5-day prednisone pack and that did seem to help while he was on it.  The pain is starting to come back mildly now.  He is right-handed.  He denies dropping things, having any balance issues, dexterity issues or handwriting changes.  He describes the neck pain as an aching      Past Medical History:    BPH, CAD, hypertension, hyperlipidemia, chronic kidney disease    Past Surgical History:    Cardiac stent x2 in 2003, CABG times 03/2004, neurofibroma resection, right kidney mass removal, tonsillectomy      Physical Exam: [name] well-developed well-nourished who is alert and oriented with a normal mood and affect.  He has a full weightbearing nonantalgic gait.  Did not use any assist device.  He has a normal tandem gait.  He has 5 5 strength bilateral extremities.  Negative Hoffmann's.  Negative Spurling's.      Assessment and Plan: This is a patient who has right-sided neck pain with some numbness and tingling on the right side of his face.  We did a cervical x-ray.  It demonstrates advanced cervical spondylosis and degenerative disc disease.  I will put him in physical therapy for his neck.  I would not put him on any anti-inflammatories due to his coronary artery disease.  We will see him back after the PET.  If he is not improved we will get a MRI of the cervical spine.        XRAY: 11/14/21   body part: Cervical  side (rt/lt): bilateral  number  of views taken:2  Findings: As above    The x-ray will be officially read by Raymond. Scotty [name] and scanned into the chart.         There are no diagnoses linked to this encounter.    No follow-ups on file.       Medications:  Current Outpatient Medications   Medication Sig Dispense Refill    simvastatin (ZOCOR) 40 MG tablet TAKE 1 TABLET BY MOUTH EVERY NIGHT 90 tablet 3    amLODIPine (NORVASC) 5 MG tablet TAKE 1 TABLET BY MOUTH DAILY 90 tablet 3    carvedilol (COREG) 12.5 MG tablet TAKE 1 TABLET BY MOUTH TWICE DAILY WITH MEALS 180 tablet 3    Icosapent Ethyl (VASCEPA) 1 g CAPS capsule TAKE 2 CAPSULES BY MOUTH TWICE DAILY WITH MEALS 120 capsule 11    losartan (COZAAR) 50 MG tablet Take 100 mg (2 tablets) by mouth in the am and 50 mg (1 tablet) by mouth in the PM. 270 tablet 3    Multiple Vitamin (MULTIVITAMIN PO) Take 1 tablet by mouth daily      aspirin 81 MG chewable tablet Take 1 tablet by mouth daily      triamcinolone (NASACORT) 55 MCG/ACT  nasal inhaler INSTILL 2 SPRAYS INTO EACH NOSTRIL DAILY       No current facility-administered medications for this visit.         Review of systems:    Past Medical History:   Diagnosis Date    BPH with obstruction/lower urinary tract symptoms     CAD (coronary artery disease)     IMI w stent of RCA 9/03; s/p CABG X3 1/16    CKD (chronic kidney disease)     pre2016    Colon polyp 05/09/2019    Raymond [name]    COVID-19 vaccine dose declined 04/2021    boosters    Diastasis recti     Dyslipidemia     Elevated prostate specific antigen (PSA)     [facility]    H/O cardiac catheterization 02/29/2004    oRCA 100%.  LM patent.  p/mLAD 85%.  D1 90%.  D2 90%.  CX 45%.  LVEDP 12 mmHg.  EF 45%.  CABG recommended.    H/O cardiovascular stress test     Intermediate risk.  Mild-mod basal inf infarct w/mild-mod peri-infarct isch  mid inf, prox & mid inf/lat walls.  EF unreadable (3/15); ef 56%, tid 0.96, inf/lat fixed defect, neg isch (3/22)    H/O echocardiogram     LVE.  EF 45-50%.  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 Sequential SVG to the diagonal and obtuse marginal branch    OTHER SURGICAL HISTORY  2015    s/p resection neurofibroma RLE    OTHER SURGICAL HISTORY  2015    R kidney mass removal    TONSILLECTOMY       Social History     Socioeconomic History    Marital status: Married     Spouse name: Not on file    Number of children: Not on file    Years of education: Not on file    Highest education level: Not on file   Occupational History    Not on file   Tobacco Use    Smoking status: Never    Smokeless tobacco: Never   Vaping Use    Vaping Use: Never used   Substance and Sexual Activity    Alcohol use: Yes    Drug use: No    Sexual activity: Not Currently   Other Topics Concern    Not on file   Social History Narrative    Not on file     Social Determinants of Health     Financial Resource Strain: Low Risk  (10/26/2021)    Overall Financial Resource Strain (CARDIA)     Difficulty of Paying Living Expenses: Not hard at all   Food Insecurity: No Food Insecurity (10/26/2021)  Hunger Vital Sign     Worried About Running Out of Food in the Last Year: Never true     Ran Out of Food in the Last Year: Never true   Transportation Needs: Unknown (10/26/2021)    PRAPARE - Armed forces logistics/support/administrative officer (Medical): Not on file     Lack of Transportation (Non-Medical): No   Physical Activity: Insufficiently Active (11/11/2021)    Exercise  Vital Sign     Days of Exercise per Week: 2 days     Minutes of Exercise per Session: 60 min   Stress: Not on file   Social Connections: Not on file   Intimate Partner Violence: Not At Risk (11/11/2021)    Humiliation, Afraid, Rape, and Kick questionnaire     Fear of Current or Ex-Partner: No     Emotionally Abused: No     Physically Abused: No     Sexually Abused: No   Housing Stability: Unknown (10/26/2021)    Housing Stability Vital Sign     Unable to Pay for Housing in the Last Year: Not on file     Number of [place] in the Last Year: Not on file     Unstable Housing in the Last Year: No     Family History   Problem Relation Age of Onset    Heart Disease Father         x3 heart bypass    Hypertension Sister     Osteoarthritis Mother        Physical Exam:  Pulse 53   Temp 97.5 F (36.4 C) (Oral)   Ht 5\' 9"  (1.753 m)   Wt 190 lb (86.2 kg)   SpO2 99% Comment: RA  BMI 28.06 kg/m   Pain Scale: 0 - No pain/10        We have informed Etta Quill to notify us for immediate appointment if he has any worsening neurogical symptoms or if an emergency situation presents, then call 911      Please note that this dictation was completed with Dragon, the computer voice recognition software.  Quite often unanticipated grammatical, syntax, homophones, and other interpretive errors are inadvertently transcribed by the computer software.  Please disregard these errors.  Please excuse any errors that have escaped final proofreading.     [name], APRN - NP

## 2021-11-21 NOTE — Telephone Encounter (Signed)
Patient "advised he hasn't heard anything from [facility] reference to his therapy appointment".

## 2021-11-21 NOTE — Telephone Encounter (Signed)
I called and spoke to someone at [facility]. I was able to confirm that they have received the referral but have been behind on calling the patients to get them scheduled. If the pt calls them, they can get him scheduled for his visits. I called the pt and relayed this information to him. The pt was identified using 2 pt identifiers.  He verbalized understanding and will call them to get scheduled. The phone number was provided.

## 2021-12-01 ENCOUNTER — Encounter: Payer: BLUE CROSS/BLUE SHIELD | Primary: Internal Medicine

## 2021-12-01 DIAGNOSIS — M542 Cervicalgia: Secondary | ICD-10-CM

## 2021-12-01 NOTE — Progress Notes (Signed)
[facility] - [facility] AT [facility]  [address] [place], [place], [place] [address] Phone: [phone] Fax [phone]  Plan of Care / Statement of Necessity for Physical Therapy Services     Patient Name: Raymond [name] DOB: 07/03/1945   Treatment   Diagnosis: M54.2  NECK PAIN Medical Diagnosis: Neck pain [M54.2]  Spondylosis without myelopathy or radiculopathy, cervical region [M47.812]  Other cervical disc degeneration, unspecified cervical region [M50.30]   Onset Date: 11/14/21 - referral date Payor Source: Payor: BCBS / Plan: ANTHEM BCBS FEDERAL / Product Type: *No Product type* /    Referral Source: Raymond Fuller, APRN - * Start of Care [facility]): 12/01/2021   Prior Hospitalization: See medical history Provider #: [phone]   Prior Level of Function: PLOF: Previously independent with all ADLs, able to perform recreational activities without difficulty, Living Situation: Lives with wife, Work Hx: Retired   Comorbidities: hx of heart attack, CAD with CABG in 2006, HTN, hx of skin CA, cataract surgery 2017, right kidney mass removal 2015     Assessment / key information:  Pt is a 76 y.o. male who presents with c/o acute right jaw and face pain accompanied with tingling sensations.  Pt reports reduction in overall symptoms and pain following prescription of steroid pills, and states he is able to alleviate tingling symptoms by brushing his hand across his face. Reported no functional deficits upon evaluations. However, upon exam pt exhibited decreased cervical ROM, bilateral scapular and shoulder strength, without any tenderness to palpation. Pt's tingling symptoms were unable to be elicited. Patient scored 58 on FOTO indicating slightly decreased function and quality of life. Pt would benefit from skilled PT to address above deficits, limitations and impairments, to facilitate a return to prior level of function, increase ability to maintain current participation in  recreational activity, and improve overall quality of life.     Evaluation Complexity:  History:  HIGH Complexity :3+ comorbidities / personal factors will impact the outcome/ POC ; Examination:  HIGH Complexity : 4+ Standardized tests and measures addressing body structure, function, activity limitation and / or participation in recreation  ;Presentation:  MEDIUM Complexity : Evolving with changing characteristics  ;Clinical Decision Making:  MEDIUM Complexity : FOTO score of 26-74 FOTO score = an established functional score where 100 = no disability  Overall Complexity Rating: MEDIUM  Problem List: decrease ROM, decrease strength, decrease ADL/functional abilities, decrease activity tolerance, and decrease flexibility/joint mobility   Treatment Plan may include any combination of the following: 93818 Therapeutic Exercise, 97112 Neuromuscular Re-Education, 97140 Manual Therapy, 97530 Therapeutic Activity, 97535 Self Care/Home Management, 97014 Electrical Stim unattended, 97035 Ultrasound, and 97012 Mechanical Traction  Patient / Family readiness to learn indicated by: asking questions, trying to perform skills, interest, return verbalization , and return demonstration   Persons(s) to be included in education: patient (P)  Barriers to Learning/Limitations: none  Measures taken if barriers to learning present: n/a  Patient Goal (s): "to keep this from getting worse" & "to keep the pain from coming back"  Patient Self Reported Health Status: good  Rehabilitation Potential: good    Short Term Goals: To be accomplished in 4 treatments:  1- Goal: Pt to be compliant with initial HEP to improve performance of ADLs.  Status at last note/certification: To be provided to pt at first follow up  2- Goal: Pt to improve cervical AROM by > 10 degrees in all directions to improve safety when driving.  Status  at last note/certification:                 AROM (deg)  Right Left   Flexion 34   Extension 37   Side Bend 27 32   Rotation  37 48       Long Term Goals: To be accomplished in 16 treatments:  1- Goal: Pt to improve bilateral shoulder strength to 5/5 to increase participation in bowling activities.  Status at last note/certification:    Strength: Right (/5) Left (/5)   GHJ   Flexion 4+ 4+             Abduction 4+ 4+             Extension 5 5             ER 4+ 4+             IR 4+ 4+     2- Goal: Pt to increase bilateral scapular strength to > 4/5 to improve ability to perform overhead task endurance.  Status at last note/certification:   Strength:    Right (/5) Left (/5)   Upper Trapezius  3+ 3+   Middle/Lower Trap 3 3     3- Goal: Pt to improve cervical flexor endurance to > 1 minute to improve ability to maintain prolonged head and neck positioning when bowling.  Status at last note/certification: 30 seconds with trembling  4- Goal: Pt to report no facial or jaw tingling symptoms in the last 3 weeks to improve overall quality of life.  Status at last note/certification: Pt experienced tingling symptoms during evaluation for < 1 min  5- Goal: Pt to report FOTO score of 82 to show improved function and quality of life.  Status at last note/certification: FOTO 58 pts      Frequency / Duration: Patient would benefit from skilled PT 1-2 times per week for up to 16 sessions as needed in this certification period.  Goals will be assigned and reassessed every 10 visits/ 30 days per guidelines .  Patient/ Caregiver education and instruction: Diagnosis, prognosis, self care [x]   Plan of care has been reviewed with PTA    Certification Period: n/a    Raymond [name], PT       12/01/2021       5:08 PM  ===================================================================  I certify that the above Therapy Services are being furnished while the patient is under my care. I agree with the treatment plan and certify that this therapy is necessary.    70 Signature:_________________________   DATE:_________   TIME:________                            Raymond Glassing, APRN - *    ** Signature, Date and Time must be completed for valid certification **  Please sign and return to [facility] or you may fax the signed copy to [phone].  Thank you.

## 2021-12-01 NOTE — Progress Notes (Signed)
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.30]    SUBJECTIVE    Subjective functional status/changes:     Chief Complaint: neck pain and sensation changes on the right side of the face  History/Mechanism of Injury: experienced sensation changes on the right side of his face accompanied with a headache  Current Symptoms/Functional Deficits: tingling sensations below the ear and along his jaw on the right, experiencing voice loss daily around noon,    Aggravating/Alleviating factors:   Pain-  Current: 0/10     Worst: 0/10   Best: 0/10  Previous Treatment/Compliance: received steroid pills - alleviated jaw pain/headaches, and reduced sensation symptoms  Mobility Devices: none  PMHx/Surgical Hx: hx of heart attack, CAD with CABG in 2006, HTN, hx of skin CA, cataract surgery 2017, right kidney mass removal 2015    Diagnostic Tests: []  Lab work [x]  X-rays    []  CT []  MRI     []  Other:  Results:     Work Hx: Retired  : Lives with wife  Household Modifications: none  Hobbies: gardening, bowling, traveling  PLOF: Previously independent with all ADLs, able to perform recreational activities without difficulty, p  Pt Goals: "to keep this from getting worse" & "to keep the pain from coming back"    General Health:  Red Flags Indicated? []  Yes    [x]  No  []  Yes [x]  No Recent weight change (If yes, due to dieting? []  Yes  []  No)   []  Yes [x]  No Persistent cough  []  Yes [x]  No Unremitting pain at night  []   Yes [x]  No Dizziness  []  Yes [x]  No Blurred vision  []  Yes [x]  No Hands more cold or painful in cold weather  []  Yes [x]  No Ringing in ears  []  Yes [x]  No Difficulty swallowing  []  Yes [x]  No Dysfunction of bowel or bladder  []  Yes [x]  No Recent illness within past 3 weeks (i.e, cold, flu)  []  Yes [x]  No Jaw pain    Headaches: Do you have headaches? []  Yes   [x]  No  Details: have been alleviated since steroids injection      OBJECTIVE/EXAMINATION    35 min [x] Eval - untimed                      Therapeutic Procedures:  Tx Min Billable or 1:1 Min (if diff from Tx Min) Procedure, Rationale, Specifics   8 8 [id number] Self Care/Home Management (timed):  improve patient knowledge and understanding of diagnosis/prognosis and physical therapy expectations, procedures and progression  to improve patient's ability to progress to PLOF and address remaining functional goals.  (see flow sheet as applicable)     Details if applicable:     Total  8 Total  8 Reminder: MC/BC bill using total billable min of TIMED therapeutic procedures (example: do  not include dry needle or estim unattended, both untimed codes, in totals to left)       Physical Therapy Evaluation Cervical Spine      OBJECTIVE  Posture: []  WNL  Head Position: forward flexed lower cervical with extended upper cervical  Shoulder/Scapular Position: protracted and elevated    TMJ: []  N/A []  Abnormal - ROM: WNL, negative for S curve/C curve   Palpation: no TTP    Palpation: no TTP    Cervical AROM:                                           AROM (deg)                 Right Left   Flexion 34   Extension 37   Side Bend 27 32   Rotation 37 48     Shoulder AROM:    Seated Shoulder Flexion: WNL  Seated Shoulder Abduction: WNL  Functional ER: Right C7, Left C7  Functional IR: Right T10, Left T12    Strength:   Right (/5) Left (/5)   GHJ   Flexion 4+ 4+             Abduction 4+ 4+             Extension 5 5             ER 4+ 4+             IR 4+ 4+   Upper Trapezius  3+ 3+    Middle/Lower Trap 3 3   Elbow Flexion 4+ 4+   Elbow Extension 5 5     Cervical Flexor Endurance test: 30 seconds with trembling    Special Tests :      Right Left   Compression -     Distraction -   Spurling's - -     Thoracic Spine: [x]  N/A    []  WNL   []  Other:      Upper Limb Tension Tests: [x]  N/A       Ulnar: []  R    []  L    []  +    []  -       Median: []  R    []  L    []  +    []  -       Radial: []  R    []  L    []  +    []  -    Thoracic Outlet Tests: [x]  N/A       Adson's:  []  R    []  L    []  +    []  -       Hyperabduction: []  R    []  L    []  +    []  -       Allen's:  []  R    []  L    []  +    []  -       Roos:  []  R    []  L    []  +    []  -    Objective/Functional Measures: FOTO 58 pts    Other tests/ comments: facial symptoms occurred after ROM measurements, however unable to reproduce with repeated movements in all directions of cervical range.     Justification for Eval Code Complexity:  Patient History : high  Examination see exam  Clinical Presentation: moderate  Clinical Decision Making : FOTO : 58 /100      ASSESSMENT/Changes in Function:      Patient will continue to benefit from skilled PT services to analyze and address functional mobility deficits, analyze and address ROM deficits, analyze and address strength deficits, analyze and address soft tissue restrictions, analyze and cue for proper movement patterns, analyze and modify for postural abnormalities, and instruct in home and community integration to address functional deficits and attain remaining goals.     [x]  SEE POC for goals and assessment     PLAN  []   Upgrade activities as tolerated     [x]   Continue plan of care  []   Update interventions per flow sheet       []   Other:_      , PT 12/01/2021  3:57 PM

## 2021-12-07 ENCOUNTER — Inpatient Hospital Stay: Admit: 2021-12-07 | Payer: BLUE CROSS/BLUE SHIELD | Primary: Internal Medicine

## 2021-12-07 DIAGNOSIS — M542 Cervicalgia: Secondary | ICD-10-CM

## 2021-12-07 NOTE — Progress Notes (Signed)
PHYSICAL / OCCUPATIONAL THERAPY - DAILY TREATMENT NOTE (updated 1/23)    Patient Name: Raymond [name]    Date: 12/07/2021    DOB: 11/19/1945  Insurance: Payor: BCBS / Plan: ANTHEM BCBS FEDERAL / Product Type: *No Product type* /      Patient DOB verified Yes     Visit #   Current / Total 2 16   Time   In / Out 2:40 3:28   Pain   In / Out 0 0   Subjective Functional Status/Changes: Pt reports experiencing facial symptoms only a few times since his initial evaluation.     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 (see flow sheet as applicable)     Details if applicable:       19 19 97112 Neuromuscular Re-Education (timed):  improve balance, coordination, kinesthetic sense, posture, core stability and proprioception to improve patient's ability to develop conscious control of individual muscles and awareness of position of extremities in order to progress to PLOF and address remaining functional goals. (see flow sheet as applicable)     Details if applicable:     48 48 MC BC Totals Reminder: bill using total billable min of TIMED therapeutic procedures (example: do not include dry needle or estim unattended, both untimed codes, in totals to left)  8-22 min = 1 unit; 23-37 min = 2 units; 38-52 min = 3 units; 53-67 min = 4 units; 68-82 min = 5 units   Total Total     [x]   Patient Education billed concurrently with other procedures   [x]  Review HEP    []  Progressed/Changed HEP, detail:    []  Other detail:       Objective Information/Functional Measures/Assessment    Pt  arrived to first follow-up session with no pain and reports of minimal occurrences of facial symptoms since first visit. Exercise performance resulted in moderate change to symptoms during left LS stretch, able to reproduce 2x. Tolerated introduction of initial exercises per flow sheet without adverse affect. Required skilled cues for movement isolation and to limit compensations throughout. Pt educated on initial HEP. Ended session with no change in pain overall. Plan to introduce remaining interventions at [place].      Patient will continue to benefit from skilled PT / OT services to modify and progress therapeutic interventions, analyze and address functional mobility deficits, analyze and address ROM deficits, analyze and address strength deficits, analyze and address soft tissue restrictions, and analyze and cue for proper movement patterns to address functional deficits and attain remaining goals.    Progress toward goals / Updated goals:  []   See Progress Note/Recertification    Short Term Goals: To be accomplished in 4 treatments:  1- Goal: Pt to be compliant with initial HEP to improve performance of ADLs.  Status at last note/certification: To be provided to pt at first follow up  Current: Provided pt with HEP, pt denied any questions (12/07/21)  2- Goal: Pt to improve cervical AROM by > 10 degrees in all directions to improve safety when driving.  Status at last  note/certification:                 AROM (deg)  Right Left   Flexion 34   Extension 37   Side Bend 27 32   Rotation 37 48         Long Term Goals: To be accomplished in 16 treatments:  1- Goal: Pt to improve bilateral shoulder strength to 5/5 to increase participation in bowling activities.  Status at last note/certification:    Strength: Right (/5) Left (/5)   GHJ   Flexion 4+ 4+             Abduction 4+ 4+             Extension 5 5             ER 4+ 4+             IR 4+ 4+      2- Goal: Pt to increase bilateral scapular strength to > 4/5 to improve  ability to perform overhead task endurance.  Status at last note/certification:   Strength:    Right (/5) Left (/5)   Upper Trapezius  3+ 3+   Middle/Lower Trap 3 3      3- Goal: Pt to improve cervical flexor endurance to > 1 minute to improve ability to maintain prolonged head and neck positioning when bowling.  Status at last note/certification: 30 seconds with trembling  4- Goal: Pt to report no facial or jaw tingling symptoms in the last 3 weeks to improve overall quality of life.  Status at last note/certification: Pt experienced tingling symptoms during evaluation for < 1 min  5- Goal: Pt to report FOTO score of 82 to show improved function and quality of life.  Status at last note/certification: FOTO 58 pts      Next PN/ RC due 1/124/23  Auth due NAR    PLAN  - Continue Plan of Care  - Upgrade activities as tolerated    Charlott Holler, [place]    12/07/2021    2:43 PM    Future Appointments   Date Time Provider Department Center   12/09/2021  2:40 PM Langston Masker [facility]   12/12/2021  2:40 PM Valentina Shaggy, PTA [facility]   12/14/2021  2:40 PM Castinado Almon Hercules, PT [facility]   12/19/2021  2:40 PM Valentina Shaggy, PTA [facility]   12/21/2021  2:40 PM Castinado Almon Hercules, PT [facility]   01/04/2022  2:40 PM Charlott Holler, PT [facility]   01/13/2022 10:00 AM Blount, Gerilyn Nestle, APRN - NP [facility] AM[facility]   04/07/2022  9:30 AM [facility]   04/19/2022  8:45 AM [facility] LAB VISIT [facility] BS AMB   04/26/2022  9:20 AM Bennie Hind, MD [facility] BS AMB   05/03/2022  9:20 AM Dory Peru, Richardson Chiquito, MD [facility] BS AMB

## 2021-12-09 ENCOUNTER — Inpatient Hospital Stay: Admit: 2021-12-09 | Payer: BLUE CROSS/BLUE SHIELD | Primary: Internal Medicine

## 2021-12-09 NOTE — Progress Notes (Signed)
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 (see flow sheet as applicable)     Details if applicable:       15 15 97112 Neuromuscular Re-Education (timed):  improve balance, coordination, kinesthetic sense, posture, core stability and proprioception to improve patient's ability to develop conscious control of individual muscles and awareness of position of extremities in order to progress to PLOF and address remaining functional goals. (see flow sheet as applicable)     Details if applicable:            Details if applicable:            Details if applicable:            Details if applicable:     58 45 MC BC Totals Reminder: bill using total billable min of TIMED therapeutic procedures (example: do not include dry needle or estim unattended, both untimed codes, in totals to left)  8-22 min = 1 unit; 23-37 min = 2 units; 38-52 min = 3 units; 53-67 min = 4 units; 68-82 min = 5 units   Total Total     [x]   Patient Education billed concurrently with other procedures   [x]  Review HEP    []  Progressed/Changed HEP, detail:    []  Other detail:       Objective  Information/Functional Measures/Assessment    Patient seen today to address Neck pain [M54.2], Spondylosis without myelopathy or radiculopathy, 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   No onset of facial numbness or tingling  during today's session. Ex's performed with fatigue in lower and mid trap.    Patient will continue to benefit from skilled PT / OT services to modify and progress therapeutic interventions, analyze and address functional mobility deficits, analyze and address ROM deficits, analyze and address strength deficits, analyze and address soft tissue restrictions, analyze and cue for proper movement patterns, analyze and modify for postural abnormalities, analyze and address imbalance/dizziness, and instruct in home and community integration to address functional deficits and attain remaining goals.    Progress toward goals / Updated goals:  []   See Progress Note/Recertification    1- Goal: Pt to be compliant with initial HEP to improve performance of ADLs.  Status at last note/certification: To be provided to pt at first follow up  Current: Provided pt with HEP, pt denied any questions (12/07/21)  2- Goal: Pt to improve cervical AROM by > 10 degrees in all directions to  improve safety when driving.  Status at last note/certification:                 AROM (deg)  Right Left   Flexion 34   Extension 37   Side Bend 27 32   Rotation 37 48         Long Term Goals: To be accomplished in 16 treatments:  1- Goal: Pt to improve bilateral shoulder strength to 5/5 to increase participation in bowling activities.  Status at last note/certification:    Strength: Right (/5) Left (/5)   GHJ   Flexion 4+ 4+             Abduction 4+ 4+             Extension 5 5             ER 4+ 4+             IR 4+ 4+      2- Goal: Pt to increase bilateral scapular strength to > 4/5 to improve ability to perform overhead task endurance.  Status at last note/certification:    Strength:    Right (/5) Left (/5)   Upper Trapezius  3+ 3+   Middle/Lower Trap 3 3      3- Goal: Pt to improve cervical flexor endurance to > 1 minute to improve ability to maintain prolonged head and neck positioning when bowling.  Status at last note/certification: 30 seconds with trembling  4- Goal: Pt to report no facial or jaw tingling symptoms in the last 3 weeks to improve overall quality of life.  Status at last note/certification: Pt experienced tingling symptoms during evaluation for < 1 min  5- Goal: Pt to report FOTO score of 82 to show improved function and quality of life.  Status at last note/certification: FOTO 58 pts      Next PN/ RC due 1/124/23  Auth due none    PLAN  - Continue Plan of Care    Langston Masker, [place]    12/09/2021    2:44 PM    Future Appointments   Date Time Provider Department Center   12/12/2021  2:40 PM Valentina Shaggy, PTA [facility]   12/14/2021  2:40 PM Castinado Almon Hercules, PT [facility]   12/19/2021  2:40 PM Valentina Shaggy, PTA [facility]   12/21/2021  2:40 PM Castinado Almon Hercules, PT [facility]   01/04/2022  2:40 PM Charlott Holler, PT [facility]   01/13/2022 10:00 AM Blount, Gerilyn Nestle, APRN - NP [facility] BS AMB   04/07/2022  9:30 AM [facility]   04/19/2022  8:45 AM [facility] LAB VISIT [facility] BS AMB   04/26/2022  9:20 AM Bennie Hind, MD [facility] BS AMB   05/03/2022  9:20 AM Dory Peru, Richardson Chiquito, MD [facility] BS AMB

## 2021-12-12 ENCOUNTER — Inpatient Hospital Stay: Admit: 2021-12-12 | Payer: BLUE CROSS/BLUE SHIELD | Primary: Internal Medicine

## 2021-12-12 NOTE — Progress Notes (Signed)
PHYSICAL / OCCUPATIONAL THERAPY - DAILY TREATMENT NOTE (updated 1/23)    Patient Name: Raymond [name]    Date: 12/12/2021    DOB: 09/05/1945  Insurance: Payor: BCBS / Plan: ANTHEM BCBS FEDERAL / Product Type: *No Product type* /      Patient DOB verified Yes     Visit #   Current / Total 4 16   Time   In / Out 2:41 3:20   Pain   In / Out 0/10 0/10   Subjective Functional Status/Changes: Things are going well. I'm not in any pain and I haven't had any tingling in my face this past week.      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 (see flow sheet as applicable)     Details if applicable:       16 16 97112 Neuromuscular Re-Education (timed):  improve balance, coordination, kinesthetic sense, posture, core stability and proprioception to improve patient's ability to develop conscious control of individual muscles and awareness of position of extremities in order to progress to PLOF and address remaining functional goals. (see flow sheet as applicable)     Details if applicable:                    47 39 MC BC Totals Reminder: bill using total billable min of TIMED therapeutic procedures (example: do not include dry needle or estim unattended, both untimed codes, in totals to left)  8-22 min = 1 unit; 23-37 min = 2 units; 38-52 min = 3 units; 53-67 min = 4 units; 68-82 min = 5 units   Total Total     [x]   Patient Education billed concurrently with other procedures   [x]  Review HEP    []  Progressed/Changed HEP, detail:    []  Other detail:       Objective  Information/Functional Measures/Assessment    Pt reports to skilled therapy session with increased neck pain with ADLs. Tactile cues were provided during face pulls to limit bilateral shoulder internal rotations  and strengthen the shoulder external rotators. Pt is making progress towards his Ltgs and was able to increase his objective cervical ROM measurements. Pt was able to increase repetitions during supine thera-band exercises to improve functional UE strength. Session tolerated well.     Patient will continue to benefit from skilled PT / OT services to modify and progress therapeutic interventions, analyze and address functional mobility deficits, analyze and address ROM deficits, analyze and address strength deficits, analyze and address soft tissue restrictions, analyze and cue for proper movement patterns, and analyze and modify for postural abnormalities to address functional deficits and attain remaining goals.    Progress toward goals / Updated goals:  []   See Progress Note/Recertification    1- Goal: Pt to be compliant with initial HEP to improve performance of ADLs.  Status at last note/certification: To be provided to pt at first follow up  Current: Provided pt with HEP, pt denied any questions (12/07/21)  2- Goal: Pt to improve cervical AROM by > 10  degrees in all directions to improve safety when driving.  Status at last note/certification:                 AROM (deg)  Right Left   Flexion 34   Extension 37   Side Bend 27 32   Rotation 37 48    Current: progressing: (12/12/2021)   AROM (deg)  Right Left   Flexion 47   Extension 41   Side Bend 22 19   Rotation 45 55        Long Term Goals: To be accomplished in 16 treatments:  1- Goal: Pt to improve bilateral shoulder strength to 5/5 to increase participation in bowling activities.  Status at last note/certification:    Strength: Right (/5) Left (/5)   GHJ   Flexion 4+ 4+             Abduction 4+ 4+             Extension 5 5             ER 4+ 4+              IR 4+ 4+      2- Goal: Pt to increase bilateral scapular strength to > 4/5 to improve ability to perform overhead task endurance.  Status at last note/certification:   Strength:    Right (/5) Left (/5)   Upper Trapezius  3+ 3+   Middle/Lower Trap 3 3      3- Goal: Pt to improve cervical flexor endurance to > 1 minute to improve ability to maintain prolonged head and neck positioning when bowling.  Status at last note/certification: 30 seconds with trembling  4- Goal: Pt to report no facial or jaw tingling symptoms in the last 3 weeks to improve overall quality of life.  Status at last note/certification: Pt experienced tingling symptoms during evaluation for < 1 min  5- Goal: Pt to report FOTO score of 82 to show improved function and quality of life.  Status at last note/certification: FOTO 58 pts      Next PN/ RC due 12/30/2021  Auth due NAR    PLAN  - Continue Plan of Care    Valentina Shaggy, [place]    12/12/2021    2:43 PM    Future Appointments   Date Time Provider Department Center   12/14/2021  2:40 PM Charlott Holler, [place] [facility]   12/19/2021  2:40 PM Valentina Shaggy, PTA [facility]   12/21/2021  2:40 PM Castinado Almon Hercules, PT [facility]   01/04/2022  2:40 PM Charlott Holler, PT [facility]   01/13/2022 10:00 AM Blount, Gerilyn Nestle, APRN - NP [facility] BS AMB   04/07/2022  9:30 AM [facility]   04/19/2022  8:45 AM [facility] LAB VISIT [facility] BS AMB   04/26/2022  9:20 AM Bennie Hind, MD [facility] BS AMB   05/03/2022  9:20 AM Dory Peru, Richardson Chiquito, MD [facility] BS AMB

## 2021-12-14 ENCOUNTER — Encounter: Payer: BLUE CROSS/BLUE SHIELD | Primary: Internal Medicine

## 2021-12-16 ENCOUNTER — Inpatient Hospital Stay: Admit: 2021-12-16 | Payer: BLUE CROSS/BLUE SHIELD | Primary: Internal Medicine

## 2021-12-16 NOTE — Progress Notes (Signed)
PHYSICAL / OCCUPATIONAL THERAPY - DAILY TREATMENT NOTE (updated 1/23)    Patient Name: Raymond [name]    Date: 12/16/2021    DOB: 1945-04-20  Insurance: Payor: BCBS / Plan: ANTHEM BCBS FEDERAL / Product Type: *No Product type* /      Patient DOB verified Yes     Visit #   Current / Total 5 16   Time   In / Out 11:40 12:22   Pain   In / Out 0 0   Subjective Functional Status/Changes: Pt reports increased ease with looking over his shoulder when reversing the car.     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 (see flow sheet as applicable)     Details if applicable:       14 14 97112 Neuromuscular Re-Education (timed):  improve balance, coordination, kinesthetic sense, posture, core stability and proprioception to improve patient's ability to develop conscious control of individual muscles and awareness of position of extremities in order to progress to PLOF and address remaining functional goals. (see flow sheet as applicable)     Details if applicable:     42 42 MC BC Totals Reminder: bill using total billable min of TIMED therapeutic procedures (example: do not include dry needle or estim unattended, both untimed codes, in totals to left)  8-22 min = 1 unit; 23-37 min = 2 units; 38-52 min = 3 units; 53-67 min = 4 units; 68-82 min = 5 units   Total Total     [x]   Patient Education billed concurrently with other procedures   [x]  Review HEP    []  Progressed/Changed HEP, detail:    []  Other detail:       Objective Information/Functional Measures/Assessment    Pt  arrived to session with no pain and reports of improved ability to look over shoulder when reversing. Exercise performance resulted in no change to symptoms. Tolerated progressions per flow sheet without adverse affect. Required skilled cues for isolation cervical side bending/retraction and to increase shoulder ER during face pulls. Ended session with pt denial for modalities and no change in pain overall. Plan to continue to progress per POC at [place].      Patient will continue to benefit from skilled PT / OT services to modify and progress therapeutic interventions, analyze and address functional mobility deficits, analyze and address ROM deficits, analyze and address strength deficits, analyze and address soft tissue restrictions, and analyze and cue for proper movement patterns to address functional deficits and attain remaining goals.    Progress toward goals / Updated goals:  []   See Progress Note/Recertification    1- Goal: Pt to be compliant with initial HEP to improve performance of ADLs.  Status at last note/certification: To be provided to pt at first follow up  Current: Provided pt with HEP, pt denied any questions (12/07/21)  2- Goal: Pt to improve cervical AROM by > 10 degrees in all directions to improve safety when driving.  Status at last note/certification:  AROM (deg)  Right Left   Flexion 34   Extension 37   Side Bend 27 32   Rotation 37 48    Current: progressing: (12/12/2021)   AROM (deg)  Right Left   Flexion 47   Extension 41   Side Bend 22 19   Rotation 45 55         Long Term Goals: To be accomplished in 16 treatments:  1- Goal: Pt to improve bilateral shoulder strength to 5/5 to increase participation in bowling activities.  Status at last note/certification:    Strength: Right (/5) Left (/5)   GHJ   Flexion 4+ 4+             Abduction 4+ 4+             Extension 5 5             ER 4+ 4+             IR 4+ 4+      2- Goal: Pt to increase bilateral scapular strength to > 4/5 to  improve ability to perform overhead task endurance.  Status at last note/certification:   Strength:    Right (/5) Left (/5)   Upper Trapezius  3+ 3+   Middle/Lower Trap 3 3      3- Goal: Pt to improve cervical flexor endurance to > 1 minute to improve ability to maintain prolonged head and neck positioning when bowling.  Status at last note/certification: 30 seconds with trembling  4- Goal: Pt to report no facial or jaw tingling symptoms in the last 3 weeks to improve overall quality of life.  Status at last note/certification: Pt experienced tingling symptoms during evaluation for < 1 min  Current: Progressing - pt reports no numbness/tingling in the last 9 days (12/16/21)  5- Goal: Pt to report FOTO score of 82 to show improved function and quality of life.  Status at last note/certification: FOTO 58 pts      Next PN/ RC due 12/30/21  Auth due [place] activities as tolerated    [name], PT    12/16/2021    11:48 AM    Future Appointments   Date Time Provider [place]   12/19/2021  2:40 PM Royal Hawthorn, PTA [facility]   12/21/2021  2:40 PM Castinado Joan Flores, PT [facility]   01/04/2022  2:40 PM [name], PT [facility]   01/13/2022 10:00 AM Blount, Melene Plan, APRN - NP [facility] BS AMB   04/07/2022  9:30 AM [facility]   04/19/2022  8:45 AM [facility] LAB VISIT [facility] BS AMB   04/26/2022  9:20 AM Albin Felling, MD [facility] BS AMB   05/03/2022  9:20 AM Boyd Kerbs, Nicole Kindred, MD [facility] BS AMB

## 2021-12-19 ENCOUNTER — Inpatient Hospital Stay: Admit: 2021-12-19 | Payer: BLUE CROSS/BLUE SHIELD | Primary: Internal Medicine

## 2021-12-19 NOTE — Progress Notes (Signed)
PHYSICAL / OCCUPATIONAL THERAPY - DAILY TREATMENT NOTE (updated 1/23)    Patient Name: Raymond [name]    Date: 12/19/2021    DOB: 09/05/1945  Insurance: Payor: BCBS / Plan: ANTHEM BCBS FEDERAL / Product Type: *No Product type* /      Patient DOB verified Yes     Visit #   Current / Total 6 16   Time   In / Out 2:33 3:19   Pain   In / Out 0/10 0/10   Subjective Functional Status/Changes: Things are going well. I'm enjoying the addition movement. I'm getting in my neck.      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 (see flow sheet as applicable)     Details if applicable:       16 16 97112 Neuromuscular Re-Education (timed):  improve balance, coordination, kinesthetic sense, posture, core stability and proprioception to improve patient's ability to develop conscious control of individual muscles and awareness of position of extremities in order to progress to PLOF and address remaining functional goals. (see flow sheet as applicable)     Details if applicable:                    36 46 MC BC Totals Reminder: bill using total billable min of TIMED therapeutic procedures (example: do not include dry needle or estim unattended, both untimed codes, in totals to left)  8-22 min = 1 unit; 23-37 min = 2 units; 38-52 min = 3 units; 53-67 min = 4 units; 68-82 min = 5 units   Total Total     _0   Patient Education billed concurrently with other procedures   _1  Review HEP    _2  Progressed/Changed HEP, detail:    _3  Other detail:       Objective Information/Functional  Measures/Assessment    Pt reports to skilled therapy session with decreased functional cervical ROM. Pt was able to increase resistance during thera-band rows/ extensions and lower trap lift-offs at wall to improve scapular stability and decrease pain with overhead tasks. Skilled cues provided during supine thera-band exercises to limit compensations and promote proper technique. Pt is making progress towards his Ltgs and was able to improve both his cervical flexor endurance time as well as his objective UE strength test. Session tolerated well with no increases in symptoms.     Patient will continue to benefit from skilled PT / OT services to modify and progress therapeutic interventions, analyze and address functional mobility deficits, analyze and address ROM deficits, analyze and address strength deficits, analyze and address soft tissue restrictions, analyze and cue for proper movement patterns, and analyze and modify for postural abnormalities to address functional deficits and attain remaining goals.    Progress toward goals / Updated goals:  _4   See Progress Note/Recertification    1- Goal: Pt to be compliant with initial HEP to improve performance of ADLs.  Status at last note/certification: To be provided to pt at first follow up  Current: Provided pt with HEP, pt denied any questions (12/07/21)  2- Goal: Pt to improve cervical AROM by > 10 degrees in all directions to improve safety when driving.  Status at last note/certification:                 AROM (deg)  Right Left   Flexion 34   Extension 37   Side Bend 27 32   Rotation 37 48    Current: progressing: (12/12/2021)   AROM (deg)  Right Left   Flexion 47   Extension 41   Side Bend 22 19   Rotation 45 55         Long Term Goals: To be accomplished in 16 treatments:  1- Goal: Pt to improve bilateral shoulder strength to 5/5 to increase participation in bowling activities.  Status at last note/certification:    Strength: Right (/5) Left (/5)   GHJ   Flexion  4+ 4+             Abduction 4+ 4+             Extension 5 5             ER 4+ 4+             IR 4+ 4+      2- Goal: Pt to increase bilateral scapular strength to > 4/5 to improve ability to perform overhead task endurance.  Status at last note/certification:   Strength:    Right (/5) Left (/5)   Upper Trapezius  3+ 3+   Middle/Lower Trap 3 3    Current:progressing:  (12/09/2021)  Strength:    Right (/5) Left (/5)   Upper Trapezius  5 5   Middle/Lower Trap 4/ 4- 4/4     3- Goal: Pt to improve cervical flexor endurance to > 1 minute to improve ability to maintain prolonged head and neck positioning when bowling.  Status at last note/certification: 30 seconds with trembling  Current: MET: 1 minute with minor increases in fatigue/ trembling, but no pain. (12/19/2021)  4- Goal: Pt to report no facial or jaw tingling symptoms in the last 3 weeks to improve overall quality of life.  Status at last note/certification: Pt experienced tingling symptoms during evaluation for < 1 min  Current: Progressing - pt reports no numbness/tingling in the last 9 days (12/16/21)  5- Goal: Pt to report FOTO score of 82 to show improved function and quality of life.  Status at last note/certification: FOTO 58 pts      Next PN/ RC due 12/30/2021  Auth due [place], [place]    12/19/2021    2:34 PM    Future Appointments   Date Time Provider [place]   12/19/2021  2:40 PM Royal Hawthorn, PTA [facility]   12/21/2021  2:40 PM Castinado Joan Flores, PT [facility]   01/04/2022  2:40 PM [name], PT [facility]   01/13/2022 10:00 AM Blount, Melene Plan, APRN - NP [facility] BS AMB   04/07/2022  9:30 AM [facility]   04/19/2022  8:45 AM [facility] LAB VISIT [facility] BS AMB   04/26/2022  9:20 AM Albin Felling, MD [facility] BS AMB   05/03/2022  9:20 AM Boyd Kerbs, Nicole Kindred, MD [facility] BS AMB

## 2021-12-21 ENCOUNTER — Inpatient Hospital Stay: Admit: 2021-12-21 | Payer: BLUE CROSS/BLUE SHIELD | Primary: Internal Medicine

## 2021-12-21 NOTE — Progress Notes (Cosign Needed)
[place] [facility] AT 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  Status at last note/certification: To be provided to pt at first follow up  Current: Progressing - Provided pt with HEP, pt denied any questions (12/07/21)     2- Goal: Pt to improve cervical AROM by > 10 degrees in all directions to improve safety when driving.  Status at last note/certification:                 AROM (deg)  Right Left   Flexion 34   Extension 37   Side Bend 27 32   Rotation 37 48    Current: progressing: (12/12/2021)   AROM (deg)  Right Left   Flexion 47   Extension 41   Side Bend 22 19   Rotation 45 55         Long Term Goals: To be accomplished in 16 treatments:  1- Goal: Pt to improve bilateral shoulder strength to 5/5 to increase participation in bowling activities.  Status at last note/certification:    Strength: Right (/5) Left (/5)   GHJ   Flexion 4+ 4+             Abduction 4+ 4+             Extension 5 5             ER 4+ 4+             IR 4+ 4+    Current: Progressing (12/21/21)    Strength: Right (/5) Left (/5)   GHJ   Flexion 5 5             Abduction 4+ 5             Extension 5 5             ER 5 5             IR 4+ 5      2- Goal: Pt to increase bilateral scapular strength to > 4/5 to improve ability to perform overhead task endurance.  Status at last  note/certification:   Strength:    Right (/5) Left (/5)   Upper Trapezius  3+ 3+   Middle/Lower Trap 3 3    Current:progressing:  (12/09/2021)   Strength: Right (/5) Left (/5)   Upper Trapezius  5 5   Middle/Lower Trap 4/ 4- 4/4      3- Goal: Pt to improve cervical flexor endurance to > 1 minute to improve ability to maintain prolonged head and neck positioning when bowling.  Status at last note/certification: 30 seconds with trembling  Current: MET: 1 minute with minor increases in fatigue/ trembling, but no pain. (12/19/2021)     4-  Goal: Pt to report no facial or jaw tingling symptoms in the last 3 weeks to improve overall quality of life.  Status at last note/certification: Pt experienced tingling symptoms during evaluation for < 1 min  Current: Progressing - pt reports no numbness/tingling in the last 14 days (12/21/21)     5- Goal: Pt to report FOTO score of 82 to show improved function and quality of life.  Status at last note/certification: FOTO 58 pts  Current: Currently Met - FOTO 82 pts (12/21/21)      SUMMARY OF TREATMENT  Pt is a 76 y.o. 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 Pt has attended skilled therapy for 7 visits and has made significant progress thus far. Pt reports the following functional gains: better able to look over shoulder, feel stronger. These gains have resulted in an overall reported improvement of 80%. Objective progress can be seen with improved cervical AROM and shoulder/scapular strength, wit improved FOTO scores. Pt denies any functional limitations. Persistent deficits are present with limited cervical AROM and shoulder/scapular strength. Pt would continue to benefit from skilled therapy to address persistent limitations/impairments, and to continue to facilitate improved level of function and quality of life.      Non-Medicare, can  change goals, can adjust or add frequency duration, no signature required      New Goals to be achieved in __4__ treatments  1- Goal: Pt to be compliant with initial HEP to improve performance of ADLs.  Status at last note/certification: Provided pt with HEP, pt denied any questions (12/07/21)  Current:      2- Goal: Pt to improve cervical AROM by > 10 degrees in all directions to improve safety when driving.  Status at last note/certification: progressing: (12/12/2021)   AROM (deg)  Right Left   Flexion 47   Extension 41   Side Bend 22 19   Rotation 45 55    Current:       3 Goal: Pt to improve bilateral shoulder strength to 5/5 to increase participation in bowling activities.  Status at last note/certification: Progressing (12/21/21)    Strength: Right (/5) Left (/5)   GHJ   Flexion 5 5             Abduction 4+ 5             Extension 5 5             ER 5 5             IR 4+ 5    Current:      4- Goal: Pt to increase bilateral scapular strength to > 4/5 to improve ability to perform overhead task endurance.  Status at last note/certification: progressing:  (12/09/2021)   Strength: Right (/5) Left (/5)   Upper Trapezius  5 5   Middle/Lower Trap 4/ 4- 4/4    Current:     5- Goal: Pt to report no facial or jaw tingling symptoms in the last 3 weeks to improve overall quality of life.  Status at last note/certification: Progressing - pt reports no numbness/tingling in the last 14 days (12/21/21)  Current:      6- Goal: Pt to report final FOTO score of 82 to show improved function and quality of life.  Status at last note/certification: Currently Met - FOTO 82 pts (12/21/21)  Current:  Frequency / Duration:   Patient to be seen   1   times per week for   4    treatments:    RECOMMENDATIONS  We would like to continue therapy for progress to goals stated above.  Continue per initial Plan of Care.    If you have any questions/comments please contact us directly.  Thank you for allowing [place] to assist in the care of your  patient.    [place], [place]       12/21/2021       5:32 PM         Therapist Signature: [name], PT Date: 12/21/2021 Time:  5:22 PM

## 2021-12-21 NOTE — Progress Notes (Signed)
PHYSICAL / OCCUPATIONAL THERAPY - DAILY TREATMENT NOTE (updated 1/23)    Patient Name: Raymond [name]    Date: 12/21/2021    DOB: October 26, 1945  Insurance: Payor: BCBS / Plan: ANTHEM BCBS FEDERAL / Product Type: *No Product type* /      Patient DOB verified Yes     Visit #   Current / Total 7 16   Time   In / Out 2:45 3:27   Pain   In / Out 0 0   Subjective Functional Status/Changes: Pt reports not having felt facial symptoms in a long time.     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 (see flow sheet as applicable)     Details if applicable:       11 11 97112 Neuromuscular Re-Education (timed):  improve balance, coordination, kinesthetic sense, posture, core stability and proprioception to improve patient's ability to develop conscious control of individual muscles and awareness of position of extremities in order to progress to PLOF and address remaining functional goals. (see flow sheet as applicable)     Details if applicable:     16 16 [id number] Therapeutic Activity (timed):  use of dynamic activities replicating functional movements to increase ROM, strength, coordination, balance, and proprioception in order to improve patient's ability to progress to PLOF and address remaining functional goals.  (see flow sheet as applicable)     Details if applicable:     81 42 MC BC Totals Reminder: bill using total billable min of TIMED therapeutic procedures (example: do not include dry needle or estim unattended, both untimed codes, in totals to left)  8-22 min = 1 unit;  23-37 min = 2 units; 38-52 min = 3 units; 53-67 min = 4 units; 68-82 min = 5 units   Total Total     _0   Patient Education billed concurrently with other procedures   _1  Review HEP    _2  Progressed/Changed HEP, detail:    _3  Other detail:       Objective Information/Functional Measures/Assessment    Pt arrived to session with no pain and reports of not having facial numbness/tingling in a long time. Performed reassessment resulting in the following: Functional Gains: better able to look over shoulder, feel stronger ; Functional Deficits: none reported ; % improvement: 80. Exercise performance resulted in no change to symptoms. Tolerated progressions per flow sheet without adverse affect. Required skilled cues for movement isolation with supine Y, for sequence of movements with SNAGS, and to maintain TB tension during y lift off. Pt educated on progress thus far and continuing skilled PT. Ended session with no change in pain. Plan to continue per POC at [place].     Patient will continue to benefit from skilled PT / OT services to modify and progress therapeutic interventions, analyze and address functional mobility deficits, analyze and address ROM deficits, analyze and address strength deficits, analyze and address soft tissue restrictions, analyze and cue for proper movement patterns, and analyze and modify for postural abnormalities to address functional deficits and attain remaining goals.    Progress toward  goals / Updated goals:  _0   See Progress Note/Recertification    1- Goal: Pt to be compliant with initial HEP to improve performance of ADLs.  Status at last note/certification: To be provided to pt at first follow up  Current: Provided pt with HEP, pt denied any questions (12/07/21)    2- Goal: Pt to improve cervical AROM by > 10 degrees in all directions to improve safety when driving.  Status at last note/certification:                 AROM (deg)  Right Left   Flexion 34   Extension 37   Side Bend 27 32    Rotation 37 48    Current: progressing: (12/12/2021)   AROM (deg)  Right Left   Flexion 47   Extension 41   Side Bend 22 19   Rotation 45 55         Long Term Goals: To be accomplished in 16 treatments:  1- Goal: Pt to improve bilateral shoulder strength to 5/5 to increase participation in bowling activities.  Status at last note/certification:    Strength: Right (/5) Left (/5)   GHJ   Flexion 4+ 4+             Abduction 4+ 4+             Extension 5 5             ER 4+ 4+             IR 4+ 4+    Current: Progressing (12/21/21)    Strength: Right (/5) Left (/5)   GHJ   Flexion 5 5             Abduction 4+ 5             Extension 5 5             ER 5 5             IR 4+ 5     2- Goal: Pt to increase bilateral scapular strength to > 4/5 to improve ability to perform overhead task endurance.  Status at last note/certification:   Strength:    Right (/5) Left (/5)   Upper Trapezius  3+ 3+   Middle/Lower Trap 3 3    Current:progressing:  (12/09/2021)   Strength: Right (/5) Left (/5)   Upper Trapezius  5 5   Middle/Lower Trap 4/ 4- 4/4      3- Goal: Pt to improve cervical flexor endurance to > 1 minute to improve ability to maintain prolonged head and neck positioning when bowling.  Status at last note/certification: 30 seconds with trembling  Current: MET: 1 minute with minor increases in fatigue/ trembling, but no pain. (12/19/2021)    4- Goal: Pt to report no facial or jaw tingling symptoms in the last 3 weeks to improve overall quality of life.  Status at last note/certification: Pt experienced tingling symptoms during evaluation for < 1 min  Current: Progressing - pt reports no numbness/tingling in the last 14 days (12/21/21)    5- Goal: Pt to report FOTO score of 82 to show improved function and quality of life.  Status at last note/certification: FOTO 58 pts  Current: Currently Met - FOTO 82 pts (12/21/21)      Next PN/ RC due 12/30/21  Auth due [place]  [place]rose Nakayama, PT     12/21/2021    3:02 PM    Future Appointments   Date Time Provider [place]   01/04/2022  2:40 PM [name], [facility]   01/13/2022 10:00 AM Kennon Holter, Melene Plan, APRN - NP [facility]   04/07/2022  9:30 AM [facility] [place] [facility] Sched   04/19/2022  8:45 AM [facility] LAB VISIT [facility] BS AMB   04/26/2022  9:20 AM Albin Felling, MD [facility] BS AM[facility]   05/03/2022  9:20 AM Boyd Kerbs, Nicole Kindred, MD [facility] BS AMB

## 2022-01-04 ENCOUNTER — Inpatient Hospital Stay: Admit: 2022-01-04 | Payer: BLUE CROSS/BLUE SHIELD | Primary: Internal Medicine

## 2022-01-04 NOTE — Progress Notes (Cosign Needed)
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  Status at last note/certification: Provided pt with HEP, pt denied any questions (12/07/21)  Current: Progressing - pt reports partial compliance (01/04/22)     2- Goal: Pt to improve cervical AROM by > 10 degrees in all directions to improve safety when driving.  Status at last note/certification: progressing: (12/12/2021)   AROM (deg)  Right Left   Flexion 47   Extension 41   Side Bend 22 19   Rotation 45 55    Current: Unable to reassess      3 Goal: Pt to improve bilateral shoulder strength to 5/5 to increase participation in bowling activities.  Status at last note/certification: Progressing (12/21/21)    Strength: Right (/5) Left (/5)   GHJ   Flexion 5 5             Abduction 4+ 5             Extension 5 5             ER 5 5             IR 4+ 5    Current: Unable to reassess     4- Goal: Pt to increase bilateral scapular strength to > 4/5 to improve ability to perform overhead task endurance.  Status at last note/certification: progressing:  (12/09/2021)   Strength: Right (/5) Left (/5)   Upper Trapezius  5 5   Middle/Lower Trap 4/ 4- 4/4    Current: Unable to reassess     5- Goal: Pt to report no facial or jaw tingling symptoms in the last 3 weeks to improve overall quality of life.  Status at last note/certification: Progressing - pt reports no numbness/tingling in the last 14 days  (12/21/21)  Current: Met - Pt reports no facial symptoms in more than 3 weeks (01/04/22)     6- Goal: Pt to report final FOTO score of 82 to show improved function and quality of life.  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 Pt called after visit and asked to discharge secondary to referring provider recommendation and self confidence in current status. Thus, Pt's goals were unable to be further reassessed. Pt will be discharged at this time with no further instructions.  Thank you for this referral.       RECOMMENDATIONS  [x] Discontinue therapy: [] Patient has reached or is progressing toward set goals      [x] Patient is non-compliant or has abdicated      [] Due to lack of appreciable progress towards set goals  If you have any questions/comments please contact us directly at [phone].   Thank you for allowing [place] to assist in the care of your patient.    PTA signature  Charlott Holler, PT Date: 01/18/2022   Therapist Signature: Charlott Holler, PT Time: 10:11 AM   Reporting Period: 12/21/21 to 01/18/22

## 2022-01-04 NOTE — Progress Notes (Signed)
PHYSICAL / OCCUPATIONAL THERAPY - DAILY TREATMENT NOTE (updated 1/23)    Patient Name: Raymond [name]    Date: 01/04/2022    DOB: [birth year]  Insurance: Payor: BCBS / Plan: ANTHEM BCBS FEDERAL / Product Type: *No Product type* /      Patient DOB verified Yes     Visit #   Current / Total 1 9   Time   In / Out 2:40 3:27   Pain   In / Out 0 0   Subjective Functional Status/Changes: Pt reports not having any facial symptoms in a long time.     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 (see flow sheet as applicable)     Details if applicable:       13 13 97112 Neuromuscular Re-Education (timed):  improve balance, coordination, kinesthetic sense, posture, core stability and proprioception to improve patient's ability to develop conscious control of individual muscles and awareness of position of extremities in order to progress to PLOF and address remaining functional goals. (see flow sheet as applicable)     Details if applicable:     37 42 MC BC Totals Reminder: bill using total billable min of TIMED therapeutic procedures (example: do not include dry needle or estim unattended, both untimed codes, in totals to left)  8-22 min = 1 unit; 23-37 min = 2 units; 38-52 min = 3 units; 53-67 min = 4 units; 68-82 min = 5 units   Total Total     _0   Patient Education billed concurrently with other procedures   _1  Review HEP    _2  Progressed/Changed HEP, detail:    _3  Other detail:       Objective Information/Functional Measures/Assessment    Pt arrived to session with no  pain and reports of no facial symptoms in over 3 weeks. Exercise performance resulted in no change to symptoms. Tolerated progressions per flow sheet without adverse affect. Required skilled cues to reduce cervical extension during retraction, to eliminate rotation during UT stretch, and for movement sequence during face pulls. Ended session with no change in pain overall. Plan to continue to progress per POC at [place].      Patient will continue to benefit from skilled PT / OT services to modify and progress therapeutic interventions, analyze and address functional mobility deficits, analyze and address ROM deficits, analyze and address strength deficits, analyze and address soft tissue restrictions, analyze and cue for proper movement patterns, and analyze and modify for postural abnormalities to address functional deficits and attain remaining goals.    Progress toward goals / Updated goals:  _4   See Progress Note/Recertification    1- Goal: Pt to be compliant with initial HEP to improve performance of ADLs.  Status at last note/certification: Provided pt with HEP, pt denied any questions (12/07/21)  Current: Progressing - pt reports partial compliance (01/04/22)     2- Goal: Pt to improve cervical AROM by > 10 degrees in all directions to improve safety when driving.  Status at last note/certification: progressing: (12/12/2021)   AROM (deg)  Right Left  Flexion 47   Extension 41   Side Bend 22 19   Rotation 45 55    Current:       3 Goal: Pt to improve bilateral shoulder strength to 5/5 to increase participation in bowling activities.  Status at last note/certification: Progressing (12/21/21)    Strength: Right (/5) Left (/5)   GHJ   Flexion 5 5             Abduction 4+ 5             Extension 5 5             ER 5 5             IR 4+ 5    Current:      4- Goal: Pt to increase bilateral scapular strength to > 4/5 to improve ability to perform overhead task endurance.  Status at last note/certification: progressing:   (12/09/2021)   Strength: Right (/5) Left (/5)   Upper Trapezius  5 5   Middle/Lower Trap 4/ 4- 4/4    Current:     5- Goal: Pt to report no facial or jaw tingling symptoms in the last 3 weeks to improve overall quality of life.  Status at last note/certification: Progressing - pt reports no numbness/tingling in the last 14 days (12/21/21)  Current: Met - Pt reports no facial symptoms in more than 3 weeks (01/04/22)     6- Goal: Pt to report final FOTO score of 82 to show improved function and quality of life.  Status at last note/certification: Currently Met - FOTO 82 pts (12/21/21)  Current:       Next PN/ RC due 01/19/22  Auth due [place] activities as tolerated    [name], PT    01/04/2022    2:53 PM    Future Appointments   Date Time Provider [place]   01/13/2022 10:00 AM Kennon Holter, Melene Plan, APRN - NP [facility] AMB   04/07/2022  9:30 AM [facility] NURSE [facility] Sched   04/19/2022  8:45 AM [facility] VISIT [facility] AM[facility]   04/26/2022  9:20 AM Albin Felling, MD [facility] AM[facility]   05/03/2022  9:20 AM Boyd Kerbs, Nicole Kindred, MD [facility] BS AMB

## 2022-01-13 ENCOUNTER — Ambulatory Visit
Admit: 2022-01-13 | Discharge: 2022-01-13 | Payer: BLUE CROSS/BLUE SHIELD | Attending: Nurse Practitioner | Primary: Internal Medicine

## 2022-01-13 DIAGNOSIS — M542 Cervicalgia: Secondary | ICD-10-CM

## 2022-01-13 NOTE — Progress Notes (Signed)
Etta Quill presents today for   Chief Complaint   Patient presents with    Neck Pain       Is someone accompanying this pt? no    Is the patient using any DME equipment during OV? no    Depression Screening:       No data to display                Learning Assessment:  No flowsheet data found.    Abuse Screening:       No data to display                Fall Risk  No flowsheet data found.    OPIOID RISK TOOL  No flowsheet data found.    Coordination of Care:  1. Have you been to the ER, urgent care clinic since your last visit? no  Hospitalized since your last visit? no    2. Have you seen or consulted any other health care providers outside of the [facility] since your last visit? no Include any pap smears or colon screening. nol

## 2022-01-13 NOTE — Progress Notes (Signed)
Chief complaint   Chief Complaint   Patient presents with    Neck Pain       History of Present Illness:  Raymond [name] is a  76 y.o.  male who comes in today for follow-up of his right-sided neck pain with numbness and tingling on the right side of his face and behind his ear.  Last visit we put him in physical therapy and he states it has resolved his pain completely.  He states his range of motion is much improved.  He is not experiencing any more the symptoms he was having.  He is very happy with the outcome of therapy and is doing a home exercise program daily.  He denies fever bowel bladder dysfunction.    Physical Exam: [name] well-developed well-nourished who is alert and oriented with a normal mood and affect.  He has a full weightbearing nonantalgic gait.  He has 5 out of 5 strength all upper extremities.  Negative Hoffmann's.      Assessment and Plan: This is a patient who has neck pain has resolved with physical therapy.  He will continue his home exercise program.  We will see him back as needed.    Raymond [name] was seen today for neck pain.    Diagnoses and all orders for this visit:    Neck pain    Cervical spondylosis    DDD (degenerative disc disease), cervical        No follow-ups on file.     PMP has been .reviewed and is appropriate    Medications:  Current Outpatient Medications   Medication Sig Dispense Refill    simvastatin (ZOCOR) 40 MG tablet TAKE 1 TABLET BY MOUTH EVERY NIGHT 90 tablet 3    amLODIPine (NORVASC) 5 MG tablet TAKE 1 TABLET BY MOUTH DAILY 90 tablet 3    carvedilol (COREG) 12.5 MG tablet TAKE 1 TABLET BY MOUTH TWICE DAILY WITH MEALS 180 tablet 3    Icosapent Ethyl (VASCEPA) 1 g CAPS capsule TAKE 2 CAPSULES BY MOUTH TWICE DAILY WITH MEALS 120 capsule 11    losartan (COZAAR) 50 MG tablet Take 100 mg (2 tablets) by mouth in the am and 50 mg (1 tablet) by mouth in the PM. 270 tablet 3    Multiple Vitamin (MULTIVITAMIN PO) Take 1 tablet by mouth daily      aspirin 81 MG  chewable tablet Take 1 tablet by mouth daily      triamcinolone (NASACORT) 55 MCG/ACT nasal inhaler INSTILL 2 SPRAYS INTO EACH NOSTRIL DAILY       No current facility-administered medications for this visit.       Review of systems:    Past Medical History:   Diagnosis Date    BPH with obstruction/lower urinary tract symptoms     CAD (coronary artery disease)     IMI w stent of RCA 9/03; s/p CABG X3 1/16    CKD (chronic kidney disease)     pre2016    Colon polyp 05/09/2019    Dr Hart Rochester    COVID-19 vaccine dose declined 04/2021    boosters    Diastasis recti     Dyslipidemia     Elevated prostate specific antigen (PSA)     [facility]    H/O cardiac catheterization 02/29/2004    oRCA 100%.  LM patent.  p/mLAD 85%.  D1 90%.  D2 90%.  CX 45%.  LVEDP 12 mmHg.  EF 45%.  CABG recommended.  H/O cardiovascular stress test     Intermediate risk.  Mild-mod basal inf infarct w/mild-mod peri-infarct isch  mid inf, prox & mid inf/lat walls.  EF unreadable (3/15); ef 56%, tid 0.96, inf/lat fixed defect, neg isch (3/22)    H/O echocardiogram     LVE.  EF 45-50%.  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 Sequential SVG to the diagonal and obtuse marginal branch    OTHER SURGICAL HISTORY  2015    s/p resection neurofibroma RLE    OTHER SURGICAL HISTORY  2015    R kidney mass removal    TONSILLECTOMY       Social History     Socioeconomic History    Marital status: Married      Spouse name: Not on file    Number of children: Not on file    Years of education: Not on file    Highest education level: Not on file   Occupational History    Not on file   Tobacco Use    Smoking status: Never    Smokeless tobacco: Never   Vaping Use    Vaping Use: Never used   Substance and Sexual Activity    Alcohol use: Yes    Drug use: No    Sexual activity: Not Currently   Other Topics Concern    Not on file   Social History Narrative    Not on file     Social Determinants of Health     Financial Resource Strain: Low Risk  (10/26/2021)    Overall Financial Resource Strain (CARDIA)     Difficulty of Paying Living Expenses: Not hard at all   Food Insecurity: Not on file (10/26/2021)   Transportation Needs: Unknown (10/26/2021)    PRAPARE - Therapist, art (Medical): Not on file     Lack of Transportation (Non-Medical): No   Physical Activity: Insufficiently Active (11/11/2021)    Exercise Vital Sign     Days of Exercise per Week: 2 days     Minutes of Exercise per Session: 60 min   Stress: Not on file   Social Connections: Not on file   Intimate Partner Violence: Not At Risk (11/11/2021)    Humiliation, Afraid, Rape, and Kick questionnaire     Fear of Current or Ex-Partner: No     Emotionally Abused: No     Physically Abused: No     Sexually Abused: No   Housing Stability: Unknown (10/26/2021)    Housing Stability Vital Sign     Unable to Pay for Housing in the Last Year: Not on file     Number of Places Lived in the Last Year: Not on file  Unstable Housing in the Last Year: No     Family History   Problem Relation Age of Onset    Heart Disease Father         x3 heart bypass    Hypertension Sister     Osteoarthritis Mother        Physical Exam:  BP 133/73 (Site: Left Upper Arm, Position: Sitting, Cuff Size: Medium Adult)   Pulse 60   Temp 98.1 F (36.7 C) (Oral)   Resp 18   Ht 1.753 m (5\' 9" )   Wt 86.7 kg (191 lb 3.2 oz)   SpO2 97% Comment: RA  BMI 28.24 kg/m   Pain Scale: 0 - No  pain/10      We have informed Etta Quill to notify us for immediate appointment if he has any worsening neurogical symptoms or if an emergency situation presents, then call 911    Please note that this dictation was completed with Dragon, the computer voice recognition software.  Quite often unanticipated grammatical, syntax, homophones, and other interpretive errors are inadvertently transcribed by the computer software.  Please disregard these errors.  Please excuse any errors that have escaped final proofreading.     [name], APRN - NP

## 2022-02-09 ENCOUNTER — Ambulatory Visit
Admit: 2022-02-09 | Discharge: 2022-02-09 | Payer: BLUE CROSS/BLUE SHIELD | Attending: Internal Medicine | Primary: Internal Medicine

## 2022-02-09 DIAGNOSIS — R490 Dysphonia: Secondary | ICD-10-CM

## 2022-02-09 MED ORDER — OMEPRAZOLE 40 MG PO CPDR
40 MG | ORAL_CAPSULE | Freq: Every day | ORAL | 1 refills | Status: AC
Start: 2022-02-09 — End: ?

## 2022-02-09 NOTE — Progress Notes (Unsigned)
77 y.o. male who presents for RPE    No cardiovascular complaint, active with yardwork, bowls once weekly, no other set exercise.  Bp is running in the 130s when he checks, Dr Dory Peru had inc the cozaar to 100am/50pm at their encounter in March.  He has pedal edema on amlo 5 but dealing with it    He tweaked his neck/shoulder while bowling last week, took some tylenol and sx improved although he still has vague paresthesia in the right neck.  No weakness, numbness, sx down the arm    No gi issues currently.  Sees NP Aloia for the elevated psa.     Denies polyuria, polydipsia, nocturia, vision change.  Not checking sugars at this time. He's keeping the weight stable    Vitals 04/20/2021 04/20/2021 10/27/2020 10/20/2020 10/08/2020 05/03/2020   Weight 190 lb 190 lb  189 lb 192 lb 192 lb     *LAST MEDICARE WELLNESS EXAM: 09/10/15, 04/07/20, not medicare b    Past Medical History:   Diagnosis Date    BPH with obstruction/lower urinary tract symptoms     CAD (coronary artery disease)     IMI w stent of RCA 9/03; s/p CABG X3 1/16    CKD (chronic kidney disease)     pre2016    Colon polyp 05/09/2019    Dr Hart Rochester    COVID-19 vaccine dose declined 04/2021    boosters    Diastasis recti     Dyslipidemia     Elevated prostate specific antigen (PSA)     [facility]    H/O cardiac catheterization 02/29/2004    oRCA 100%.  LM patent.  p/mLAD 85%.  D1 90%.  D2 90%.  CX 45%.  LVEDP 12 mmHg.  EF 45%.  CABG recommended.    H/O cardiovascular stress test     Intermediate risk.  Mild-mod basal inf infarct w/mild-mod peri-infarct isch  mid inf, prox & mid inf/lat walls.  EF unreadable (3/15); ef 56%, tid 0.96, inf/lat fixed defect, neg isch (3/22)    H/O echocardiogram     LVE.  EF 45-50%.  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 Sequential SVG to the diagonal and obtuse marginal branch    OTHER SURGICAL HISTORY  2015    s/p resection neurofibroma RLE    OTHER SURGICAL HISTORY  2015    R kidney mass removal    TONSILLECTOMY       Social History     Socioeconomic History    Marital status: Married     Spouse name: Not on file    Number of children: Not on file    Years of education: Not on file    Highest education level: Not on file  Occupational History    Not on file   Tobacco Use    Smoking status: Never    Smokeless tobacco: Never   Vaping Use    Vaping Use: Never used   Substance and Sexual Activity    Alcohol use: Yes    Drug use: No    Sexual activity: Not Currently   Other Topics Concern    Not on file   Social History Narrative    Not on file     Social Determinants of Health     Financial Resource Strain: Low Risk  (10/26/2021)    Overall Financial Resource Strain (CARDIA)     Difficulty of Paying Living Expenses: Not hard at all   Food Insecurity: Not on file (10/26/2021)   Transportation Needs: Unknown (10/26/2021)    PRAPARE - Therapist, art (Medical): Not on file     Lack of Transportation (Non-Medical): No   Physical Activity: Insufficiently Active (11/11/2021)    Exercise Vital Sign     Days of Exercise per Week: 2 days     Minutes of Exercise per Session: 60 min   Stress: Not on file   Social Connections: Not on file   Intimate Partner Violence: Not At Risk (11/11/2021)    Humiliation, Afraid, Rape, and Kick questionnaire     Fear of Current or Ex-Partner: No     Emotionally Abused: No     Physically Abused: No     Sexually Abused: No   Housing Stability: Unknown  (10/26/2021)    Housing Stability Vital Sign     Unable to Pay for Housing in the Last Year: Not on file     Number of Places Lived in the Last Year: Not on file     Unstable Housing in the Last Year: No     Current Outpatient Medications   Medication Sig    simvastatin (ZOCOR) 40 MG tablet TAKE 1 TABLET BY MOUTH EVERY NIGHT    amLODIPine (NORVASC) 5 MG tablet TAKE 1 TABLET BY MOUTH DAILY    carvedilol (COREG) 12.5 MG tablet TAKE 1 TABLET BY MOUTH TWICE DAILY WITH MEALS    Icosapent Ethyl (VASCEPA) 1 g CAPS capsule TAKE 2 CAPSULES BY MOUTH TWICE DAILY WITH MEALS    losartan (COZAAR) 50 MG tablet Take 100 mg (2 tablets) by mouth in the am and 50 mg (1 tablet) by mouth in the PM.    Multiple Vitamin (MULTIVITAMIN PO) Take 1 tablet by mouth daily    aspirin 81 MG chewable tablet Take 1 tablet by mouth daily    triamcinolone (NASACORT) 55 MCG/ACT nasal inhaler INSTILL 2 SPRAYS INTO EACH NOSTRIL DAILY     No current facility-administered medications for this visit.     Allergies   Allergen Reactions    Ace Inhibitors Cough     Other reaction(s): Unknown (comments)  Intolerance b/c of dry coughing    Bee Venom Hives     REVIEW OF SYSTEMS: colo 4/21 Dr Hart Rochester    There were no vitals filed for this visit.  A&O x3  Affect is appropriate.  Mood stable  No apparent distress  Anicteric, no JVD, adenopathy or thyromegaly.   No carotid bruits or radiated murmur  Lungs clear to auscultation, no wheezes or rales  Heart showed regular rate and rhythm. No murmur, rubs, gallops  Abdomen soft nontender, no hepatosplenomegaly or masses.  Diastasis noted  Extremities with 1+ ankle edema.  Pulses 1-2+ symmetrically  Borderline spurling on right    LABS  From 2/16 showed gluc 101, cr 1.28, gfr 56,  alt 12,    chol 143, tg 65, hdl 61, ldl-c 69, wbc 5.0, hb 13.3, plt 175, tsh 2.94, ua neg  From 2/17 showed gluc 96,   cr 1.19, gfr>60, alt 29, hba1c 5.4, umar 5.0,  chol 170, tg 65, hdl 66, ldl-c 91, wbc 5.3, hb 13.6, plt 184, tsh 2.92, ua  neg  From 2/18 showed gluc 104, cr 1.33, gfr 53,  alt 13, hba1c 5.6, umar 6.0,  chol 121, tg 51, hdl 58, ldl-c 53, wbc 5.2, hb 13.6, plt 177, tsh 3.09  From 8/18 showed gluc 104, cr 1.26, gfr 56,  alt 33,    chol 135, tg 58, hdl 61, ldl-c 62, wbc 6.0, hb 13.6, plt 180  From 8/19 showed gluc 97,   cr 1.36, gfr 51,     chol 134, tg 82, hdl 51, ldl-c 67,                  psa 2.90  From 2/20 showed gluc 96,   cr 1.28, gfr 55,  alt 24,    chol 137, tg 66, hdl 57, ldl-c 67  From 7/20 showed gluc 93,   cr 1.67, gfr 40,  alt 27,     chol 151, tg 95, hdl 58, ldl-c 74  From 1/21 showed gluc 95,   cr 1.74, gfr 39,  alt 25, hba1c 5.5,   chol 134, tg 84, hdl 50, ldl-c 67, wbc 6.0, hb 12.9, plt 194,               psa 5.30  From 4/21 showed gluc 98,   cr 1.37, gfr 51  From 8/21 showed gluc 106, cr 1.28, gfr 55,  alt 21, hba1c 5.3   chol 125, tg 45, hdl 63, ldl-c 53  From 2/22 showed gluc 97,   cr 1.23, gfr 57,  hba1c 5.4,           wbc 5.6, hb 12.0, plt 175  From 3/22 showed                             fe 70, %sat 24, ferritin 70, b12 434, fol>20, spep neg  From 8/22 showed                   psa 4.42  From 9/22 showed gluc 94,   cr 1.46, gfr 47, alt 17, hba1c 5.3,   chol 133, tg 64, hdl 58, ldl-c 62, wbc 6.4, hb 12.9, plt 165  From 3/23 showed gluc 103, cr 1.29, gfr 57,             wbc 5.7, hb 13.1, plt 177  From 9/23 showed gluc 106, cr 1.33, gfr 55, alt 33, hba1c 5.2, chol 133, tg 59, hdl 65, ldl-c 56,     No results found for this or any previous visit (from the past 2160 hour(s)).        We reviewed the patient's labs from the last several visits to point out trends in the numbers    Patient Active Problem List   Diagnosis    CKD (chronic kidney disease) stage 3, GFR 30-59 ml/min (HCC)    Coronary atherosclerosis of native coronary artery    Elevated prostate specific antigen (PSA)    BPH with obstruction/lower urinary tract symptoms    Hyperlipidemia  Colon polyp    Primary hypertension    Impaired fasting glucose          Assessment and plan:  1.  CHD.  F/U Dr Dory Peru  2.  BPH/LUTS, elev psa.  F/u PA Aloia  3.  HLP.  Continue current regimen.  4.  HTN.  Continue current   5.  IFG.  Lifestyle and dietary measures. Further weight loss would be ideal  6.  CKD.  Stable   7.  Colon polyps.  Fiber, colonoscopy 2026  8.  Cervical radiculitis?  Pred 5 days and he will call w update      RTC 3/24    Instructions above were handwritten on the lab results sheet that I gave the patient today    Above conditions discussed at length and patient vocalized understanding.  All questions answered to patient satisfaction    {No diagnosis found. (Refresh or delete this SmartLink)}

## 2022-02-09 NOTE — Progress Notes (Signed)
Raymond [name] presents today for   Chief Complaint   Patient presents with    Hoarse     X 2 months       1. "Have you been to the ER, urgent care clinic since your last visit?  Hospitalized since your last visit?" No    2. "Have you seen or consulted any other health care providers outside of the [place] since your last visit?" No     3. For patients aged 78-75: Has the patient had a colonoscopy / FIT/ Cologuard? NA - based on age      If the patient is male:    4. For patients aged 7-74: Has the patient had a mammogram within the past 2 years? NA - based on age or sex      63. For patients aged 21-65: Has the patient had a pap smear? NA - based on age or sex

## 2022-03-20 ENCOUNTER — Encounter

## 2022-03-28 ENCOUNTER — Ambulatory Visit: Payer: BLUE CROSS/BLUE SHIELD | Primary: Internal Medicine

## 2022-03-28 DIAGNOSIS — J3801 Paralysis of vocal cords and larynx, unilateral: Secondary | ICD-10-CM

## 2022-03-28 LAB — POCT CREATININE AND GFR
POC Creatinine: 1.6 MG/DL — ABNORMAL HIGH (ref 0.6–1.3)
eGFR, POC: 44 mL/min/{1.73_m2} — ABNORMAL LOW (ref >60)

## 2022-03-28 MED ORDER — IOPAMIDOL 61 % IV SOLN
61 % | Freq: Once | INTRAVENOUS | Status: AC | PRN
Start: 2022-03-28 — End: 2022-03-28
  Administered 2022-03-28: 14:00:00 60 mL via INTRAVENOUS

## 2022-03-28 MED FILL — ISOVUE-300 61 % IV SOLN: 61 % | INTRAVENOUS | Qty: 60

## 2022-03-29 NOTE — Telephone Encounter (Signed)
From: Raymond [name]  To: Dr. Albin Felling  Sent: 03/29/2022 12:40 PM EST  Subject: Creatinine level    Dr. Ky Barban,  I recently had a CT scan at the request of [facility], Elta Guadeloupe and the tech who performed the scan said that I should inform you that my Creatinine level was 1.6 and the GFR was 44. She said it was above the expected levels. I know that my Creatinine level has been high before. I will be seeing you on March 20th with blood work where it should be tested again and you can advise me of any further actions if needed.  Thank you.  Raymond [name]

## 2022-04-06 NOTE — Telephone Encounter (Signed)
Last OV: 02/09/2022  Next OV: 04/26/2022  Last refill: 05/12/2021

## 2022-04-07 ENCOUNTER — Encounter: Admit: 2022-04-07 | Discharge: 2022-04-07 | Payer: PRIVATE HEALTH INSURANCE | Primary: Internal Medicine

## 2022-04-07 DIAGNOSIS — R972 Elevated prostate specific antigen [PSA]: Secondary | ICD-10-CM

## 2022-04-07 LAB — PROSTATE SPECIFIC ANTIGEN, TOTAL: PSA: 3.99 ng/mL (ref 0.00–4.00)

## 2022-04-07 MED ORDER — ICOSAPENT ETHYL 1 G PO CAPS
1 | ORAL_CAPSULE | ORAL | 11 refills | Status: AC
Start: 2022-04-07 — End: ?

## 2022-04-07 NOTE — Telephone Encounter (Signed)
-----   Message from Georgia Duff, [place] sent at 04/07/2022  3:04 PM EST -----  PSA has decreased, follow up in September for 1 year with PSA prior.

## 2022-04-07 NOTE — Progress Notes (Signed)
Raymond [name] presents today for lab draw per Pa Aloia order.   Dr. Caryn Section was present in the clinic as incident to.     PSA obtained via venipuncture without any difficulty.    Patient will be notified with lab results.       Orders Placed This Encounter   Procedures    Prostate Specific Antigen, Total    PR COLLECTION VENOUS BLOOD VENIPUNCTURE       Eda Keys, MA

## 2022-04-07 NOTE — Telephone Encounter (Signed)
I spoke with pt and he was aware of everything thanked me and call was ended.

## 2022-04-07 NOTE — Other (Signed)
PSA has decreased, follow up in September for 1 year with PSA prior.

## 2022-04-19 ENCOUNTER — Inpatient Hospital Stay: Admit: 2022-04-19 | Payer: BLUE CROSS/BLUE SHIELD | Primary: Internal Medicine

## 2022-04-19 ENCOUNTER — Ambulatory Visit: Admit: 2022-04-19 | Discharge: 2022-04-19 | Payer: BLUE CROSS/BLUE SHIELD | Primary: Internal Medicine

## 2022-04-19 DIAGNOSIS — Z0000 Encounter for general adult medical examination without abnormal findings: Secondary | ICD-10-CM

## 2022-04-19 LAB — CBC WITH AUTO DIFFERENTIAL
Absolute Immature Granulocyte: 0 10*3/uL (ref 0.00–0.04)
Basophils %: 1 % (ref 0–2)
Basophils Absolute: 0.1 10*3/uL (ref 0.0–0.1)
Eosinophils %: 4 % (ref 0–5)
Eosinophils Absolute: 0.2 10*3/uL (ref 0.0–0.4)
Hematocrit: 39.6 % (ref 36.0–48.0)
Hemoglobin: 13.2 g/dL (ref 13.0–16.0)
Immature Granulocytes: 0 % (ref 0.0–0.5)
Lymphocytes %: 24 % (ref 21–52)
Lymphocytes Absolute: 1.1 10*3/uL (ref 0.9–3.6)
MCH: 30.8 PG (ref 24.0–34.0)
MCHC: 33.3 g/dL (ref 31.0–37.0)
MCV: 92.3 FL (ref 78.0–100.0)
MPV: 10.4 FL (ref 9.2–11.8)
Monocytes %: 9 % (ref 3–10)
Monocytes Absolute: 0.5 10*3/uL (ref 0.05–1.2)
Neutrophils %: 62 % (ref 40–73)
Neutrophils Absolute: 3 10*3/uL (ref 1.8–8.0)
Nucleated RBCs: 0 PER 100 WBC
Platelets: 161 10*3/uL (ref 135–420)
RBC: 4.29 M/uL — ABNORMAL LOW (ref 4.35–5.65)
RDW: 13.5 % (ref 11.6–14.5)
WBC: 4.8 10*3/uL (ref 4.6–13.2)
nRBC: 0 10*3/uL (ref 0.00–0.01)

## 2022-04-19 LAB — BASIC METABOLIC PANEL
Anion Gap: 4 mmol/L (ref 3.0–18)
BUN: 21 MG/DL — ABNORMAL HIGH (ref 7.0–18)
Bun/Cre Ratio: 16 (ref 12–20)
CO2: 28 mmol/L (ref 21–32)
Calcium: 9 MG/DL (ref 8.5–10.1)
Chloride: 111 mmol/L (ref 100–111)
Creatinine: 1.28 MG/DL (ref 0.6–1.3)
Est, Glom Filt Rate: 58 mL/min/{1.73_m2} — ABNORMAL LOW (ref >60)
Glucose: 105 mg/dL — ABNORMAL HIGH (ref 74–99)
Potassium: 3.7 mmol/L (ref 3.5–5.5)
Sodium: 143 mmol/L (ref 136–145)

## 2022-04-19 LAB — HEMOGLOBIN A1C W/O EAG: Hemoglobin A1C: 5.4 % (ref 4.2–5.6)

## 2022-04-24 MED ORDER — LOSARTAN POTASSIUM 50 MG PO TABS
50 | ORAL_TABLET | ORAL | 3 refills | Status: AC
Start: 2022-04-24 — End: ?

## 2022-04-26 ENCOUNTER — Ambulatory Visit
Admit: 2022-04-26 | Discharge: 2022-04-26 | Payer: BLUE CROSS/BLUE SHIELD | Attending: Internal Medicine | Primary: Internal Medicine

## 2022-04-26 DIAGNOSIS — I2510 Atherosclerotic heart disease of native coronary artery without angina pectoris: Secondary | ICD-10-CM

## 2022-04-26 NOTE — Progress Notes (Unsigned)
77 y.o. male who presents for RPE    No cardiovascular complaint, active with yardwork, bowls once weekly, no other set exercise.  Bp is running in the 130s when he checks, Dr Dory Peru had inc the cozaar to 100am/50pm at their encounter in March.  He has pedal edema on amlo 5 but dealing with it    He tweaked his neck/shoulder while bowling last week, took some tylenol and sx improved although he still has vague paresthesia in the right neck.  No weakness, numbness, sx down the arm    No gi issues currently.  Sees NP Aloia for the elevated psa.     Denies polyuria, polydipsia, nocturia, vision change.  Not checking sugars at this time. He's keeping the weight stable    Vitals 04/20/2021 04/20/2021 10/27/2020 10/20/2020 10/08/2020 05/03/2020   Weight 190 lb 190 lb  189 lb 192 lb 192 lb     *LAST MEDICARE WELLNESS EXAM: 09/10/15, 04/07/20, not medicare b    Past Medical History:   Diagnosis Date    Allergic rhinitis     BPH with obstruction/lower urinary tract symptoms     CAD (coronary artery disease)     IMI w stent of RCA 9/03; s/p CABG X3 1/16    Cervical disc disease 2023    on xray [facility]    CKD (chronic kidney disease)     pre2016    Colon polyp 05/09/2019    Dr Hart Rochester    COVID-19 vaccine dose declined 04/2021    boosters    Diastasis recti     Dyslipidemia     Elevated prostate specific antigen (PSA)     [facility]    H/O cardiac catheterization 02/29/2004    oRCA 100%.  LM patent.  p/mLAD 85%.  D1 90%.  D2 90%.  CX 45%.  LVEDP 12 mmHg.  EF 45%.  CABG recommended.    H/O cardiovascular stress test     Intermediate risk.  Mild-mod basal inf infarct w/mild-mod peri-infarct isch  mid inf, prox & mid inf/lat walls.  EF unreadable (3/15); ef 56%, tid 0.96, inf/lat fixed defect, neg isch (3/22)    H/O echocardiogram     LVE.  EF 45-50%.  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 Sequential SVG to the diagonal and obtuse marginal branch    OTHER SURGICAL HISTORY  2015    s/p resection neurofibroma RLE    OTHER SURGICAL HISTORY  2015    R kidney mass removal    TONSILLECTOMY       Social History     Socioeconomic History    Marital status: Married     Spouse name: Not on file    Number of children: Not on file  Years of education: Not on file    Highest education level: Not on file   Occupational History    Not on file   Tobacco Use    Smoking status: Never     Passive exposure: Never    Smokeless tobacco: Never   Vaping Use    Vaping Use: Never used   Substance and Sexual Activity    Alcohol use: Yes    Drug use: No    Sexual activity: Not Currently   Other Topics Concern    Not on file   Social History Narrative    Not on file     Social Determinants of Health     Financial Resource Strain: Low Risk  (10/26/2021)    Overall Financial Resource Strain (CARDIA)     Difficulty of Paying Living Expenses: Not hard at all   Food Insecurity: Not on file (10/26/2021)   Transportation Needs: Unknown (10/26/2021)    PRAPARE - Therapist, art (Medical): Not on file     Lack of Transportation (Non-Medical): No   Physical Activity: Insufficiently Active (11/11/2021)    Exercise Vital Sign     Days of Exercise per Week: 2 days     Minutes of Exercise per Session: 60 min   Stress: Not on file   Social Connections: Not on file   Intimate Partner Violence: Not At Risk (11/11/2021)    Humiliation, Afraid, Rape, and Kick questionnaire     Fear of Current or Ex-Partner: No      Emotionally Abused: No     Physically Abused: No     Sexually Abused: No   Housing Stability: Unknown (10/26/2021)    Housing Stability Vital Sign     Unable to Pay for Housing in the Last Year: Not on file     Number of Places Lived in the Last Year: Not on file     Unstable Housing in the Last Year: No     Current Outpatient Medications   Medication Sig    Icosapent Ethyl (VASCEPA) 1 g CAPS capsule TAKE 2 CAPSULES BY MOUTH TWICE DAILY WITH MEALS    omeprazole (PRILOSEC) 40 MG delayed release capsule Take 1 capsule by mouth every morning (before breakfast)    simvastatin (ZOCOR) 40 MG tablet TAKE 1 TABLET BY MOUTH EVERY NIGHT    amLODIPine (NORVASC) 5 MG tablet TAKE 1 TABLET BY MOUTH DAILY    carvedilol (COREG) 12.5 MG tablet TAKE 1 TABLET BY MOUTH TWICE DAILY WITH MEALS    losartan (COZAAR) 50 MG tablet Take 100 mg (2 tablets) by mouth in the am and 50 mg (1 tablet) by mouth in the PM.    Multiple Vitamin (MULTIVITAMIN PO) Take 1 tablet by mouth daily    aspirin 81 MG chewable tablet Take 1 tablet by mouth daily    triamcinolone (NASACORT) 55 MCG/ACT nasal inhaler INSTILL 2 SPRAYS INTO EACH NOSTRIL DAILY     No current facility-administered medications for this visit.     Allergies   Allergen Reactions    Ace Inhibitors Cough     Other reaction(s): Unknown (comments)  Intolerance b/c of dry coughing    Bee Venom Hives     REVIEW OF SYSTEMS: colo 4/21 Dr Hart Rochester    There were no vitals filed for this visit.  A&O x3  Affect is appropriate.  Mood stable  No apparent distress  Anicteric, no JVD, adenopathy or thyromegaly.   No carotid bruits or  radiated murmur  Lungs clear to auscultation, no wheezes or rales  Heart showed regular rate and rhythm. No murmur, rubs, gallops  Abdomen soft nontender, no hepatosplenomegaly or masses.  Diastasis noted  Extremities with 1+ ankle edema.  Pulses 1-2+ symmetrically  Borderline spurling on right    LABS  From 2/16 showed gluc 101, cr 1.28, gfr 56,  alt 12,    chol 143, tg 65, hdl  61, ldl-c 69, wbc 5.0, hb 13.3, plt 175, tsh 2.94, ua neg  From 2/17 showed gluc 96,   cr 1.19, gfr>60, alt 29, hba1c 5.4, umar 5.0,  chol 170, tg 65, hdl 66, ldl-c 91, wbc 5.3, hb 13.6, plt 184, tsh 2.92, ua neg  From 2/18 showed gluc 104, cr 1.33, gfr 53,  alt 13, hba1c 5.6, umar 6.0,  chol 121, tg 51, hdl 58, ldl-c 53, wbc 5.2, hb 13.6, plt 177, tsh 3.09  From 8/18 showed gluc 104, cr 1.26, gfr 56,  alt 33,    chol 135, tg 58, hdl 61, ldl-c 62, wbc 6.0, hb 13.6, plt 180  From 8/19 showed gluc 97,   cr 1.36, gfr 51,     chol 134, tg 82, hdl 51, ldl-c 67,                 psa 2.90  From 2/20 showed gluc 96,   cr 1.28, gfr 55,  alt 24,    chol 137, tg 66, hdl 57, ldl-c 67  From 7/20 showed gluc 93,   cr 1.67, gfr 40,  alt 27,     chol 151, tg 95, hdl 58, ldl-c 74  From 1/21 showed gluc 95,   cr 1.74, gfr 39,  alt 25, hba1c 5.5,   chol 134, tg 84, hdl 50, ldl-c 67, wbc 6.0, hb 12.9, plt 194,              psa 5.30  From 4/21 showed gluc 98,   cr 1.37, gfr 51  From 8/21 showed gluc 106, cr 1.28, gfr 55,  alt 21, hba1c 5.3   chol 125, tg 45, hdl 63, ldl-c 53  From 2/22 showed gluc 97,   cr 1.23, gfr 57,  hba1c 5.4,           wbc 5.6, hb 12.0, plt 175  From 3/22 showed                            fe 70, %sat 24, ferritin 70, b12 434, fol>20, spep neg  From 8/22 showed                  psa 4.42  From 9/22 showed gluc 94,   cr 1.46, gfr 47, alt 17, hba1c 5.3,   chol 133, tg 64, hdl 58, ldl-c 62, wbc 6.4, hb 12.9, plt 165  From 3/23 showed gluc 103, cr 1.29, gfr 57,             wbc 5.7, hb 13.1, plt 177  From 9/23 showed gluc 106, cr 1.33, gfr 55, alt 33, hba1c 5.2,   chol 133, tg 59, hdl 65, ldl-c 56,             ua neg    Results for orders placed or performed during the hospital encounter of 04/19/22 (from the past 2160 hour(s))   Basic Metabolic Panel   Result Value Ref Range    Sodium 143 136 - 145  mmol/L    Potassium 3.7 3.5 - 5.5 mmol/L    Chloride 111 100 - 111 mmol/L    CO2 28 21 - 32 mmol/L    Anion Gap 4 3.0 - 18  mmol/L    Glucose 105 (H) 74 - 99 mg/dL    BUN 21 (H) 7.0 - 18 MG/DL    Creatinine 8.11 0.6 - 1.3 MG/DL    Bun/Cre Ratio 16 12 - 20      Est, Glom Filt Rate 58 (L) >60 ml/min/1.40m2    Calcium 9.0 8.5 - 10.1 MG/DL   HEMOGLOBIN B1Y W/O EAG   Result Value Ref Range    Hemoglobin A1C 5.4 4.2 - 5.6 %   CBC with Auto Differential   Result Value Ref Range    WBC 4.8 4.6 - 13.2 K/uL    RBC 4.29 (L) 4.35 - 5.65 M/uL    Hemoglobin 13.2 13.0 - 16.0 g/dL    Hematocrit 78.2 95.6 - 48.0 %    MCV 92.3 78.0 - 100.0 FL    MCH 30.8 24.0 - 34.0 PG    MCHC 33.3 31.0 - 37.0 g/dL    RDW 21.3 08.6 - 57.8 %    Platelets 161 135 - 420 K/uL    MPV 10.4 9.2 - 11.8 FL    Nucleated RBCs 0.0 0 PER 100 WBC    nRBC 0.00 0.00 - 0.01 K/uL    Neutrophils % 62 40 - 73 %    Lymphocytes % 24 21 - 52 %    Monocytes % 9 3 - 10 %    Eosinophils % 4 0 - 5 %    Basophils % 1 0 - 2 %    Immature Granulocytes 0 0.0 - 0.5 %    Neutrophils Absolute 3.0 1.8 - 8.0 K/UL    Lymphocytes Absolute 1.1 0.9 - 3.6 K/UL    Monocytes Absolute 0.5 0.05 - 1.2 K/UL    Eosinophils Absolute 0.2 0.0 - 0.4 K/UL    Basophils Absolute 0.1 0.0 - 0.1 K/UL    Absolute Immature Granulocyte 0.0 0.00 - 0.04 K/UL    Differential Type AUTOMATED     Results for orders placed or performed in visit on 04/07/22 (from the past 2160 hour(s))   Prostate Specific Antigen, Total   Result Value Ref Range    PSA 3.99 0.00 - 4.00 ng/mL   Results for orders placed or performed during the hospital encounter of 03/28/22 (from the past 2160 hour(s))   CT SOFT TISSUE NECK W CONTRAST    Impression    1.  Questionable slight medialization of the left true vocal cord which could  suggest a left-sided paralysis. 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 If not already performed elsewhere, then recommend  ultrasound characterization of the thyroid. No other explanation for the  paralysis.  2.  No suspicious  cervical lymphadenopathy.  3.  Right maxillary sinus mucosal disease with subtotal opacification which is  probably chronic.   POCT Creatinine and GFR   Result Value Ref Range    POC Creatinine 1.6 (H) 0.6 - 1.3 MG/DL    eGFR, POC 44 (L) >46 ml/min/1.22m2         We reviewed the patient's labs from the last several visits to point out trends in the numbers    Patient Active Problem List  Diagnosis    CKD (chronic kidney disease) stage 3, GFR 30-59 ml/min (HCC)    Coronary atherosclerosis of native coronary artery    Elevated prostate specific antigen (PSA)    BPH with obstruction/lower urinary tract symptoms    Hyperlipidemia    Colon polyp    Primary hypertension    Impaired fasting glucose         Assessment and plan:  1.  CHD.  F/U Dr Dory Peru  2.  BPH/LUTS, elev psa.  F/u PA Aloia  3.  HLP.  Continue current regimen.  4.  HTN.  Continue current   5.  IFG.  Lifestyle and dietary measures. Further weight loss would be ideal  6.  CKD.  Stable   7.  Colon polyps.  Fiber, colonoscopy 2026  8.  Cervical radiculitis?  Pred 5 days and he will call w update      RTC 3/24    Instructions above were handwritten on the lab results sheet that I gave the patient today    Above conditions discussed at length and patient vocalized understanding.  All questions answered to patient satisfaction    {No diagnosis found. (Refresh or delete this SmartLink)}

## 2022-05-03 ENCOUNTER — Ambulatory Visit
Admit: 2022-05-03 | Discharge: 2022-05-03 | Payer: BLUE CROSS/BLUE SHIELD | Attending: Interventional Cardiology | Primary: Internal Medicine

## 2022-05-03 DIAGNOSIS — I2510 Atherosclerotic heart disease of native coronary artery without angina pectoris: Secondary | ICD-10-CM

## 2022-05-03 NOTE — Progress Notes (Signed)
Etta Quill presents today for   Chief Complaint   Patient presents with    Follow-up     6 months       Etta Quill preferred language for health care discussion is english/other.    Is someone accompanying this pt? no    Is the patient using any DME equipment during OV? no    Depression Screening:  Depression: Not at risk (05/03/2022)    PHQ-2     PHQ-2 Score: 0        Learning Assessment:  Who is the primary learner? Patient    What is the preferred language for health care of the primary learner? ENGLISH    How does the primary learner prefer to learn new concepts? DEMONSTRATION    Answered By patient    Relationship to Learner SELF           Pt currently taking Anticoagulant therapy? no    Pt currently taking Antiplatelet therapy ? Aspirin 81 mg daily      Coordination of Care:  1. Have you been to the ER, urgent care clinic since your last visit? Hospitalized since your last visit? no    2. Have you seen or consulted any other health care providers outside of the [place] since your last visit? Include any pap smears or colon screening. no

## 2022-05-03 NOTE — Progress Notes (Signed)
HISTORY OF PRESENT ILLNESS  Raymond [name]  77 y.o. male     Chief Complaint   Patient presents with    Follow-up     6 months       ASSESSMENT and PLAN    The primary encounter diagnosis was Atherosclerosis of native coronary artery without angina pectoris, unspecified whether native or transplanted heart. Diagnoses of Ventricular tachycardia ([place]) and Shortness of breath were also pertinent to this visit.    Raymond [name], previous Dr. Peggyann Juba patient, has history of CAD.  Back in January 2006, he presented with sinking feeling and nonsustained VT.  Ultimately, he underwent CABG with LIMA to LAD as well as sequential SVG to diagonal and OM branch.  Since that time, he has had nuclear scan in 2012 which showed EF of 55% with fixed inferior defect.  His nuclear scan in February 2018 showed inferior fixed defect without ischemia.  His EF was noted to be 52%.  Because of bradycardia, he did have Holter monitor in February 2018 which showed asymptomatic sinus bradycardia.  His minimum heart rate was 41 bpm.  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  He does have history hypertension, and hyperlipidemia.  He denies active tobacco use.  He has chronic renal disease with his creatinine in July 2020 to be about 1.65 from baseline of 1.36.  Diuretic regimen was decreased at that time.  In March 2022, he had nuclear scan performed which revealed EF 52% with medium size inferior/inferolateral fixed defect.  This was unchanged from 2012 study.    CAD:    Clinically stable.  BP:    Well-controlled at 136/70.  Rhythm:    Asymptomatic sinus bradycardia 56 bpm.  CHF:    There is no evidence of decompensated CHF noted.  Weight:     His weight today is 189 pounds.  His baseline weight is 190 pounds.  Cholesterol:   Target LDL <70.  Zocor 40 mg daily.  Anti-platelet:   Remains on ASA.      His voice changes have been evaluated.  They have  noted a mass behind his thyroid.  With his known history of CAD, CABG, and possible need for head and neck/throat diagnostic procedures, I would like to proceed on with repeat echocardiogram and nuclear scan.    If the above testing is unremarkable, I will see him back in 6 months.  Thank you.    Orders Placed This Encounter   Procedures    EKG 12 Lead     Order Specific Question:   Reason for Exam?     Answer:   Other    Nuclear stress test with myocardial perfusion     Standing Status:   Future     Standing Expiration Date:   05/03/2023     Order Specific Question:   What stress agent should be used?     Answer:   Carlton Adam     Order Specific Question:   [facility] [employer]     Answer:   Per Facility Protocol        HPI  Today, [name] has no complaints of chest pains.  He has baseline DOE without change.  His biggest issue appears to be raspy voice.  This has undergone evaluation and a mass behind his thyroid has been found.  He is currently undergoing evaluation for possible tumor workup.  He  denies any orthopnea or PND.  He denies any palpitation sensation or dizziness.    Review of Systems  14 point review of system is negative unless mentioned in HPI    Physical Exam  Vitals and nursing note reviewed.   Constitutional:       Appearance: Normal appearance.   HENT:      Head: Normocephalic.   Eyes:      Conjunctiva/sclera: Conjunctivae normal.   Neck:      Vascular: No carotid bruit.   Cardiovascular:      Rate and Rhythm: Regular rhythm. Bradycardia present.   Pulmonary:      Breath sounds: Normal breath sounds.   Abdominal:      Palpations: Abdomen is soft.   Musculoskeletal:         General: No swelling.      Cervical back: No rigidity.   Skin:     General: Skin is warm and dry.   Neurological:      General: No focal deficit present.      Mental Status: He is alert and oriented to person, place, and time.   Psychiatric:         Mood and Affect: Mood normal.         Behavior: Behavior normal.             PCP: Albin Felling, MD    Past Medical History:   Diagnosis Date    Allergic rhinitis     BPH with obstruction/lower urinary tract symptoms     CAD (coronary artery disease)     IMI w stent of RCA 9/03; s/p CABG X3 1/16    Cervical disc disease 2023    on xray [facility]    CKD (chronic kidney disease)     pre2016    Colon polyp 05/09/2019    Dr Kellie Simmering    COVID-19 vaccine dose declined 04/2021    boosters    Diastasis recti     Dyslipidemia     Elevated prostate specific antigen (PSA)     [facility]    H/O cardiac catheterization 02/29/2004    oRCA 100%.  LM patent.  p/mLAD 85%.  D1 90%.  D2 90%.  CX 45%.  LVEDP 12 mmHg.  EF 45%.  CABG recommended.    H/O cardiovascular stress test     Intermediate risk.  Mild-mod basal inf infarct w/mild-mod peri-infarct isch  mid inf, prox & mid inf/lat walls.  EF unreadable (3/15); ef 56%, tid 0.96, inf/lat fixed defect, neg isch (3/22)    H/O echocardiogram     LVE.  EF 45-50%.  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 Sequential SVG to the diagonal and obtuse marginal branch    OTHER SURGICAL HISTORY  2015    s/p resection neurofibroma RLE    OTHER SURGICAL HISTORY  2015    R kidney mass removal    TONSILLECTOMY         Current Outpatient Medications   Medication Sig Dispense Refill    losartan (COZAAR) 50 MG tablet TAKE 2 TABLETS BY MOUTH IN THE  MORNING AND 1 TABLET IN THE EVENING 270 tablet 3    Icosapent Ethyl (VASCEPA) 1 g CAPS capsule TAKE 2 CAPSULES BY MOUTH TWICE DAILY WITH MEALS 120 capsule 11    omeprazole (PRILOSEC) 40 MG delayed release capsule Take 1 capsule by mouth every morning (before breakfast) (Patient taking differently: Take 20 mg by mouth in the morning and at bedtime) 90 capsule 1    simvastatin (ZOCOR) 40 MG tablet TAKE 1 TABLET BY MOUTH EVERY NIGHT 90 tablet 3    amLODIPine (NORVASC) 5 MG tablet TAKE 1 TABLET BY MOUTH DAILY 90 tablet 3    carvedilol (COREG) 12.5 MG tablet TAKE 1 TABLET BY MOUTH TWICE DAILY WITH MEALS 180 tablet 3    Multiple Vitamin (MULTIVITAMIN PO) Take 1 tablet by mouth daily      aspirin 81 MG chewable tablet Take 1 tablet by mouth daily      triamcinolone (NASACORT) 55 MCG/ACT nasal inhaler INSTILL 2 SPRAYS INTO EACH NOSTRIL DAILY       No current facility-administered medications for this visit.       The patient has a family history of    Social History     Tobacco Use    Smoking status: Never     Passive exposure: Never    Smokeless tobacco: Never   Vaping Use    Vaping Use: Never used   Substance Use Topics    Alcohol use: Yes     Alcohol/week: 2.0 standard drinks of alcohol     Types: 2 Glasses of wine per week    Drug use: No       Lab Results   Component Value Date/Time    CHOL 133 10/19/2021 08:20 AM    HDL 65 10/19/2021 08:20 AM        BP Readings from Last 3 Encounters:   05/03/22 136/70   04/26/22 (!) 151/75   02/09/22 136/72        Pulse Readings from Last 3 Encounters:   05/03/22 56   04/26/22 78   02/09/22 51       Wt Readings from Last 3 Encounters:   05/03/22 85.7 kg (189 lb)   04/26/22 89.6 kg (197 lb 9.6 oz)   02/09/22 86.6 kg (191 lb)         EKG: sinus bradycardia, RBBB, 1st degree AV block, unchanged from previous tracings.     Lowell Guitar, MD   05/03/2022

## 2022-05-16 NOTE — Telephone Encounter (Signed)
Pts wife called in states unsatisfied with ENT states wants to know if there is an ENT within [place] that will biopsy Pts throat nodules also wants to know if they can be referred to a thyroid specialist.   Pts wife wants to know if provider can call her back personally.     Please advise.

## 2022-05-16 NOTE — Telephone Encounter (Signed)
No ENT doctor in [facility]    He was seeing [facility] - [facility] Orma Render  They have a surgeon in that group - was he referred to that surgeon?    If they want, we can send to dr Daphine Deutscher or dr pasquale's gropu

## 2022-05-18 NOTE — Telephone Encounter (Signed)
I spoke with the patient's wife and have faxed the referral to Dr. Forestine Chute.

## 2022-06-10 ENCOUNTER — Encounter

## 2022-06-15 ENCOUNTER — Ambulatory Visit: Payer: BLUE CROSS/BLUE SHIELD | Primary: Internal Medicine

## 2022-06-15 ENCOUNTER — Ambulatory Visit: Admit: 2022-06-15 | Discharge: 2022-06-21 | Payer: BLUE CROSS/BLUE SHIELD | Primary: Internal Medicine

## 2022-06-15 DIAGNOSIS — I2510 Atherosclerotic heart disease of native coronary artery without angina pectoris: Secondary | ICD-10-CM

## 2022-06-15 LAB — ECHO (TTE) COMPLETE (PRN CONTRAST/BUBBLE/STRAIN/3D)
AV Area by Peak Velocity: 2.8 cm2
AV Area by VTI: 3.1 cm2
AV Mean Gradient: 4 mmHg
AV Mean Velocity: 0.9 m/s
AV Peak Gradient: 6 mmHg
AV Peak Velocity: 1.2 m/s
AV VTI: 32.1 cm
AV Velocity Ratio: 0.75
AVA/BSA Peak Velocity: 1.4 cm2/m2
AVA/BSA VTI: 1.5 cm2/m2
Ao Root Index: 1.88 cm/m2
Aortic Arch: 3.3 cm
Aortic Root: 3.8 cm
Ascending Aorta Index: 2.03 cm/m2
Ascending Aorta: 4.1 cm
Body Surface Area: 2.04 m2
E/E' Lateral: 10.29
E/E' Ratio (Averaged): 11.14
E/E' Septal: 12
EF BP: 47 % — AB (ref 55–100)
Est. RA Pressure: 3 mmHg
Fractional Shortening 2D: 17 % (ref 28–44)
IVSd: 1.4 cm — AB (ref 0.6–1.0)
LA Volume A-L A4C: 60 mL — AB (ref 18–58)
LA Volume A-L A4C: 64 mL — AB (ref 18–58)
LA Volume A/L: 62 mL
LA Volume BP: 60 mL — AB (ref 18–58)
LA Volume Index A-L A2C: 32 mL/m2 (ref 16–34)
LA Volume Index A-L A4C: 30 mL/m2 (ref 16–34)
LA Volume Index A/L: 31 mL/m2 (ref 16–34)
LA Volume Index BP: 30 ml/m2 (ref 16–34)
LA Volume Index MOD A2C: 31 ml/m2 (ref 16–34)
LA Volume Index MOD A4C: 29 ml/m2 (ref 16–34)
LA Volume MOD A2C: 63 mL — AB (ref 18–58)
LA Volume MOD A4C: 58 mL (ref 18–58)
LV E' Lateral Velocity: 7 cm/s
LV E' Septal Velocity: 6 cm/s
LV EDV A2C: 123 mL
LV EDV A4C: 132 mL
LV EDV BP: 128 mL (ref 67–155)
LV EDV Index A2C: 61 mL/m2
LV EDV Index A4C: 65 mL/m2
LV EDV Index BP: 63 mL/m2
LV ESV A2C: 59 mL
LV ESV A4C: 73 mL
LV ESV BP: 68 mL — AB (ref 22–58)
LV ESV Index A2C: 29 mL/m2
LV ESV Index A4C: 36 mL/m2
LV ESV Index BP: 34 mL/m2
LV Ejection Fraction A2C: 52 %
LV Ejection Fraction A4C: 44 %
LV Mass 2D Index: 137.8 g/m2 — AB (ref 49–115)
LV Mass 2D: 278.4 g — AB (ref 88–224)
LV RWT Ratio: 0.46
LVIDd Index: 2.57 cm/m2
LVIDd: 5.2 cm (ref 4.2–5.9)
LVIDs Index: 2.13 cm/m2
LVIDs: 4.3 cm
LVOT Area: 3.8 cm2
LVOT Diameter: 2.2 cm
LVOT Mean Gradient: 2 mmHg
LVOT Peak Gradient: 3 mmHg
LVOT Peak Velocity: 0.9 m/s
LVOT SV: 99.5 ml
LVOT Stroke Volume Index: 49.3 mL/m2
LVOT VTI: 26.2 cm
LVOT:AV VTI Index: 0.82
LVPWd: 1.2 cm — AB (ref 0.6–1.0)
MV A Velocity: 0.71 m/s
MV E Velocity: 0.72 m/s
MV E Wave Deceleration Time: 452.3 ms
MV E/A: 1.01
PV Max Velocity: 1 m/s
PV Peak Gradient: 4 mmHg
RA Area 4C: 22.8 cm2
RV Basal Dimension: 4.5 cm
RV Free Wall Peak S': 9 cm/s
RV Longitudinal Dimension: 7.5 cm
RV Mid Dimension: 3.7 cm
TAPSE: 2 cm (ref 1.7–?)

## 2022-06-16 ENCOUNTER — Inpatient Hospital Stay: Admit: 2022-06-16 | Payer: BLUE CROSS/BLUE SHIELD | Primary: Internal Medicine

## 2022-06-16 DIAGNOSIS — J3801 Paralysis of vocal cords and larynx, unilateral: Secondary | ICD-10-CM

## 2022-06-16 LAB — POCT CREATININE AND GFR
POC Creatinine: 1.3 MG/DL (ref 0.6–1.3)
eGFR, POC: 57 mL/min/{1.73_m2} — ABNORMAL LOW (ref >60)

## 2022-06-16 MED ORDER — IOPAMIDOL 61 % IV SOLN
61 | Freq: Once | INTRAVENOUS | Status: AC | PRN
Start: 2022-06-16 — End: 2022-06-16
  Administered 2022-06-16: 14:00:00 80 mL via INTRAVENOUS

## 2022-06-16 MED FILL — ISOVUE-300 61 % IV SOLN: 61 % | INTRAVENOUS | Qty: 100

## 2022-06-19 ENCOUNTER — Inpatient Hospital Stay: Admit: 2022-06-19 | Payer: BLUE CROSS/BLUE SHIELD | Attending: Interventional Cardiology | Primary: Internal Medicine

## 2022-06-19 DIAGNOSIS — I2510 Atherosclerotic heart disease of native coronary artery without angina pectoris: Secondary | ICD-10-CM

## 2022-06-19 LAB — NM STRESS TEST WITH MYOCARDIAL PERFUSION
Baseline Diastolic BP: 75 mmHg
Baseline HR: 44 {beats}/min
Baseline Systolic BP: 174 mmHg
Body Surface Area: 2.02 m2
Exercise Duration Seconds: 0 s
Exercise Duration Time: 4 min
Nuc Stress EF: 48 %
Stress Diastolic BP: 68 mmHg
Stress Estimated Workload: 1 METS
Stress Peak HR: 58 {beats}/min
Stress Percent HR Achieved: 41 %
Stress Rate Pressure Product: 10846 BPM*mmHg
Stress ST Depression: 0 mm
Stress Systolic BP: 187 mmHg
Stress Target HR: 143 {beats}/min
TID: 0.88

## 2022-06-19 MED ORDER — SODIUM CHLORIDE 0.9 % IV BOLUS
0.9 | Freq: Once | INTRAVENOUS | Status: AC
Start: 2022-06-19 — End: 2022-06-19
  Administered 2022-06-19: 14:00:00 250 mL via INTRAVENOUS

## 2022-06-19 MED ORDER — TECHNETIUM TC 99M TETROFOSMIN IV KIT
Freq: Once | INTRAVENOUS | Status: AC | PRN
Start: 2022-06-19 — End: 2022-06-19
  Administered 2022-06-19: 14:00:00 33 via INTRAVENOUS

## 2022-06-19 MED ORDER — TECHNETIUM TC 99M TETROFOSMIN IV KIT
Freq: Once | INTRAVENOUS | Status: AC | PRN
Start: 2022-06-19 — End: 2022-06-19
  Administered 2022-06-19: 12:00:00 10.2 via INTRAVENOUS

## 2022-06-19 MED ORDER — REGADENOSON 0.4 MG/5ML IV SOLN
0.4 | Freq: Once | INTRAVENOUS | Status: AC | PRN
Start: 2022-06-19 — End: 2022-06-19
  Administered 2022-06-19: 14:00:00 0.4 mg via INTRAVENOUS

## 2022-06-19 MED FILL — REGADENOSON 0.4 MG/5ML IV SOLN: 0.4 MG/5ML | INTRAVENOUS | Qty: 5

## 2022-06-19 MED FILL — SODIUM CHLORIDE 0.9 % IV SOLN: 0.9 % | INTRAVENOUS | Qty: 250

## 2022-07-10 NOTE — Telephone Encounter (Signed)
-----   Message from Brittney George, MA sent at 07/10/2022 11:13 AM EDT -----    ----- Message -----  From: Chung, Jun K, MD  Sent: 06/22/2022   9:58 AM EDT  To: Brittney George, MA    His echocardiogram is without changes.  It looks good.  If the nuclear scan is without significant reversible defect, he should be low risk for upcoming possible head and neck surgery.  ----- Message -----  From: George, Brittney, MA  Sent: 06/19/2022  10:26 AM EDT  To: Jun K Chung, MD    Per your last note "  His voice changes have been evaluated.  They have noted a mass behind his thyroid.  With his known history of CAD, CABG, and possible need for head and neck/throat diagnostic procedures, I would like to proceed on with repeat echocardiogram and nuclear scan.    If the above testing is unremarkable, I will see him back in 6 months.  Thank you.

## 2022-07-10 NOTE — Telephone Encounter (Signed)
-----   Message from [place],  sent at 07/10/2022 11:13 AM EDT -----    ----- Message -----  From: Synetta Shadow, MD  Sent: 06/22/2022   9:58 AM EDT  To: Aura Camps, MA    His echocardiogram is without changes.  It looks good.  If the nuclear scan is without significant reversible defect, he should be low risk for upcoming possible head and neck surgery.  ----- Message -----  From: Aura Camps, MA  Sent: 06/19/2022  10:26 AM EDT  To: Synetta Shadow, MD    Per your last note "  His voice changes have been evaluated.  They have noted a mass behind his thyroid.  With his known history of CAD, CABG, and possible need for head and neck/throat diagnostic procedures, I would like to proceed on with repeat echocardiogram and nuclear scan.    If the above testing is unremarkable, I will see him back in 6 months.  Thank you.

## 2022-07-10 NOTE — Telephone Encounter (Signed)
-----   Message from [place] sent at 07/10/2022 11:13 AM EDT -----    ----- Message -----  From: Synetta Shadow, MD  Sent: 06/22/2022  10:05 AM EDT  To: Aura Camps, MA    His nuclear scan shows EF 48% without reversible defect.  He should be low risk for his thyroid procedure if needed.  ----- Message -----  From: Aura Camps, MA  Sent: 06/21/2022   2:48 PM EDT  To: Synetta Shadow, MD    Per your last note "  His voice changes have been evaluated.  They have noted a mass behind his thyroid.  With his known history of CAD, CABG, and possible need for head and neck/throat diagnostic procedures, I would like to proceed on with repeat echocardiogram and nuclear scan.    If the above testing is unremarkable, I will see him back in 6 months.  Thank you.

## 2022-07-10 NOTE — Telephone Encounter (Signed)
Verbal order and read back per Synetta Shadow, MD:    His nuclear scan shows EF 48% without reversible defect.  He should be low risk for his thyroid procedure if needed.     I left the patient a message to call me back for results.

## 2022-07-17 NOTE — Telephone Encounter (Signed)
I called and left another voicemail for the patient to call me back. This was my 2nd attempt, I'm sending out a letter.

## 2022-07-26 NOTE — Telephone Encounter (Signed)
Called patient with results from nuclear stress test and echocardiogram.  Patient verbalized understanding.

## 2022-08-21 NOTE — Telephone Encounter (Signed)
Patient wife called in asking if the nurse could give her a call back regarding her husband ENT referrals.Please Advise

## 2022-08-25 MED ORDER — CARVEDILOL 12.5 MG PO TABS
12.5 | ORAL_TABLET | ORAL | 3 refills | Status: AC
Start: 2022-08-25 — End: ?

## 2022-08-25 NOTE — Telephone Encounter (Signed)
No ENT referral has been placed for the patient, MyChart message sent to patient to find out the reason one is needed.    No further action required at this time.

## 2022-09-24 MED ORDER — AMLODIPINE BESYLATE 5 MG PO TABS
5 MG | ORAL_TABLET | ORAL | 3 refills | Status: DC
Start: 2022-09-24 — End: 2023-05-16

## 2022-10-12 ENCOUNTER — Encounter: Admit: 2022-10-12 | Discharge: 2022-10-13 | Payer: PRIVATE HEALTH INSURANCE | Primary: Internal Medicine

## 2022-10-12 DIAGNOSIS — R972 Elevated prostate specific antigen [PSA]: Secondary | ICD-10-CM

## 2022-10-12 NOTE — Progress Notes (Signed)
 Raymond [name] presents today for lab draw per [facility] order.   Dr. Hellen was present in the clinic as incident to.     PSA obtained via venipuncture without any difficulty.    Patient will be notified with lab results.       Orders Placed This Encounter   Procedures    COLLECTION VENOUS BLOOD,VENIPUNCTURE    Prostate Specific Antigen, Total       Kandi Irving, MA

## 2022-10-13 LAB — PROSTATE SPECIFIC ANTIGEN, TOTAL: PSA: 4.5 ng/mL — ABNORMAL HIGH (ref 0.00–4.00)

## 2022-10-13 NOTE — Telephone Encounter (Addendum)
 Faxed cardiac clearance request from [facility] for procedure on 11/24/22.    Verbal order and read back per Jun K Chung, MD  Low risk for surgery     Fax form back to provider

## 2022-10-13 NOTE — Other (Signed)
PSA stable. Follow up as scheduled.

## 2022-10-18 MED ORDER — SIMVASTATIN 40 MG PO TABS
40 | ORAL_TABLET | ORAL | 3 refills | Status: DC
Start: 2022-10-18 — End: 2023-10-15

## 2022-10-19 ENCOUNTER — Encounter: Attending: Physician Assistant | Primary: Internal Medicine

## 2022-10-26 ENCOUNTER — Inpatient Hospital Stay: Admit: 2022-10-26 | Payer: MEDICARE | Primary: Internal Medicine

## 2022-10-26 ENCOUNTER — Encounter: Admit: 2022-10-26 | Discharge: 2022-10-26 | Payer: BLUE CROSS/BLUE SHIELD | Primary: Internal Medicine

## 2022-10-26 DIAGNOSIS — I10 Essential (primary) hypertension: Secondary | ICD-10-CM

## 2022-10-26 LAB — CBC WITH AUTO DIFFERENTIAL
Basophils %: 1 % (ref 0–2)
Basophils Absolute: 0.1 10*3/uL (ref 0.0–0.1)
Eosinophils %: 7 % — ABNORMAL HIGH (ref 0–5)
Eosinophils Absolute: 0.4 10*3/uL (ref 0.0–0.4)
Hematocrit: 38.8 % (ref 36.0–48.0)
Hemoglobin: 12.8 g/dL — ABNORMAL LOW (ref 13.0–16.0)
Immature Granulocytes %: 0 % (ref 0.0–0.5)
Immature Granulocytes Absolute: 0 10*3/uL (ref 0.00–0.04)
Lymphocytes %: 23 % (ref 21–52)
Lymphocytes Absolute: 1.3 10*3/uL (ref 0.9–3.6)
MCH: 31.3 pg (ref 24.0–34.0)
MCHC: 33 g/dL (ref 31.0–37.0)
MCV: 94.9 FL (ref 78.0–100.0)
MPV: 10.8 FL (ref 9.2–11.8)
Monocytes %: 10 % (ref 3–10)
Monocytes Absolute: 0.5 10*3/uL (ref 0.05–1.2)
Neutrophils %: 58 % (ref 40–73)
Neutrophils Absolute: 3.1 10*3/uL (ref 1.8–8.0)
Nucleated RBCs: 0 /100{WBCs}
Platelets: 169 10*3/uL (ref 135–420)
RBC: 4.09 M/uL — ABNORMAL LOW (ref 4.35–5.65)
RDW: 13.7 % (ref 11.6–14.5)
WBC: 5.4 10*3/uL (ref 4.6–13.2)
nRBC: 0 10*3/uL (ref 0.00–0.01)

## 2022-10-26 LAB — COMPREHENSIVE METABOLIC PANEL
ALT: 23 U/L (ref 16–61)
AST: 15 U/L (ref 10–38)
Albumin/Globulin Ratio: 1.5 (ref 0.8–1.7)
Albumin: 3.6 g/dL (ref 3.4–5.0)
Alk Phosphatase: 59 U/L (ref 45–117)
Anion Gap: 8 mmol/L (ref 3.0–18)
BUN/Creatinine Ratio: 13 (ref 12–20)
BUN: 17 mg/dL (ref 7.0–18)
CO2: 24 mmol/L (ref 21–32)
Calcium: 8.6 mg/dL (ref 8.5–10.1)
Chloride: 108 mmol/L (ref 100–111)
Creatinine: 1.33 mg/dL — ABNORMAL HIGH (ref 0.6–1.3)
Est, Glom Filt Rate: 55 mL/min/{1.73_m2} — ABNORMAL LOW (ref >60)
Globulin: 2.4 g/dL (ref 2.0–4.0)
Glucose: 106 mg/dL — ABNORMAL HIGH (ref 74–99)
Potassium: 3.8 mmol/L (ref 3.5–5.5)
Sodium: 140 mmol/L (ref 136–145)
Total Bilirubin: 0.5 mg/dL (ref 0.2–1.0)
Total Protein: 6 g/dL — ABNORMAL LOW (ref 6.4–8.2)

## 2022-10-26 LAB — TSH + FREE T4 PANEL
T4 Free: 1 ng/dL (ref 0.7–1.5)
TSH, 3rd Generation: 2.87 u[IU]/mL (ref 0.36–3.74)

## 2022-10-26 LAB — LIPID PANEL
Chol/HDL Ratio: 1.8 (ref 0–5.0)
Cholesterol, Total: 124 mg/dL (ref <200)
HDL: 69 mg/dL — ABNORMAL HIGH (ref 40–60)
LDL Cholesterol: 47.8 mg/dL (ref 0–100)
Triglycerides: 36 mg/dL (ref <150)
VLDL Cholesterol Calculated: 7.2 mg/dL

## 2022-10-26 LAB — HEMOGLOBIN A1C W/O EAG: Hemoglobin A1C: 5.5 % (ref 4.2–5.6)

## 2022-11-01 ENCOUNTER — Ambulatory Visit
Admit: 2022-11-01 | Discharge: 2022-11-01 | Payer: BLUE CROSS/BLUE SHIELD | Attending: Interventional Cardiology | Primary: Internal Medicine

## 2022-11-01 VITALS — BP 124/78 | HR 51 | Ht 69.0 in | Wt 189.0 lb

## 2022-11-01 DIAGNOSIS — I2510 Atherosclerotic heart disease of native coronary artery without angina pectoris: Secondary | ICD-10-CM

## 2022-11-01 NOTE — Progress Notes (Signed)
 HISTORY OF PRESENT ILLNESS  Raymond [name]  77 y.o. male     Chief Complaint   Patient presents with    Follow-up     6 month f/u     Edema     Bilateral ankle edema worse at night        ASSESSMENT and PLAN    The primary encounter diagnosis was Atherosclerosis of native coronary artery without angina pectoris, unspecified whether native or transplanted heart. Diagnoses of Ventricular tachycardia ([facility]) and Shortness of breath were also pertinent to this visit.    [name], previous Dr. Jolene patient, has history of CAD.  Back in January 2006, he presented with sinking feeling and nonsustained VT.  Ultimately, he underwent CABG with LIMA to LAD as well as sequential SVG to diagonal and OM branch.  Since that time, he has had nuclear scan in 2012 which showed EF of 55% with fixed inferior defect.  His nuclear scan in February 2018 showed inferior fixed defect without ischemia.  His EF was noted to be 52%.  Because of bradycardia, he did have Holter monitor in February 2018 which showed asymptomatic sinus bradycardia.  His minimum heart rate was 41 bpm.  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  He does have history hypertension, and hyperlipidemia.  He denies active tobacco use.  He has chronic renal disease with his creatinine in July 2020 to be about 1.65 from baseline of 1.36.  Diuretic regimen was decreased at that time.  In March 2022, he had nuclear scan performed which revealed EF 52% with medium size inferior/inferolateral fixed defect.  This was unchanged from 2012 study.  In 2024, he was diagnosed with vocal cord paralysis.  He is scheduled to undergo laryngoplasty by Dr. Norleen Peasant in October 2024.  In May 2024, his nuclear scan showed EF 48% with fixed inferobasal defect unchanged from 2012, and 2022 study.  His echocardiogram in May 2024 revealed EF 45-50% with mild MR.    Medication regimen reviewed and  current cardiac regimen will be continued.  All new testing since the last office visit was independently reviewed by me.    CAD:    Clinically stable.  HTN:    Well-controlled at 124/78.  Continue current BP regimen.  Rhythm:    Asymptomatic sinus bradycardia 51 bpm.  CHF:    There is no evidence of decompensated CHF noted.  BMI:     His weight today is 189 pounds.  His baseline weight is 190 pounds.  Hyperlipidemia:   Target LDL <70.  Continue Zocor  40 mg daily.  Anti-platelet:   Remains on ASA.      His voice changes have been evaluated.  They have noted a mass behind his thyroid.  Both his echocardiogram and nuclear scan in May 2024 in preparation for head and neck surgery were unremarkable.  He should be low to moderate risk for general anesthesia.    I will see him back in 6 months.  Thank you.    Orders Placed This Encounter   Procedures    EKG 12 Lead     Order Specific Question:   Reason for Exam?     Answer:   Other        Edema      Today, [name] has no complaints of chest pains, increased shortness of breath, or decreased exercise capacity.  He denies any changes in his activity levels.  He still struggles with difficulty speaking and voice changes.  He is preparing to undergo laryngoplasty in October 2024.  He denies any orthopnea or PND.  He denies any palpitation sensation or dizziness.    Review of Systems  14 point review of system is negative unless mentioned in HPI    Physical Exam  Vitals and nursing note reviewed.   Constitutional:       Appearance: Normal appearance.   HENT:      Head: Normocephalic.   Eyes:      Conjunctiva/sclera: Conjunctivae normal.   Neck:      Vascular: No carotid bruit.   Cardiovascular:      Rate and Rhythm: Regular rhythm. Bradycardia present.   Pulmonary:      Breath sounds: Normal breath sounds.   Abdominal:      Palpations: Abdomen is soft.   Musculoskeletal:         General: No swelling.      Cervical back: No rigidity.   Skin:     General: Skin is warm and dry.    Neurological:      General: No focal deficit present.      Mental Status: He is alert and oriented to person, place, and time.   Psychiatric:         Mood and Affect: Mood normal.         Behavior: Behavior normal.            PCP: Dumaran, [name], MD    Past Medical History:   Diagnosis Date    Allergic rhinitis     BPH with obstruction/lower urinary tract symptoms     CAD (coronary artery disease)     IMI w stent of RCA 9/03; s/p CABG X3 1/16    Cervical disc disease 2023    on xray [facility]    CKD (chronic kidney disease)     pre2016    Colon polyp 05/09/2019    Dr Gerlean    COVID-19 vaccine dose declined 04/2021    boosters    Diastasis recti     Dyslipidemia     Elevated prostate specific antigen (PSA)     [facility]    H/O cardiac catheterization 02/29/2004    oRCA 100%.  LM patent.  p/mLAD 85%.  D1 90%.  D2 90%.  CX 45%.  LVEDP 12 mmHg.  EF 45%.  CABG recommended.    H/O cardiovascular stress test     Intermediate risk.  Mild-mod basal inf infarct w/mild-mod peri-infarct isch  mid inf, prox & mid inf/lat walls.  EF unreadable (3/15); ef 56%, tid 0.96, inf/lat fixed defect, neg isch (3/22)    H/O echocardiogram     LVE.  EF 45-50%.  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 Sequential SVG to the diagonal and obtuse marginal branch    OTHER SURGICAL HISTORY  2015    s/p  resection neurofibroma RLE    OTHER SURGICAL HISTORY  2015    R kidney mass removal    TONSILLECTOMY         Current Outpatient Medications   Medication Sig Dispense Refill    simvastatin  (ZOCOR ) 40 MG tablet TAKE 1 TABLET BY MOUTH EVERY NIGHT 90 tablet 3    amLODIPine  (NORVASC ) 5 MG tablet TAKE 1 TABLET BY MOUTH DAILY 90 tablet 3    carvedilol  (COREG ) 12.5 MG tablet TAKE 1 TABLET BY MOUTH TWICE DAILY WITH MEALS 180 tablet 3    losartan  (COZAAR ) 50 MG tablet TAKE 2 TABLETS BY MOUTH IN THE MORNING AND 1 TABLET IN THE EVENING 270 tablet 3    Icosapent  Ethyl (VASCEPA ) 1 g CAPS capsule TAKE 2 CAPSULES BY MOUTH TWICE DAILY WITH MEALS 120 capsule 11    Multiple Vitamin (MULTIVITAMIN PO) Take 1 tablet by mouth daily      aspirin 81 MG chewable tablet Take 1 tablet by mouth daily      triamcinolone (NASACORT) 55 MCG/ACT nasal inhaler INSTILL 2 SPRAYS INTO EACH NOSTRIL DAILY       No current facility-administered medications for this visit.       The patient has a family history of    Social History     Tobacco Use    Smoking status: Never     Passive exposure: Never    Smokeless tobacco: Never   Vaping Use    Vaping status: Never Used   Substance Use Topics    Alcohol use: Yes     Alcohol/week: 2.0 standard drinks of alcohol     Types: 2 Glasses of wine per week    Drug use: No       Lab Results   Component Value Date/Time    CHOL 124 10/26/2022 08:12 AM    HDL 69 10/26/2022 08:12 AM    LDL 47.8 10/26/2022 08:12 AM    LDL 56.2 10/19/2021 08:20 AM        BP Readings from Last 3 Encounters:   11/01/22 124/78   06/19/22 (!) 166/77   06/15/22 136/70        Pulse Readings from Last 3 Encounters:   11/01/22 51   06/19/22 (!) 48   05/03/22 56       Wt Readings from Last 3 Encounters:   11/01/22 85.7 kg (189 lb)   06/19/22 83.9 kg (185 lb)   06/15/22 85.7 kg (189 lb)         EKG: sinus bradycardia, RBBB, Q waves in inferior leads, leads II, III, and aVF, unchanged from previous tracings.     Brittish Bolinger K Markian Glockner, MD   11/01/2022

## 2022-11-01 NOTE — Progress Notes (Signed)
 Alm Sharps presents today for   Chief Complaint   Patient presents with    Follow-up     6 month f/u     Edema     Bilateral ankle edema worse at night        Alm Sharps preferred language for health care discussion is english/other.    Is someone accompanying this pt? no    Is the patient using any DME equipment during OV? no    Depression Screening:  Depression: Not at risk (05/03/2022)    PHQ-2     PHQ-2 Score: 0        Learning Assessment:  No question data found.       Pt currently taking Anticoagulant therapy? no    Pt currently taking Antiplatelet therapy ? ASA 81 mg 1x daily       Coordination of Care:  1. Have you been to the ER, urgent care clinic since your last visit? Hospitalized since your last visit? no    2. Have you seen or consulted any other health care providers outside of the [facility] since your last visit? Include any pap smears or colon screening. no

## 2022-11-02 ENCOUNTER — Ambulatory Visit
Admit: 2022-11-02 | Discharge: 2022-11-02 | Payer: BLUE CROSS/BLUE SHIELD | Attending: Internal Medicine | Primary: Internal Medicine

## 2022-11-02 VITALS — BP 138/72 | HR 57 | Temp 98.30000°F | Resp 18 | Ht 69.0 in | Wt 189.0 lb

## 2022-11-02 DIAGNOSIS — Z0000 Encounter for general adult medical examination without abnormal findings: Secondary | ICD-10-CM

## 2022-11-02 NOTE — Progress Notes (Signed)
 77 y.o. male who presents for RPE    No cardiovascular complaint, active with yardwork, bowls weekly.  Bp controlled when he checks. Saw Dr Rea and NST showed low risk study    We saw him for chronic hoarseness in Jan and he ended up seeing PA Gao/ENT.  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  They biopsied 2 of the 3 lesions which came back neg.  He was referred internally to Dr Paralee and then Dr Adele whom he saw June.  No final recs and they got frustrated so they got separate opinion from Dr Gladis who then referred him to Dr Fidel.   They are no planning on doing surgery to repair the right vocal cord in Oct    No gi issues currently.  Sees NP Aloia for the elevated psa. No bleeding to report specifically from anywhere    Denies polyuria, polydipsia, nocturia, vision change.  Not checking sugars at this time. Weight up slightly     11/02/2021 11/14/2021 01/13/2022 02/09/2022 04/26/2022   Vitals        Weight - Scale 190 lb  190 lb  191 lb 3.2 oz  191 lb  197 lb 9.6 oz      *LAST MEDICARE WELLNESS EXAM: 09/10/15, 04/07/20, not medicare b    Past Medical History:   Diagnosis Date    Allergic rhinitis     BPH with obstruction/lower urinary tract symptoms     CAD (coronary artery disease)     IMI w stent of RCA 9/03; s/p CABG X3 1/16    Cervical disc disease 2023    on xray [facility]    Cholelithiasis     CT 5/24    CKD (chronic kidney disease)     pre2016    Colon polyp 05/09/2019    Dr Gerlean    COVID-19 vaccine dose declined 04/2021    boosters    Diastasis recti     Dyslipidemia     Elevated prostate specific antigen (PSA)     [facility]    H/O cardiac catheterization 02/29/2004    oRCA 100%.  LM patent.  p/mLAD 85%.  D1 90%.  D2 90%.  CX 45%.  LVEDP 12 mmHg.  EF 45%.  CABG recommended.    H/O cardiovascular stress test     Intermed risk, basal inf infarct w/mild-mod peri-infarct isch, mid inf/prox & mid inf/lat walls.  EF unreadable (3/15); NST ef 56%, tid 0.96, inf/lat fixed defect, neg isch (3/22);  NST low risk, ef 48%, TID 0.88 (8/24)    H/O echocardiogram     LVE.  EF 45-50%.  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    presented with NSVT; LIMA to LAD, seq SVG to DM and OM; ef 45%    SCC (squamous cell carcinoma)     Thyroid mass 02/2022    PA Lia; LEFT 4.8cm and 1.6cm; bx neg (4/24) [facility]    Ventricular tachycardia, nonsustained (HCC)     Vocal cord paralysis 2024    RIGHT; PA Gao/Dr Adele; Dr Gladis; Dr Fidel     Past Surgical History:   Procedure Laterality Date    COLONOSCOPY N/A 05/09/2019    cologuard+ (1/21); Dr Gerlean (05/09/19) hyperplastic    CORONARY ANGIOPLASTY WITH STENT PLACEMENT  10/12/01  RCA stented using 3.5 x 13 mm Express 2 stent    CORONARY ARTERY BYPASS GRAFT  03/03/04    LIMA to the LAD. Sequential SVG to the diagonal and obtuse marginal branch    OTHER SURGICAL HISTORY  2015    s/p resection neurofibroma RLE    OTHER SURGICAL HISTORY  2015    R kidney mass removal    TONSILLECTOMY       Social History     Socioeconomic History    Marital status: Married     Spouse name: Not on file    Number of children: Not on file    Years of education: Not on file    Highest education level: Not on file   Occupational History    Not on file   Tobacco Use    Smoking status: Never     Passive exposure: Never    Smokeless tobacco: Never   Vaping Use    Vaping status: Never Used   Substance and Sexual Activity    Alcohol use: Yes     Alcohol/week: 2.0 standard drinks of alcohol     Types: 2 Glasses of wine per week    Drug use: No    Sexual activity: Yes     Partners: Female   Other Topics Concern    Not on file   Social History Narrative    Not on file     Social Determinants of Health     Financial Resource Strain: Low Risk  (04/26/2022)    Overall Financial Resource Strain (CARDIA)     Difficulty of Paying Living Expenses: Not hard at all   Food Insecurity: No Food  Insecurity (04/26/2022)    Hunger Vital Sign     Worried About Running Out of Food in the Last Year: Never true     Ran Out of Food in the Last Year: Never true   Transportation Needs: Unknown (04/26/2022)    PRAPARE - Therapist, Art (Medical): Not on file     Lack of Transportation (Non-Medical): No   Physical Activity: Insufficiently Active (11/11/2021)    Exercise Vital Sign     Days of Exercise per Week: 2 days     Minutes of Exercise per Session: 60 min   Stress: Not on file   Social Connections: Not on file   Intimate Partner Violence: Not At Risk (11/11/2021)    Humiliation, Afraid, Rape, and Kick questionnaire     Fear of Current or Ex-Partner: No     Emotionally Abused: No     Physically Abused: No     Sexually Abused: No   Housing Stability: Unknown (04/26/2022)    Housing Stability Vital Sign     Unable to Pay for Housing in the Last Year: Not on file     Number of Places Lived in the Last Year: Not on file     Unstable Housing in the Last Year: No     Current Outpatient Medications   Medication Sig    simvastatin  (ZOCOR ) 40 MG tablet TAKE 1 TABLET BY MOUTH EVERY NIGHT    amLODIPine  (NORVASC ) 5 MG tablet TAKE 1 TABLET BY MOUTH DAILY    carvedilol  (COREG ) 12.5 MG tablet TAKE 1 TABLET BY MOUTH TWICE DAILY WITH MEALS    losartan  (COZAAR ) 50 MG tablet TAKE 2 TABLETS BY MOUTH IN THE MORNING AND 1 TABLET IN THE EVENING    Icosapent  Ethyl (VASCEPA ) 1 g CAPS capsule  TAKE 2 CAPSULES BY MOUTH TWICE DAILY WITH MEALS    Multiple Vitamin (MULTIVITAMIN PO) Take 1 tablet by mouth daily    aspirin 81 MG chewable tablet Take 1 tablet by mouth daily    triamcinolone (NASACORT) 55 MCG/ACT nasal inhaler INSTILL 2 SPRAYS INTO EACH NOSTRIL DAILY     No current facility-administered medications for this visit.     Allergies   Allergen Reactions    Ace Inhibitors Cough    Bee Venom Hives     REVIEW OF SYSTEMS: colo 4/21 Dr Gerlean    Vitals:    11/02/22 0953 11/02/22 1000   BP: (!) 162/87 138/72   Pulse: 57  57   Resp: 18    Temp: 98.3 F (36.8 C)    TempSrc: Temporal    SpO2: 98%    Weight: 85.7 kg (189 lb)    Height: 1.753 m (5' 9)    A&O x3  Affect is appropriate.  Mood stable  No apparent distress  Anicteric, no JVD, adenopathy or thyromegaly.   No carotid bruits or radiated murmur  Lungs clear to auscultation, no wheezes or rales  Heart showed regular rate and rhythm. No murmur, rubs, gallops  Abdomen soft nontender, no hepatosplenomegaly or masses.  Diastasis noted  Extremities with 1+ ankle edema.  Pulses 1-2+ symmetrically    LABS  From 2/16 showed gluc 101, cr 1.28, gfr 56,  alt 12,    chol 143, tg 65, hdl 61, ldl-c 69, wbc 5.0, hb 13.3, plt 175, tsh 2.94, ua neg  From 2/17 showed gluc 96,   cr 1.19, gfr>60, alt 29, hba1c 5.4, umar 5.0,  chol 170, tg 65, hdl 66, ldl-c 91, wbc 5.3, hb 13.6, plt 184, tsh 2.92, ua neg  From 2/18 showed gluc 104, cr 1.33, gfr 53,  alt 13, hba1c 5.6, umar 6.0,  chol 121, tg 51, hdl 58, ldl-c 53, wbc 5.2, hb 13.6, plt 177, tsh 3.09  From 8/18 showed gluc 104, cr 1.26, gfr 56,  alt 33,    chol 135, tg 58, hdl 61, ldl-c 62, wbc 6.0, hb 13.6, plt 180  From 8/19 showed gluc 97,   cr 1.36, gfr 51,     chol 134, tg 82, hdl 51, ldl-c 67,                 psa 2.90  From 2/20 showed gluc 96,   cr 1.28, gfr 55,  alt 24,    chol 137, tg 66, hdl 57, ldl-c 67  From 7/20 showed gluc 93,   cr 1.67, gfr 40,  alt 27,     chol 151, tg 95, hdl 58, ldl-c 74  From 1/21 showed gluc 95,   cr 1.74, gfr 39,  alt 25, hba1c 5.5,   chol 134, tg 84, hdl 50, ldl-c 67, wbc 6.0, hb 12.9, plt 194,              psa 5.30  From 4/21 showed gluc 98,   cr 1.37, gfr 51  From 8/21 showed gluc 106, cr 1.28, gfr 55,  alt 21, hba1c 5.3   chol 125, tg 45, hdl 63, ldl-c 53  From 2/22 showed gluc 97,   cr 1.23, gfr 57,  hba1c 5.4,           wbc 5.6, hb 12.0, plt 175  From 3/22 showed  fe 70, %sat 24, ferritin 70, b12 434, fol>20, spep neg  From 8/22 showed                  psa 4.42  From 9/22 showed  gluc 94,   cr 1.46, gfr 47, alt 17, hba1c 5.3,   chol 133, tg 64, hdl 58, ldl-c 62, wbc 6.4, hb 12.9, plt 165  From 3/23 showed gluc 103, cr 1.29, gfr 57,             wbc 5.7, hb 13.1, plt 177  From 9/23 showed gluc 106, cr 1.33, gfr 55, alt 33, hba1c 5.2,   chol 133, tg 59, hdl 65, ldl-c 56,              ua neg  From 3/24 showed gluc 105, cr 1.28, gfr 58,  hba1c 5.4,           wbc 4.8, hb 13.2, plt 161,              psa 3.99    Results for orders placed or performed during the hospital encounter of 10/26/22 (from the past 2160 hour(s))   CBC with Auto Differential   Result Value Ref Range    WBC 5.4 4.6 - 13.2 K/uL    RBC 4.09 (L) 4.35 - 5.65 M/uL    Hemoglobin 12.8 (L) 13.0 - 16.0 g/dL    Hematocrit 61.1 63.9 - 48.0 %    MCV 94.9 78.0 - 100.0 FL    MCH 31.3 24.0 - 34.0 PG    MCHC 33.0 31.0 - 37.0 g/dL    RDW 86.2 88.3 - 85.4 %    Platelets 169 135 - 420 K/uL    MPV 10.8 9.2 - 11.8 FL    Nucleated RBCs 0.0 0 PER 100 WBC    nRBC 0.00 0.00 - 0.01 K/uL    Neutrophils % 58 40 - 73 %    Lymphocytes % 23 21 - 52 %    Monocytes % 10 3 - 10 %    Eosinophils % 7 (H) 0 - 5 %    Basophils % 1 0 - 2 %    Immature Granulocytes % 0 0.0 - 0.5 %    Neutrophils Absolute 3.1 1.8 - 8.0 K/UL    Lymphocytes Absolute 1.3 0.9 - 3.6 K/UL    Monocytes Absolute 0.5 0.05 - 1.2 K/UL    Eosinophils Absolute 0.4 0.0 - 0.4 K/UL    Basophils Absolute 0.1 0.0 - 0.1 K/UL    Immature Granulocytes Absolute 0.0 0.00 - 0.04 K/UL    Differential Type AUTOMATED     Comprehensive Metabolic Panel   Result Value Ref Range    Sodium 140 136 - 145 mmol/L    Potassium 3.8 3.5 - 5.5 mmol/L    Chloride 108 100 - 111 mmol/L    CO2 24 21 - 32 mmol/L    Anion Gap 8 3.0 - 18 mmol/L    Glucose 106 (H) 74 - 99 mg/dL    BUN 17 7.0 - 18 MG/DL    Creatinine 8.66 (H) 0.6 - 1.3 MG/DL    BUN/Creatinine Ratio 13 12 - 20      Est, Glom Filt Rate 55 (L) >60 ml/min/1.73m2    Calcium 8.6 8.5 - 10.1 MG/DL    Total Bilirubin 0.5 0.2 - 1.0 MG/DL    ALT 23 16 - 61 U/L    AST 15 10 -  38 U/L  Alk Phosphatase 59 45 - 117 U/L    Total Protein 6.0 (L) 6.4 - 8.2 g/dL    Albumin 3.6 3.4 - 5.0 g/dL    Globulin 2.4 2.0 - 4.0 g/dL    Albumin/Globulin Ratio 1.5 0.8 - 1.7     HEMOGLOBIN A1C W/O EAG   Result Value Ref Range    Hemoglobin A1C 5.5 4.2 - 5.6 %   Lipid Panel   Result Value Ref Range    LIPID PANEL        Cholesterol, Total 124 <200 MG/DL    Triglycerides 36 <849 MG/DL    HDL 69 (H) 40 - 60 MG/DL    LDL Cholesterol 52.1 0 - 100 MG/DL    VLDL Cholesterol Calculated 7.2 MG/DL    Chol/HDL Ratio 1.8 0 - 5.0     TSH + Free T4 Panel   Result Value Ref Range    TSH, 3rd Generation 2.87 0.36 - 3.74 uIU/mL    T4 Free 1.0 0.7 - 1.5 NG/DL   Results for orders placed or performed in visit on 10/12/22 (from the past 2160 hour(s))   Prostate Specific Antigen, Total   Result Value Ref Range    PSA 4.50 (H) 0.00 - 4.00 ng/mL     We reviewed the patient's labs from the last several visits to point out trends in the numbers        Patient Active Problem List   Diagnosis    CKD (chronic kidney disease) stage 3, GFR 30-59 ml/min (HCC)    Coronary atherosclerosis of native coronary artery    Elevated prostate specific antigen (PSA)    BPH with obstruction/lower urinary tract symptoms    Hyperlipidemia    Colon polyp    Primary hypertension    Impaired fasting glucose    Thyroid mass    Vocal cord paralysis     Assessment and plan:  1.  CHD.  F/U Dr Rea  2.  BPH/LUTS, elev psa.  F/u PA Aloia  3.  HLP.  Continue current regimen.  4.  HTN.  Continue current  5.  IFG.  Lifestyle and dietary measures. Further weight loss would be ideal  6.  CKD.  Stable since 2016 at least  7.  Colon polyps.  Fiber, colonoscopy 2026    NEW ISSUES  8.  Thyroid mass.  Per Dr Martin/Dr Sinacori  9.  Vocal cord paralysis, chronic hoarseness.  Per Dr Sinacori  10.  Mild anemia.  Previous neg lab evaluation, will repeat  11.  Advocated RSV and hdflu      RTC 3/25    Instructions above were handwritten on the lab results sheet that I gave  the patient today    Above conditions discussed at length and patient vocalized understanding.  All questions answered to patient satisfaction     Diagnosis Orders   1. Physical exam        2. Anemia, unspecified type  Electrophoresis Protein, Serum    Iron and TIBC    Ferritin    Methylmalonic Acid, Serum    Reticulocytes    Vitamin B12 & Folate      3. Atherosclerosis of native coronary artery of native heart without angina pectoris        4. Primary hypertension        5. Impaired fasting glucose        6. BPH with obstruction/lower urinary tract symptoms        7. Stage 3a  chronic kidney disease (HCC)        8. Hyperlipidemia, unspecified hyperlipidemia type        9. Thyroid mass        10. Vocal cord paralysis

## 2022-11-02 NOTE — Progress Notes (Signed)
 Raymond [name] presents today for   Chief Complaint   Patient presents with    6 Month Follow-Up    Hypertension       "Have you been to the ER, urgent care clinic since your last visit?  Hospitalized since your last visit?"    NO    "Have you seen or consulted any other health care providers outside of [facility] since your last visit?"    NO

## 2022-11-09 ENCOUNTER — Inpatient Hospital Stay: Admit: 2022-11-09 | Payer: MEDICARE | Primary: Internal Medicine

## 2022-11-09 ENCOUNTER — Encounter: Admit: 2022-11-09 | Discharge: 2022-11-09 | Payer: BLUE CROSS/BLUE SHIELD | Primary: Internal Medicine

## 2022-11-09 DIAGNOSIS — D649 Anemia, unspecified: Secondary | ICD-10-CM

## 2022-11-09 LAB — IRON AND TIBC
Iron % Saturation: 22 % (ref 20–50)
Iron: 59 ug/dL (ref 50–175)
TIBC: 264 ug/dL (ref 250–450)

## 2022-11-09 LAB — VITAMIN B12 & FOLATE
Folate: >20 ng/mL — ABNORMAL HIGH (ref 3.10–17.50)
Vitamin B-12: 506 pg/mL (ref 211–911)

## 2022-11-09 LAB — FERRITIN: Ferritin: 78 ng/mL (ref 8–388)

## 2022-11-09 LAB — RETICULOCYTES: Reticulocyte Count,Automated: 1.4 % (ref 0.5–2.5)

## 2022-11-13 LAB — METHYLMALONIC ACID, SERUM: Methylmalonic Acid: 176 nmol/L (ref 0–378)

## 2022-11-15 LAB — ELECTROPHORESIS PROTEIN, SERUM
Albumin/Globulin Ratio: 1.5 (ref 0.7–1.7)
Albumin: 3.5 g/dL (ref 2.9–4.4)
Alpha-1-Globulin: 0.2 g/dL (ref 0.0–0.4)
Alpha-2-Globulin: 0.4 g/dL (ref 0.4–1.0)
Beta Globulin: 0.8 g/dL (ref 0.7–1.3)
Gamma Globulin: 1 g/dL (ref 0.4–1.8)
Globulin: 2.4 g/dL (ref 2.2–3.9)
Total Protein: 5.9 g/dL — ABNORMAL LOW (ref 6.0–8.5)

## 2022-11-17 ENCOUNTER — Encounter

## 2022-11-28 ENCOUNTER — Ambulatory Visit: Admit: 2022-11-28 | Payer: PRIVATE HEALTH INSURANCE | Attending: Physician Assistant | Primary: Internal Medicine

## 2022-11-28 DIAGNOSIS — N401 Enlarged prostate with lower urinary tract symptoms: Secondary | ICD-10-CM

## 2022-11-28 LAB — AMB POC URINALYSIS DIP STICK AUTO W/O MICRO
Bilirubin, Urine, POC: NEGATIVE
Blood, Urine, POC: NEGATIVE
Glucose, Urine, POC: NEGATIVE
Ketones, Urine, POC: NEGATIVE
Leukocyte Esterase, Urine, POC: NEGATIVE
Nitrite, Urine, POC: NEGATIVE
Protein, Urine, POC: NEGATIVE
Specific Gravity, Urine, POC: 1.015 (ref 1.001–1.035)
Urobilinogen, POC: 0.2
pH, Urine, POC: 7 (ref 4.6–8.0)

## 2022-11-28 LAB — AMB POC PVR, MEAS,POST-VOID RES,US,NON-IMAGING: PVR, POC: 70 mL

## 2022-11-28 NOTE — Progress Notes (Signed)
Raymond [name]  1945-10-01        DR. Kennon Portela is the supervising physician for this day.       Diagnosis Orders   1. BPH with obstruction/lower urinary tract symptoms        2. Elevated PSA        3. Prostate cancer screening                ASSESSMENT:   UA today: negative  PVR today: 70 cc      1. BPH wo LUTS              No medical therapy           2. Elevated PSA              Reviewed PSA from 10/12/22- 4.5     04/07/22- 3.99  09/30/21- 5.05  09/30/20- 4.42   07/09/2019 - 3.89ng/mL   04/15/19- 4.87   02/10/19- 5.3              DRE 11/28/22: 40 grams, benign    3. Microhematuria   Microscopy 10/24/21- no blood      4. Hx of vocalized vocal chord surgery 11/2022          Patient has undulating PSA- stable, still below max for age range.       RTC in 1 year with PSA prior and DRE           DISCUSSION:    No chief complaint on file.      HISTORY OF PRESENT ILLNESS:  Raymond [name] is a 77 y.o. male  who presents today in follow up for elevated PSA. History of undulating PSA.      He is not currently taking any GU  Medications, and has no urinary complaints.       Frequency: q4-5 hrs   Nocturia: 1-2x, notes nasal drip lately at night   Urgency: only if he waits too long, no UUI   Feels like emptying bladder well   FOS: strong, weaker at night      No new bone or back pain. Old left hip pain   No unintentional weight loss   Good appetite      Denies flank pain, gross hematuria, dysuria, asymptomatic for infection. No f/c/n/v.       No family history of prostate, bladder or kidney cancer   Never smoker   Worked in Clinical research associate in [employer]            REVIEW OF LABS & IMAGING:  Lab Results   Component Value Date/Time    PSA 4.50 10/12/2022 10:50 AM    PSA 3.99 04/07/2022 09:48 AM    PSA 5.05 09/30/2021 08:48 AM    PSA 5.3 02/10/2019 11:00 AM    PSA 2.9 09/10/2017 09:35 AM                No data to display                  Past Medical History:   Diagnosis Date    Allergic rhinitis     BPH with obstruction/lower urinary tract symptoms     CAD  (coronary artery disease)     IMI w stent of RCA 9/03; s/p CABG X3 1/16    Cervical disc disease 2023    on xray [facility]    Cholelithiasis     CT 5/24    CKD (chronic kidney disease)  [id number]    Colon polyp 05/09/2019    Dr Hart Rochester    COVID-19 vaccine dose declined 04/2021    boosters    Diastasis recti     Dyslipidemia     Elevated prostate specific antigen (PSA)     [facility]    H/O cardiac catheterization 02/29/2004    oRCA 100%.  LM patent.  p/mLAD 85%.  D1 90%.  D2 90%.  CX 45%.  LVEDP 12 mmHg.  EF 45%.  CABG recommended.    H/O cardiovascular stress test     Intermed risk, basal inf infarct w/mild-mod peri-infarct isch, mid inf/prox & mid inf/lat walls.  EF unreadable (3/15); NST ef 56%, tid 0.96, inf/lat fixed defect, neg isch (3/22); NST low risk, ef 48%, TID 0.88 (8/24)    H/O echocardiogram     LVE.  EF 45-50%.  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    presented with NSVT; LIMA to LAD, seq SVG to DM and OM; ef 45%    SCC (squamous cell carcinoma)     Thyroid mass 02/2022    PA Orma Render; LEFT 4.8cm and 1.6cm; bx neg (4/24) [facility]    Ventricular tachycardia, nonsustained (HCC)     Vocal cord paralysis 2024    RIGHT; PA Gao/Dr Grace Isaac; Dr Daphine Deutscher; Dr Bluford Kaufmann       Past Surgical History:   Procedure Laterality Date    COLONOSCOPY N/A 05/09/2019    cologuard+ (1/21); Dr Hart Rochester (05/09/19) hyperplastic    CORONARY ANGIOPLASTY WITH STENT PLACEMENT  10/12/01    RCA stented using 3.5 x 13 mm Express 2 stent    CORONARY ARTERY BYPASS GRAFT  03/03/04    LIMA to the LAD. Sequential SVG to the diagonal and obtuse marginal branch    OTHER SURGICAL HISTORY  2015    s/p resection neurofibroma RLE    OTHER SURGICAL HISTORY  2015    R kidney mass removal    TONSILLECTOMY         Social History     Tobacco Use    Smoking status: Never     Passive exposure: Never    Smokeless tobacco: Never   Vaping Use    Vaping  status: Never Used   Substance Use Topics    Alcohol use: Yes     Alcohol/week: 2.0 standard drinks of alcohol     Types: 2 Glasses of wine per week    Drug use: No       Allergies   Allergen Reactions    Ace Inhibitors Cough    Bee Venom Hives       Family History   Problem Relation Age of Onset    Heart Disease Father         x3 heart bypass    Hypertension Sister     Osteoarthritis Mother        Current Outpatient Medications   Medication Sig Dispense Refill    simvastatin (ZOCOR) 40 MG tablet TAKE 1 TABLET BY MOUTH EVERY NIGHT 90 tablet 3    amLODIPine (NORVASC) 5 MG tablet TAKE 1 TABLET BY MOUTH DAILY 90 tablet 3    carvedilol (COREG) 12.5 MG tablet TAKE 1 TABLET BY MOUTH TWICE DAILY WITH MEALS 180 tablet 3    losartan (COZAAR) 50 MG tablet TAKE 2 TABLETS  BY MOUTH IN THE MORNING AND 1 TABLET IN THE EVENING 270 tablet 3    Icosapent Ethyl (VASCEPA) 1 g CAPS capsule TAKE 2 CAPSULES BY MOUTH TWICE DAILY WITH MEALS 120 capsule 11    Multiple Vitamin (MULTIVITAMIN PO) Take 1 tablet by mouth daily      aspirin 81 MG chewable tablet Take 1 tablet by mouth daily      triamcinolone (NASACORT) 55 MCG/ACT nasal inhaler INSTILL 2 SPRAYS INTO EACH NOSTRIL DAILY       No current facility-administered medications for this visit.         PHYSICAL EXAMINATION:   Ht 1.753 m (5\' 9" )   Wt 83.9 kg (185 lb)   BMI 27.32 kg/m    Constitutional: WDWN, Pleasant and appropriate affect, No acute distress.     CV:  No peripheral swelling noted   Respiratory: No respiratory distress or difficulties   Abdomen:  No abdominal distension noted   GU Male:  11/28/22   [id number] normal to visual inspection, no erythema or irritation, Sphincter with good tone, Rectum with no hemorrhoids,  fissures or masses.  Prostate is 40 grams. Anodular and smooth.    Skin: No jaundice.     Neuro/Psych:  Alert and oriented x 3. Affect appropriate.    LE: No edema   MSK: FROM      LABS TODAY:    No results found for this or any previous visit (from the past  12 hour(s)).       A copy of today's office visit with all pertinent imaging results and labs were sent to the referring physician.        London Sheer, PA-C  [facility]   [address] [place], [address]  [place], [place] [address]  P: [phone]   F: [phone]

## 2022-11-28 NOTE — Addendum Note (Signed)
Addended by: Francina Ames on: 11/28/2022 02:33 PM     Modules accepted: Orders

## 2023-04-13 MED ORDER — ICOSAPENT ETHYL 1 G PO CAPS
1 | ORAL_CAPSULE | Freq: Two times a day (BID) | ORAL | 11 refills | Status: AC
Start: 2023-04-13 — End: ?

## 2023-04-13 NOTE — Telephone Encounter (Signed)
 The patient was told his prescription for Icosapent Ethyl has expired, although he has refills remaining.    Please advise

## 2023-04-13 NOTE — Telephone Encounter (Signed)
 Last Office Visit: 11/02/2022  Next Office Visit: 05/03/2023  Last Refill: 04/07/2022

## 2023-04-13 NOTE — Telephone Encounter (Signed)
 med approved and sent electronically

## 2023-04-26 ENCOUNTER — Inpatient Hospital Stay: Admit: 2023-04-26 | Payer: BLUE CROSS/BLUE SHIELD | Primary: Internal Medicine

## 2023-04-26 ENCOUNTER — Encounter: Admit: 2023-04-26 | Discharge: 2023-04-26 | Payer: BLUE CROSS/BLUE SHIELD | Primary: Internal Medicine

## 2023-04-26 DIAGNOSIS — R7301 Impaired fasting glucose: Secondary | ICD-10-CM

## 2023-04-26 LAB — CBC WITH AUTO DIFFERENTIAL
Basophils %: 1.1 % (ref 0.0–2.0)
Basophils Absolute: 0.05 10*3/uL (ref 0.00–0.10)
Eosinophils %: 5.6 % — ABNORMAL HIGH (ref 0.0–5.0)
Eosinophils Absolute: 0.25 10*3/uL (ref 0.00–0.40)
Hematocrit: 39.8 % (ref 36.0–48.0)
Hemoglobin: 12.8 g/dL — ABNORMAL LOW (ref 13.0–16.0)
Immature Granulocytes %: 0.2 % (ref 0.0–0.5)
Immature Granulocytes Absolute: 0.01 10*3/uL (ref 0.00–0.04)
Lymphocytes %: 28 % (ref 21.0–52.0)
Lymphocytes Absolute: 1.25 10*3/uL (ref 0.90–3.60)
MCH: 30.5 pg (ref 24.0–34.0)
MCHC: 32.2 g/dL (ref 31.0–37.0)
MCV: 94.8 FL (ref 78.0–100.0)
MPV: 10.3 FL (ref 9.2–11.8)
Monocytes %: 9.8 % (ref 3.0–10.0)
Monocytes Absolute: 0.44 10*3/uL (ref 0.05–1.20)
Neutrophils %: 55.3 % (ref 40.0–73.0)
Neutrophils Absolute: 2.47 10*3/uL (ref 1.80–8.00)
Nucleated RBCs: 0 /100{WBCs}
Platelets: 173 10*3/uL (ref 135–420)
RBC: 4.2 M/uL — ABNORMAL LOW (ref 4.35–5.65)
RDW: 14.1 % (ref 11.6–14.5)
WBC: 4.5 10*3/uL — ABNORMAL LOW (ref 4.6–13.2)
nRBC: 0 10*3/uL (ref 0.00–0.01)

## 2023-04-26 LAB — BASIC METABOLIC PANEL
Anion Gap: 4 mmol/L (ref 3.0–18)
BUN/Creatinine Ratio: 15 (ref 12–20)
BUN: 20 mg/dL — ABNORMAL HIGH (ref 7.0–18)
CO2: 27 mmol/L (ref 21–32)
Calcium: 8.5 mg/dL (ref 8.5–10.1)
Chloride: 109 mmol/L (ref 100–111)
Creatinine: 1.31 mg/dL — ABNORMAL HIGH (ref 0.6–1.3)
Est, Glom Filt Rate: 56 mL/min/{1.73_m2} — ABNORMAL LOW (ref >60)
Glucose: 104 mg/dL — ABNORMAL HIGH (ref 74–99)
Potassium: 3.7 mmol/L (ref 3.5–5.5)
Sodium: 140 mmol/L (ref 136–145)

## 2023-04-26 LAB — HEMOGLOBIN A1C W/O EAG: Hemoglobin A1C: 5.4 % (ref 4.2–5.6)

## 2023-04-30 MED ORDER — LOSARTAN POTASSIUM 50 MG PO TABS
50 | ORAL_TABLET | ORAL | 3 refills | Status: AC
Start: 2023-04-30 — End: ?

## 2023-05-03 ENCOUNTER — Ambulatory Visit
Admit: 2023-05-03 | Discharge: 2023-05-03 | Payer: BLUE CROSS/BLUE SHIELD | Attending: Internal Medicine | Primary: Internal Medicine

## 2023-05-03 VITALS — BP 132/70 | HR 52 | Temp 98.20000°F | Resp 16 | Ht 69.0 in | Wt 187.0 lb

## 2023-05-03 DIAGNOSIS — D649 Anemia, unspecified: Secondary | ICD-10-CM

## 2023-05-03 NOTE — Progress Notes (Signed)
 Raymond [name] presents today for   Chief Complaint   Patient presents with    6 Month Follow-Up    Hypertension       "Have you been to the ER, urgent care clinic since your last visit?  Hospitalized since your last visit?"    NO    "Have you seen or consulted any other health care providers outside of [facility] since your last visit?"    NO

## 2023-05-03 NOTE — Progress Notes (Signed)
 78 y.o. male who presents for evaluation    No cardiovascular complaint, active with yardwork, bowls weekly.  Saw Dr Dory Peru last year and no new recs    He successfully underwent vocal cord surgery by Dr Bluford Kaufmann    No gi issues currently.  Sees NP Aloia for the elevated psa. No bleeding and anemia evaluation again neg    Denies polyuria, polydipsia, nocturia, vision change.  Not checking sugars at this time. Weight is down     06/15/2022 06/19/2022 11/01/2022 11/02/2022 11/28/2022 05/03/2023   Vitals         Weight - Scale 189 lb  185 lb  189 lb  189 lb  185 lb  187 lb    Weight - Scale  185 lb          *LAST MEDICARE WELLNESS EXAM: 04/07/20, not medicare b    Past Medical History:   Diagnosis Date    Allergic rhinitis     BPH with obstruction/lower urinary tract symptoms     CAD (coronary artery disease)     IMI w stent of RCA 9/03; s/p CABG X3 1/16    Cervical disc disease 2023    on xray [facility]    Cholelithiasis     CT 5/24    CKD (chronic kidney disease)     pre2016    Colon polyp 05/09/2019    Dr Hart Rochester    COVID-19 vaccine dose declined 04/2021    boosters    Diastasis recti     Dyslipidemia     Elevated prostate specific antigen (PSA)     [facility]    H/O cardiac catheterization 02/29/2004    oRCA 100%.  LM patent.  p/mLAD 85%.  D1 90%.  D2 90%.  CX 45%.  LVEDP 12 mmHg.  EF 45%.  CABG recommended.    H/O cardiovascular stress test     Intermed risk, basal inf infarct w/mild-mod peri-infarct isch, mid inf/prox & mid inf/lat walls.  EF unreadable (3/15); NST ef 56%, tid 0.96, inf/lat fixed defect, neg isch (3/22); NST low risk, ef 48%, TID 0.88 (8/24)    H/O echocardiogram     LVE.  EF 45-50%.  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    presented with NSVT; LIMA to LAD, seq SVG to DM and OM; ef 45%    SCC (squamous cell carcinoma)     Thyroid mass 02/2022    PA Orma Render; LEFT 4.8cm and 1.6cm; bx neg  (4/24) [facility]    Ventricular tachycardia, nonsustained (HCC)     Vocal cord paralysis 2024    RIGHT; PA Gao/Dr Grace Isaac; Dr Daphine Deutscher; Dr Bluford Kaufmann     Past Surgical History:   Procedure Laterality Date    COLONOSCOPY N/A 05/09/2019    cologuard+ (1/21); Dr Hart Rochester (05/09/19) hyperplastic    CORONARY ANGIOPLASTY WITH STENT PLACEMENT  10/12/01    RCA stented using 3.5 x 13 mm Express 2 stent    CORONARY ARTERY BYPASS GRAFT  03/03/04    LIMA to the LAD. Sequential SVG to the diagonal and obtuse marginal branch    OTHER SURGICAL HISTORY  2015    s/p resection neurofibroma RLE    OTHER SURGICAL HISTORY  2015    R kidney mass removal    TONSILLECTOMY  VOCAL CORD SURGERY  2024    Dr Bluford Kaufmann     Social History     Socioeconomic History    Marital status: Married     Spouse name: Not on file    Number of children: Not on file    Years of education: Not on file    Highest education level: Not on file   Occupational History    Not on file   Tobacco Use    Smoking status: Never     Passive exposure: Never    Smokeless tobacco: Never   Vaping Use    Vaping status: Never Used   Substance and Sexual Activity    Alcohol use: Yes     Alcohol/week: 2.0 standard drinks of alcohol     Types: 2 Glasses of wine per week    Drug use: No    Sexual activity: Yes     Partners: Female   Other Topics Concern    Not on file   Social History Narrative    Not on file     Social Drivers of Health     Financial Resource Strain: Low Risk  (04/26/2022)    Overall Financial Resource Strain (CARDIA)     Difficulty of Paying Living Expenses: Not hard at all   Food Insecurity: No Food Insecurity (04/30/2023)    Hunger Vital Sign     Worried About Running Out of Food in the Last Year: Never true     Ran Out of Food in the Last Year: Never true   Transportation Needs: No Transportation Needs (04/30/2023)    PRAPARE - Therapist, art (Medical): No     Lack of Transportation (Non-Medical): No   Physical Activity: Insufficiently Active  (11/11/2021)    Exercise Vital Sign     Days of Exercise per Week: 2 days     Minutes of Exercise per Session: 60 min   Stress: Not on file   Social Connections: Not on file   Intimate Partner Violence: Not At Risk (11/11/2021)    Humiliation, Afraid, Rape, and Kick questionnaire     Fear of Current or Ex-Partner: No     Emotionally Abused: No     Physically Abused: No     Sexually Abused: No   Housing Stability: Low Risk  (04/30/2023)    Housing Stability Vital Sign     Unable to Pay for Housing in the Last Year: No     Number of Times Moved in the Last Year: 0     Homeless in the Last Year: No     Current Outpatient Medications   Medication Sig    losartan (COZAAR) 50 MG tablet TAKE 2 TABLETS BY MOUTH IN THE MORNING AND 1 TABLET IN THE EVENING    Icosapent Ethyl (VASCEPA) 1 g CAPS capsule Take 2 capsules by mouth 2 times daily    simvastatin (ZOCOR) 40 MG tablet TAKE 1 TABLET BY MOUTH EVERY NIGHT    amLODIPine (NORVASC) 5 MG tablet TAKE 1 TABLET BY MOUTH DAILY    carvedilol (COREG) 12.5 MG tablet TAKE 1 TABLET BY MOUTH TWICE DAILY WITH MEALS    Multiple Vitamin (MULTIVITAMIN PO) Take 1 tablet by mouth daily    aspirin 81 MG chewable tablet Take 1 tablet by mouth daily    triamcinolone (NASACORT) 55 MCG/ACT nasal inhaler INSTILL 2 SPRAYS INTO EACH NOSTRIL DAILY     No current facility-administered medications for this visit.  Allergies   Allergen Reactions    Ace Inhibitors Cough    Bee Venom Hives     REVIEW OF SYSTEMS: colo 4/21 Dr Hart Rochester    Vitals:    05/03/23 0959 05/03/23 1002   BP: (!) 151/77 132/70   BP Site: Left Upper Arm Right Upper Arm   Patient Position: Sitting Sitting   BP Cuff Size: Medium Adult Medium Adult   Pulse: 53 52   Resp: 16    Temp: 98.2 F (36.8 C)    TempSrc: Temporal    SpO2: 98%    Weight: 84.8 kg (187 lb)    Height: 1.753 m (5\' 9" )    A&O x3  Affect is appropriate.  Mood stable  No apparent distress  Anicteric, no JVD, adenopathy or thyromegaly.   No carotid bruits or radiated  murmur  Lungs clear to auscultation, no wheezes or rales  Heart showed regular rate and rhythm. No murmur, rubs, gallops  Abdomen soft nontender, no hepatosplenomegaly or masses.  Diastasis noted  Extremities with 1+ ankle edema.  Pulses 1-2+ symmetrically    LABS  From 2/16 showed gluc 101, cr 1.28, gfr 56,  alt 12,    chol 143, tg 65, hdl 61, ldl-c 69, wbc 5.0, hb 13.3, plt 175, tsh 2.94, ua neg  From 2/17 showed gluc 96,   cr 1.19, gfr>60, alt 29, hba1c 5.4, umar 5.0,  chol 170, tg 65, hdl 66, ldl-c 91, wbc 5.3, hb 13.6, plt 184, tsh 2.92, ua neg  From 2/18 showed gluc 104, cr 1.33, gfr 53,  alt 13, hba1c 5.6, umar 6.0,  chol 121, tg 51, hdl 58, ldl-c 53, wbc 5.2, hb 13.6, plt 177, tsh 3.09  From 8/18 showed gluc 104, cr 1.26, gfr 56,  alt 33,    chol 135, tg 58, hdl 61, ldl-c 62, wbc 6.0, hb 13.6, plt 180  From 8/19 showed gluc 97,   cr 1.36, gfr 51,     chol 134, tg 82, hdl 51, ldl-c 67,                 psa 2.90  From 2/20 showed gluc 96,   cr 1.28, gfr 55,  alt 24,    chol 137, tg 66, hdl 57, ldl-c 67  From 7/20 showed gluc 93,   cr 1.67, gfr 40,  alt 27,     chol 151, tg 95, hdl 58, ldl-c 74  From 1/21 showed gluc 95,   cr 1.74, gfr 39,  alt 25, hba1c 5.5,   chol 134, tg 84, hdl 50, ldl-c 67, wbc 6.0, hb 12.9, plt 194,              psa 5.30  From 4/21 showed gluc 98,   cr 1.37, gfr 51  From 8/21 showed gluc 106, cr 1.28, gfr 55,  alt 21, hba1c 5.3   chol 125, tg 45, hdl 63, ldl-c 53  From 2/22 showed gluc 97,   cr 1.23, gfr 57,  hba1c 5.4,           wbc 5.6, hb 12.0, plt 175  From 3/22 showed                            fe 70, %sat 24, ferritin 70, b12 434, fol>20, spep neg  From 8/22 showed                  psa 4.42  From 9/22 showed gluc 94,   cr 1.46, gfr 47, alt 17, hba1c 5.3,   chol 133, tg 64, hdl 58, ldl-c 62, wbc 6.4, hb 12.9, plt 165  From 3/23 showed gluc 103, cr 1.29, gfr 57,             wbc 5.7, hb 13.1, plt 177  From 9/23 showed gluc 106, cr 1.33, gfr 55, alt 33, hba1c 5.2,   chol 133, tg 59, hdl  65, ldl-c 56,              ua neg  From 3/24 showed gluc 105, cr 1.28, gfr 58,  hba1c 5.4,           wbc 4.8, hb 13.2, plt 161,              psa 3.99  From 9/24 showed gluc 106, cr 1.33, gfr 55, alt 23, hba1c 5.5,              chol 124, tg 36, hdl 69, ldl-c 48, wbc 5.4, hb 12.8, plt 169,   psa 4.50, tsh 2.87, ft4 1.00    Results for orders placed or performed during the hospital encounter of 04/26/23 (from the past 2160 hours)   Basic Metabolic Panel   Result Value Ref Range    Sodium 140 136 - 145 mmol/L    Potassium 3.7 3.5 - 5.5 mmol/L    Chloride 109 100 - 111 mmol/L    CO2 27 21 - 32 mmol/L    Anion Gap 4 3.0 - 18 mmol/L    Glucose 104 (H) 74 - 99 mg/dL    BUN 20 (H) 7.0 - 18 MG/DL    Creatinine 1.61 (H) 0.6 - 1.3 MG/DL    BUN/Creatinine Ratio 15 12 - 20      Est, Glom Filt Rate 56 (L) >60 ml/min/1.53m2    Calcium 8.5 8.5 - 10.1 MG/DL   HEMOGLOBIN W9U W/O EAG   Result Value Ref Range    Hemoglobin A1C 5.4 4.2 - 5.6 %   CBC with Auto Differential   Result Value Ref Range    WBC 4.5 (L) 4.6 - 13.2 K/uL    RBC 4.20 (L) 4.35 - 5.65 M/uL    Hemoglobin 12.8 (L) 13.0 - 16.0 g/dL    Hematocrit 04.5 40.9 - 48.0 %    MCV 94.8 78.0 - 100.0 FL    MCH 30.5 24.0 - 34.0 PG    MCHC 32.2 31.0 - 37.0 g/dL    RDW 81.1 91.4 - 78.2 %    Platelets 173 135 - 420 K/uL    MPV 10.3 9.2 - 11.8 FL    Nucleated RBCs 0.0 0 PER 100 WBC    nRBC 0.00 0.00 - 0.01 K/uL    Neutrophils % 55.3 40.0 - 73.0 %    Lymphocytes % 28.0 21.0 - 52.0 %    Monocytes % 9.8 3.0 - 10.0 %    Eosinophils % 5.6 (H) 0.0 - 5.0 %    Basophils % 1.1 0.0 - 2.0 %    Immature Granulocytes % 0.2 0.0 - 0.5 %    Neutrophils Absolute 2.47 1.80 - 8.00 K/UL    Lymphocytes Absolute 1.25 0.90 - 3.60 K/UL    Monocytes Absolute 0.44 0.05 - 1.20 K/UL    Eosinophils Absolute 0.25 0.00 - 0.40 K/UL    Basophils Absolute 0.05 0.00 - 0.10 K/UL    Immature Granulocytes Absolute 0.01 0.00 - 0.04 K/UL  Differential Type AUTOMATED       We reviewed the patient's labs from the last several  visits to point out trends in the numbers        Patient Active Problem List   Diagnosis    CKD (chronic kidney disease) stage 3, GFR 30-59 ml/min (HCC)    Coronary atherosclerosis of native coronary artery    Elevated prostate specific antigen (PSA)    BPH with obstruction/lower urinary tract symptoms    Hyperlipidemia    Colon polyp    Primary hypertension    Impaired fasting glucose    Thyroid mass    Vocal cord paralysis     Assessment and plan:  1.  CHD.  F/U Dr Dory Peru  2.  BPH/LUTS, elev psa.  F/u PA Aloia  3.  HLP.  Continue current regimen.  4.  HTN.  Continue current  5.  IFG.  Lifestyle and dietary measures. Further weight loss would be ideal  6.  CKD.  Stable since 2016 at least  7.  Colon polyps.  Fiber, colonoscopy 2026  8.  ENT.  Per Dr Martin/Dr Bluford Kaufmann  9.  Vocal cord paralysis, chronic hoarseness.  Per Dr Bluford Kaufmann  10.  Anemia and borderline leukocytosis.  Will send to hematology        RTC 9/25    Instructions above were handwritten on the lab results sheet that I gave the patient today    Above conditions discussed at length and patient vocalized understanding.  All questions answered to patient satisfaction     Diagnosis Orders   1. Anemia, unspecified type  External Referral To Hematology Oncology      2. Atherosclerosis of native coronary artery of native heart without angina pectoris        3. Primary hypertension  Comprehensive Metabolic Panel    CBC with Auto Differential      4. Vocal cord paralysis        5. Impaired fasting glucose        6. Stage 3a chronic kidney disease (HCC)        7. Hyperlipidemia, unspecified hyperlipidemia type  Lipid Panel      8. IFG (impaired fasting glucose)  HEMOGLOBIN A1C W/O EAG

## 2023-05-04 ENCOUNTER — Ambulatory Visit: Payer: BLUE CROSS/BLUE SHIELD | Attending: Interventional Cardiology | Primary: Internal Medicine

## 2023-05-16 ENCOUNTER — Ambulatory Visit
Admit: 2023-05-16 | Discharge: 2023-05-16 | Payer: BLUE CROSS/BLUE SHIELD | Attending: Interventional Cardiology | Primary: Internal Medicine

## 2023-05-16 VITALS — BP 140/80 | HR 53 | Ht 69.0 in | Wt 188.0 lb

## 2023-05-16 DIAGNOSIS — I472 Ventricular tachycardia, unspecified (HCC): Secondary | ICD-10-CM

## 2023-05-16 MED ORDER — AMLODIPINE BESYLATE 10 MG PO TABS
10 | ORAL_TABLET | Freq: Every day | ORAL | 3 refills | Status: DC
Start: 2023-05-16 — End: 2023-11-30

## 2023-05-16 NOTE — Progress Notes (Signed)
 Raymond [name] presents today for   Chief Complaint   Patient presents with    Follow-up     6 month f/u        Raymond [name] preferred language for health care discussion is english/other.    Is someone accompanying this pt? no    Is the patient using any DME equipment during OV? no    Depression Screening:  Depression: Not at risk (05/16/2023)    PHQ-2     PHQ-2 Score: 0        Learning Assessment:  Who is the primary learner? Patient    What is the preferred language for health care of the primary learner? ENGLISH    How does the primary learner prefer to learn new concepts? DEMONSTRATION    Answered By patient    Relationship to Learner SELF           Pt currently taking Anticoagulant therapy? no    Pt currently taking Antiplatelet therapy ? ASA 81 mg QD       Coordination of Care:  1. Have you been to the ER, urgent care clinic since your last visit? Hospitalized since your last visit? no    2. Have you seen or consulted any other health care providers outside of the [facility] since your last visit? Include any pap smears or colon screening. no

## 2023-05-16 NOTE — Patient Instructions (Addendum)
 Medication Increasing :  Amlodipine (Norvasc) 10 mg - Take 1 tablet by mouth once a day  - (Until new prescription is picked up or 5 mg tablets run out - take two 5 mg tablets by mouth once a day)

## 2023-05-16 NOTE — Progress Notes (Signed)
 HISTORY OF PRESENT ILLNESS  Raymond [name]  78 y.o. male     Chief Complaint   Patient presents with    Follow-up     6 month f/u        ASSESSMENT and PLAN    The primary encounter diagnosis was Ventricular tachycardia (HCC). Diagnoses of Atherosclerosis of native coronary artery without angina pectoris, unspecified whether native or transplanted heart and Shortness of breath were also pertinent to this visit.    [name], previous Dr. Junius Finner patient, has history of CAD.  Back in January 2006, he presented with sinking feeling and nonsustained VT.  Ultimately, he underwent CABG with LIMA to LAD as well as sequential SVG to diagonal and OM branch.  Since that time, he has had nuclear scan in 2012 which showed EF of 55% with fixed inferior defect.  His nuclear scan in February 2018 showed inferior fixed defect without ischemia.  His EF was noted to be 52%.  Because of bradycardia, he did have Holter monitor in February 2018 which showed asymptomatic sinus bradycardia.  His minimum heart rate was 41 bpm.  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  He does have history hypertension, and hyperlipidemia.  He denies active tobacco use.  He has chronic renal disease with his creatinine in July 2020 to be about 1.65 from baseline of 1.36.  Diuretic regimen was decreased at that time.  In March 2022, he had nuclear scan performed which revealed EF 52% with medium size inferior/inferolateral fixed defect.  This was unchanged from 2012 study.  In 2024, he was diagnosed with vocal cord paralysis.  He is scheduled to undergo laryngoplasty by Dr. Theadore Nan in October 2024.  In May 2024, his nuclear scan showed EF 48% with fixed inferobasal defect unchanged from 2012, and 2022 study.  His echocardiogram in May 2024 revealed EF 45-50% with mild MR.    Medication regimen reviewed and current cardiac regimen will be continued.  All  new testing since the last office visit was independently reviewed by me.    CAD:    Clinically stable.  HTN:   Initially elevated at 170/82.  On recheck, his blood pressure was down to 140/80.  I did increase his amlodipine from 5 mg daily up to 10 mg daily.  Rhythm:    Sinus bradycardia at 53 bpm with first-degree AVB, rare PVC.  Chronic RBBB pattern noted.  CHF:    There is no evidence of decompensated CHF noted.  BMI:     His weight today is 188 pounds.  His baseline weight is 190 pounds.  Hyperlipidemia:   Target LDL <70.  Continue Zocor 40 mg daily.  Anti-platelet:   Remains on ASA.      They have noted a mass behind his thyroid.  Both his echocardiogram and nuclear scan in May 2024 were unremarkable.     I will see him back in 6 months.  Thank you.    Orders Placed This Encounter   Procedures    EKG 12 Lead     Reason for Exam?:   Other        HPI  Today, Raymond [name] has no complaints of chest pains, increased shortness of breath, or decreased exercise capacity.  Despite his age, he remains active physically.  He denies any changes in his activity levels.  He is currently undergoing blood pressure  medication titration by his PCP.  He denies any orthopnea or PND.  He denies any palpitation sensation or dizziness.    Review of Systems  14 point review of system is negative unless mentioned in HPI    Physical Exam  Vitals and nursing note reviewed.   Constitutional:       Appearance: Normal appearance.   HENT:      Head: Normocephalic.   Eyes:      Conjunctiva/sclera: Conjunctivae normal.   Neck:      Vascular: No carotid bruit.   Cardiovascular:      Rate and Rhythm: Regular rhythm. Bradycardia present.   Pulmonary:      Breath sounds: Normal breath sounds.   Abdominal:      Palpations: Abdomen is soft.   Musculoskeletal:         General: No swelling.      Cervical back: No rigidity.   Skin:     General: Skin is warm and dry.   Neurological:      General: No focal deficit present.      Mental Status: He is alert  and oriented to person, place, and time.   Psychiatric:         Mood and Affect: Mood normal.         Behavior: Behavior normal.            PCP: Raymond Hind, MD    Past Medical History:   Diagnosis Date    Allergic rhinitis     BPH with obstruction/lower urinary tract symptoms     CAD (coronary artery disease)     IMI w stent of RCA 9/03; s/p CABG X3 1/16    Cervical disc disease 2023    on xray [facility]    Cholelithiasis     CT 5/24    CKD (chronic kidney disease)     pre2016    Colon polyp 05/09/2019    Dr Hart Rochester    COVID-19 vaccine dose declined 04/2021    boosters    Diastasis recti     Dyslipidemia     Elevated prostate specific antigen (PSA)     [facility]    H/O cardiac catheterization 02/29/2004    oRCA 100%.  LM patent.  p/mLAD 85%.  D1 90%.  D2 90%.  CX 45%.  LVEDP 12 mmHg.  EF 45%.  CABG recommended.    H/O cardiovascular stress test     Intermed risk, basal inf infarct w/mild-mod peri-infarct isch, mid inf/prox & mid inf/lat walls.  EF unreadable (3/15); NST ef 56%, tid 0.96, inf/lat fixed defect, neg isch (3/22); NST low risk, ef 48%, TID 0.88 (8/24)    H/O echocardiogram     LVE.  EF 45-50%.  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    presented with NSVT; LIMA to LAD, seq SVG to DM and OM; ef 45%    SCC (squamous cell carcinoma)     Thyroid mass 02/2022    PA Orma Render; LEFT 4.8cm and 1.6cm; bx neg (4/24) [facility]    Ventricular tachycardia, nonsustained (HCC)     Vocal cord paralysis 2024    RIGHT; PA Gao/Dr Grace Isaac; Dr Daphine Deutscher; Dr Bluford Kaufmann       Past Surgical History:   Procedure Laterality Date    COLONOSCOPY N/A  05/09/2019    cologuard+ (1/21); Dr Hart Rochester (05/09/19) hyperplastic    CORONARY ANGIOPLASTY WITH STENT PLACEMENT  10/12/01    RCA stented using 3.5 x 13 mm Express 2 stent    CORONARY ARTERY BYPASS GRAFT  03/03/04    LIMA to the LAD. Sequential SVG to the diagonal and obtuse  marginal branch    OTHER SURGICAL HISTORY  2015    s/p resection neurofibroma RLE    OTHER SURGICAL HISTORY  2015    R kidney mass removal    TONSILLECTOMY      VOCAL CORD SURGERY  2024    Dr Bluford Kaufmann       Current Outpatient Medications   Medication Sig Dispense Refill    losartan (COZAAR) 50 MG tablet TAKE 2 TABLETS BY MOUTH IN THE MORNING AND 1 TABLET IN THE EVENING 270 tablet 3    Icosapent Ethyl (VASCEPA) 1 g CAPS capsule Take 2 capsules by mouth 2 times daily 120 capsule 11    simvastatin (ZOCOR) 40 MG tablet TAKE 1 TABLET BY MOUTH EVERY NIGHT 90 tablet 3    carvedilol (COREG) 12.5 MG tablet TAKE 1 TABLET BY MOUTH TWICE DAILY WITH MEALS 180 tablet 3    Multiple Vitamin (MULTIVITAMIN PO) Take 1 tablet by mouth daily      aspirin 81 MG chewable tablet Take 1 tablet by mouth daily      triamcinolone (NASACORT) 55 MCG/ACT nasal inhaler INSTILL 2 SPRAYS INTO EACH NOSTRIL DAILY       No current facility-administered medications for this visit.       The patient has a family history of    Social History     Tobacco Use    Smoking status: Never     Passive exposure: Never    Smokeless tobacco: Never   Vaping Use    Vaping status: Never Used   Substance Use Topics    Alcohol use: Yes     Alcohol/week: 2.0 standard drinks of alcohol     Types: 2 Glasses of wine per week    Drug use: No       Lab Results   Component Value Date/Time    CHOL 124 10/26/2022 08:12 AM    HDL 69 10/26/2022 08:12 AM    LDL 47.8 10/26/2022 08:12 AM    LDL 56.2 10/19/2021 08:20 AM        BP Readings from Last 3 Encounters:   05/16/23 (!) 140/80   05/03/23 132/70   11/02/22 138/72        Pulse Readings from Last 3 Encounters:   05/16/23 53   05/03/23 52   11/02/22 57       Wt Readings from Last 3 Encounters:   05/16/23 85.3 kg (188 lb)   05/03/23 84.8 kg (187 lb)   11/28/22 83.9 kg (185 lb)         EKG: normal sinus rhythm, RBBB, occasional PVC noted, unifocal, 1st degree AV block, unchanged from previous tracings.     Synetta Shadow, MD   05/16/2023

## 2023-05-21 NOTE — Telephone Encounter (Signed)
Pt dropped off BP readings

## 2023-08-23 MED ORDER — CARVEDILOL 12.5 MG PO TABS
12.5 | ORAL_TABLET | Freq: Two times a day (BID) | ORAL | 3 refills | 90.00000 days | Status: AC
Start: 2023-08-23 — End: ?

## 2023-10-13 ENCOUNTER — Encounter

## 2023-10-15 NOTE — Telephone Encounter (Signed)
 Last Office Visit: 05/03/2023  Next Office Visit: 11/08/2023  Last Refill: 10/18/2022

## 2023-10-17 MED ORDER — SIMVASTATIN 40 MG PO TABS
40 | ORAL_TABLET | Freq: Every evening | ORAL | 3 refills | Status: AC
Start: 2023-10-17 — End: ?

## 2023-10-31 ENCOUNTER — Inpatient Hospital Stay: Admit: 2023-10-31 | Payer: BLUE CROSS/BLUE SHIELD | Primary: Internal Medicine

## 2023-10-31 ENCOUNTER — Encounter: Admit: 2023-10-31 | Discharge: 2023-10-31 | Payer: BLUE CROSS/BLUE SHIELD | Primary: Internal Medicine

## 2023-10-31 DIAGNOSIS — I10 Essential (primary) hypertension: Principal | ICD-10-CM

## 2023-10-31 LAB — LIPID PANEL
Chol/HDL Ratio: 2.4 (ref 0–5.0)
Cholesterol, Total: 137 mg/dL
HDL: 57 mg/dL (ref 40–60)
LDL Cholesterol: 70 mg/dL (ref 0–100)
Triglycerides: 50 mg/dL (ref 0–150)
VLDL Cholesterol Calculated: 10 mg/dL

## 2023-10-31 LAB — CBC WITH AUTO DIFFERENTIAL
Basophils %: 1.3 % (ref 0.0–2.0)
Basophils Absolute: 0.07 K/UL (ref 0.00–0.10)
Eosinophils %: 6.3 % — ABNORMAL HIGH (ref 0.0–5.0)
Eosinophils Absolute: 0.33 K/UL (ref 0.00–0.40)
Hematocrit: 37.3 % (ref 36.0–48.0)
Hemoglobin: 12 g/dL — ABNORMAL LOW (ref 13.0–16.0)
Immature Granulocytes %: 0.4 % (ref 0.0–0.5)
Immature Granulocytes Absolute: 0.02 K/UL (ref 0.00–0.04)
Lymphocytes %: 27.3 % (ref 21.0–52.0)
Lymphocytes Absolute: 1.44 K/UL (ref 0.90–3.60)
MCH: 29.9 pg (ref 24.0–34.0)
MCHC: 32.2 g/dL (ref 31.0–37.0)
MCV: 93 FL (ref 78.0–100.0)
MPV: 10.5 FL (ref 9.2–11.8)
Monocytes %: 9.5 % (ref 3.0–10.0)
Monocytes Absolute: 0.5 K/UL (ref 0.05–1.20)
Neutrophils %: 55.2 % (ref 40.0–73.0)
Neutrophils Absolute: 2.92 K/UL (ref 1.80–8.00)
Nucleated RBCs: 0 /100{WBCs}
Platelets: 173 K/uL (ref 135–420)
RBC: 4.01 M/uL — ABNORMAL LOW (ref 4.35–5.65)
RDW: 13.7 % (ref 11.6–14.5)
WBC: 5.3 K/uL (ref 4.6–13.2)
nRBC: 0 K/uL (ref 0.00–0.01)

## 2023-10-31 LAB — HEMOGLOBIN A1C W/O EAG: Hemoglobin A1C: 5.4 % (ref 4.2–5.6)

## 2023-10-31 LAB — COMPREHENSIVE METABOLIC PANEL
ALT: 18 U/L (ref 10–50)
AST: 20 U/L (ref 10–38)
Albumin/Globulin Ratio: 1.4 (ref 0.8–1.7)
Albumin: 3.6 g/dL (ref 3.4–5.0)
Alk Phosphatase: 58 U/L (ref 45–117)
Anion Gap: 11 mmol/L (ref 3.0–18.0)
BUN/Creatinine Ratio: 19 (ref 12–20)
BUN: 25 mg/dL — ABNORMAL HIGH (ref 6–23)
CO2: 23 mmol/L (ref 21–32)
Calcium: 8.8 mg/dL (ref 8.6–10.2)
Chloride: 106 mmol/L (ref 98–107)
Creatinine: 1.31 mg/dL — ABNORMAL HIGH (ref 0.6–1.3)
Est, Glom Filt Rate: 55 ml/min/1.73m2 — ABNORMAL LOW (ref >60)
Globulin: 2.5 g/dL (ref 2.0–4.0)
Glucose: 99 mg/dL (ref 74–108)
Potassium: 3.9 mmol/L (ref 3.5–5.5)
Sodium: 140 mmol/L (ref 136–145)
Total Bilirubin: 0.7 mg/dL (ref 0.2–1.0)
Total Protein: 6.1 g/dL — ABNORMAL LOW (ref 6.4–8.2)

## 2023-11-08 ENCOUNTER — Ambulatory Visit
Admit: 2023-11-08 | Discharge: 2023-11-08 | Payer: BLUE CROSS/BLUE SHIELD | Attending: Internal Medicine | Primary: Internal Medicine

## 2023-11-08 VITALS — BP 136/75 | HR 49 | Temp 98.20000°F | Resp 16 | Ht 69.0 in | Wt 188.0 lb

## 2023-11-08 DIAGNOSIS — Z0000 Encounter for general adult medical examination without abnormal findings: Principal | ICD-10-CM

## 2023-11-08 MED ORDER — CHLORTHALIDONE 25 MG PO TABS
25 | ORAL_TABLET | Freq: Every day | ORAL | 5 refills | 60.00000 days | Status: AC
Start: 2023-11-08 — End: ?

## 2023-11-08 NOTE — Progress Notes (Addendum)
 Alm Sharps presents today for   Chief Complaint   Patient presents with    Annual Exam       Have you been to the ER, urgent care clinic since your last visit?  Hospitalized since your last visit?    NO    "Have you seen or consulted any other health care providers outside of [facility] since your last visit?"    YES - When: approximately 1 months ago.  Where and Why: [facility]: follow up; [facility].

## 2023-11-08 NOTE — Progress Notes (Signed)
 78 y.o. male who presents for RPE    No cp, sob, pnd, edema, palps.  Remains active with yardwork, bowls weekly.  Saw Dr Rea 4/25 and had echo done as below.  He has noted inc swelling in the ankles after amlo was inc to 10mg     He successfully underwent vocal cord surgery by Dr Sinacori but his wife was concerned about his snoring sounding different.  He recommended using mouthguard which the pt has not started using as yet    No gi issues currently.  Sees NP Aloia for the prostate and has appt later this year    Denies polyuria, polydipsia, nocturia, vision change.  Not checking sugars at this time. Not able to lose weight     11/01/2022 11/02/2022 11/28/2022 05/03/2023 05/16/2023 11/08/2023   Vitals         Weight - Scale 189 lb  189 lb  185 lb  187 lb  188 lb  188 lb      He saw Dr Dorrie for the anemia, they talked about possible need for bm bx in the future, he has follow up in the near future     Dr Gladis is following the thyroid nodules/mass which had prior neg bx in 2024.  The allergies are flaring even on nasacort but he's not using antihistamine    He reports they will be going on a [employer] in Dec    *LAST MEDICARE WELLNESS EXAM: not medicare b    Past Medical History:   Diagnosis Date    Allergic rhinitis     Anemia     Dr Dorrie (5/25)    BPH with obstruction/lower urinary tract symptoms     CAD (coronary artery disease)     IMI w stent of RCA 9/03; s/p CABG X3 1/16    Cervical disc disease 2023    on xray [facility]    Cholelithiasis     CT 5/24    CKD (chronic kidney disease)     pre2016    Colon polyp 05/09/2019    Dr Gerlean    COVID-19 vaccine dose declined 04/2021    boosters    Diastasis recti     Dyslipidemia     Elevated prostate specific antigen (PSA)     [facility]    H/O cardiac catheterization 02/29/2004    oRCA 100%.  LM patent.  p/mLAD 85%.  D1 90%.  D2 90%.  CX 45%.  LVEDP 12 mmHg.  EF 45%.  CABG recommended.    H/O cardiovascular stress test     Intermed risk, basal inf infarct w/mild-mod  peri-infarct isch, mid inf/prox & mid inf/lat walls.  EF unreadable (3/15); NST ef 56%, tid 0.96, inf/lat fixed defect, neg isch (3/22); NST low risk, ef 48%, TID 0.88 (8/24)    H/O echocardiogram     LVE.  EF 45-50%.  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    presented with NSVT; LIMA to LAD, seq SVG to DM and OM; ef 45%    SCC (squamous cell carcinoma)     Thyroid mass 02/2022    PA Lia; LEFT 4.8cm and 1.6cm; bx neg (4/24) [facility]; Dr Gladis now following    Ventricular tachycardia, nonsustained (  [facility])     Vocal cord paralysis 2024    RIGHT; PA Gao/Dr Adele; Dr Gladis; Dr Fidel     Past Surgical History:   Procedure Laterality Date    COLONOSCOPY N/A 05/09/2019    cologuard+ (1/21); Dr Gerlean (05/09/19) hyperplastic    CORONARY ANGIOPLASTY WITH STENT PLACEMENT  10/12/01    RCA stented using 3.5 x 13 mm Express 2 stent    CORONARY ARTERY BYPASS GRAFT  03/03/04    LIMA to the LAD. Sequential SVG to the diagonal and obtuse marginal branch    OTHER SURGICAL HISTORY  2015    s/p resection neurofibroma RLE    OTHER SURGICAL HISTORY  2015    R kidney mass removal    TONSILLECTOMY      VOCAL CORD SURGERY  2024    Dr Fidel     Social History     Socioeconomic History    Marital status: Married     Spouse name: Not on file    Number of children: Not on file    Years of education: Not on file    Highest education level: Not on file   Occupational History    Not on file   Tobacco Use    Smoking status: Never     Passive exposure: Never    Smokeless tobacco: Never   Vaping Use    Vaping status: Never Used   Substance and Sexual Activity    Alcohol use: Yes     Alcohol/week: 2.0 standard drinks of alcohol     Types: 2 Glasses of wine per week    Drug use: No    Sexual activity: Yes     Partners: Female   Other Topics Concern    Not on file    Social History Narrative    Not on file     Social Drivers of Health     Financial Resource Strain: Low Risk  (04/26/2022)    Overall Financial Resource Strain (CARDIA)     Difficulty of Paying Living Expenses: Not hard at all   Food Insecurity: No Food Insecurity (04/30/2023)    Hunger Vital Sign     Worried About Running Out of Food in the Last Year: Never true     Ran Out of Food in the Last Year: Never true   Transportation Needs: No Transportation Needs (04/30/2023)    PRAPARE - Therapist, art (Medical): No     Lack of Transportation (Non-Medical): No   Physical Activity: Insufficiently Active (11/11/2021)    Exercise Vital Sign     Days of Exercise per Week: 2 days     Minutes of Exercise per Session: 60 min   Stress: Not on file   Social Connections: Not on file   Intimate Partner Violence: Not At Risk (11/11/2021)    Humiliation, Afraid, Rape, and Kick questionnaire     Fear of Current or Ex-Partner: No     Emotionally Abused: No     Physically Abused: No     Sexually Abused: No   Housing Stability: Low Risk  (04/30/2023)    Housing Stability Vital Sign     Unable to Pay for Housing in the Last Year: No     Number of Times Moved in the Last Year: 0     Homeless in the Last Year: No     Current Outpatient Medications   Medication Sig    chlorthalidone (HYGROTON) 25 MG tablet  Take 1 tablet by mouth daily    simvastatin  (ZOCOR ) 40 MG tablet Take 1 tablet by mouth nightly    carvedilol  (COREG ) 12.5 MG tablet TAKE 1 TABLET BY MOUTH TWICE DAILY WITH MEALS    amLODIPine  (NORVASC ) 10 MG tablet Take 1 tablet by mouth daily    losartan  (COZAAR ) 50 MG tablet TAKE 2 TABLETS BY MOUTH IN THE MORNING AND 1 TABLET IN THE EVENING    Icosapent  Ethyl (VASCEPA ) 1 g CAPS capsule Take 2 capsules by mouth 2 times daily    Multiple Vitamin (MULTIVITAMIN PO) Take 1 tablet by mouth daily    aspirin 81 MG chewable tablet Take 1 tablet by mouth daily    triamcinolone (NASACORT) 55 MCG/ACT nasal inhaler INSTILL 2  SPRAYS INTO EACH NOSTRIL DAILY     No current facility-administered medications for this visit.     Allergies   Allergen Reactions    Ace Inhibitors Cough    Bee Venom Hives     REVIEW OF SYSTEMS: colo 4/21 Dr Gerlean    Vitals:    11/08/23 1112   BP: 136/75   Pulse: (!) 49   Resp: 16   Temp: 98.2 F (36.8 C)   TempSrc: Temporal   SpO2: 99%   Weight: 85.3 kg (188 lb)   Height: 1.753 m (5' 9)   A&O x3  Affect is appropriate.  Mood stable  No apparent distress  Anicteric, no JVD, adenopathy or thyromegaly.   No carotid bruits or radiated murmur  Lungs clear to auscultation, no wheezes or rales  Heart showed regular rate and rhythm. No murmur, rubs, gallops  Abdomen soft nontender, no hepatosplenomegaly or masses.  Diastasis noted  Extremities with 2+ bilat edema.  Pulses 1-2+ symmetrically    LABS  From 2/16 showed gluc 101, cr 1.28, gfr 56,  alt 12,    chol 143, tg 65, hdl 61, ldl-c 69, wbc 5.0, hb 13.3, plt 175, tsh 2.94, ua neg  From 2/17 showed gluc 96,   cr 1.19, gfr>60, alt 29, hba1c 5.4, umar 5.0,  chol 170, tg 65, hdl 66, ldl-c 91, wbc 5.3, hb 13.6, plt 184, tsh 2.92, ua neg  From 2/18 showed gluc 104, cr 1.33, gfr 53,  alt 13, hba1c 5.6, umar 6.0,  chol 121, tg 51, hdl 58, ldl-c 53, wbc 5.2, hb 13.6, plt 177, tsh 3.09  From 8/18 showed gluc 104, cr 1.26, gfr 56,  alt 33,    chol 135, tg 58, hdl 61, ldl-c 62, wbc 6.0, hb 13.6, plt 180  From 8/19 showed gluc 97,   cr 1.36, gfr 51,     chol 134, tg 82, hdl 51, ldl-c 67,                 psa 2.90  From 2/20 showed gluc 96,   cr 1.28, gfr 55,  alt 24,    chol 137, tg 66, hdl 57, ldl-c 67  From 7/20 showed gluc 93,   cr 1.67, gfr 40,  alt 27,     chol 151, tg 95, hdl 58, ldl-c 74  From 1/21 showed gluc 95,   cr 1.74, gfr 39,  alt 25, hba1c 5.5,   chol 134, tg 84, hdl 50, ldl-c 67, wbc 6.0, hb 12.9, plt 194,              psa 5.30  From 4/21 showed gluc 98,   cr 1.37, gfr 51  From 8/21 showed gluc 106, cr  1.28, gfr 55,  alt 21, hba1c 5.3   chol 125, tg 45, hdl 63,  ldl-c 53  From 2/22 showed gluc 97,   cr 1.23, gfr 57,  hba1c 5.4,           wbc 5.6, hb 12.0, plt 175  From 3/22 showed                            fe 70, %sat 24, ferritin 70, b12 434, fol>20, spep neg  From 8/22 showed                  psa 4.42  From 9/22 showed gluc 94,   cr 1.46, gfr 47, alt 17, hba1c 5.3,   chol 133, tg 64, hdl 58, ldl-c 62, wbc 6.4, hb 12.9, plt 165  From 3/23 showed gluc 103, cr 1.29, gfr 57,             wbc 5.7, hb 13.1, plt 177  From 9/23 showed gluc 106, cr 1.33, gfr 55, alt 33, hba1c 5.2,   chol 133, tg 59, hdl 65, ldl-c 56,              ua neg  From 3/24 showed gluc 105, cr 1.28, gfr 58,  hba1c 5.4,           wbc 4.8, hb 13.2, plt 161,              psa 3.99  From 9/24 showed gluc 106, cr 1.33, gfr 55, alt 23, hba1c 5.5,              chol 124, tg 36, hdl 69, ldl-c 48, wbc 5.4, hb 12.8, plt 169,   psa 4.50, tsh 2.87, ft4 1.00  From 3/25 showed gluc 104, cr 1.31, gfr 56,             hba1c 5.4,          wbc 4.5, hb 12.8, plt 173    Results for orders placed or performed during the hospital encounter of 10/31/23 (from the past 2160 hours)   Comprehensive Metabolic Panel   Result Value Ref Range    Sodium 140 136 - 145 mmol/L    Potassium 3.9 3.5 - 5.5 mmol/L    Chloride 106 98 - 107 mmol/L    CO2 23 21 - 32 mmol/L    Anion Gap 11 3.0 - 18.0 mmol/L    Glucose 99 74 - 108 mg/dL    BUN 25 (H) 6 - 23 MG/DL    Creatinine 8.68 (H) 0.6 - 1.3 MG/DL    BUN/Creatinine Ratio 19 12 - 20      Est, Glom Filt Rate 55 (L) >60 ml/min/1.81m2    Calcium 8.8 8.6 - 10.2 MG/DL    Total Bilirubin 0.7 0.2 - 1.0 MG/DL    ALT 18 10 - 50 U/L    AST 20 10 - 38 U/L    Alk Phosphatase 58 45 - 117 U/L    Total Protein 6.1 (L) 6.4 - 8.2 g/dL    Albumin 3.6 3.4 - 5.0 g/dL    Globulin 2.5 2.0 - 4.0 g/dL    Albumin/Globulin Ratio 1.4 0.8 - 1.7     HEMOGLOBIN A1C W/O EAG   Result Value Ref Range    Hemoglobin A1C 5.4 4.2 - 5.6 %   Lipid Panel   Result Value Ref Range    Cholesterol, Total 137 MG/DL  Triglycerides 50 0 - 150  MG/DL    HDL 57 40 - 60 MG/DL    LDL Cholesterol 70 0 - 100 MG/DL    VLDL Cholesterol Calculated 10 MG/DL    Chol/HDL Ratio 2.4 0 - 5.0     CBC with Auto Differential   Result Value Ref Range    WBC 5.3 4.6 - 13.2 K/uL    RBC 4.01 (L) 4.35 - 5.65 M/uL    Hemoglobin 12.0 (L) 13.0 - 16.0 g/dL    Hematocrit 62.6 63.9 - 48.0 %    MCV 93.0 78.0 - 100.0 FL    MCH 29.9 24.0 - 34.0 PG    MCHC 32.2 31.0 - 37.0 g/dL    RDW 86.2 88.3 - 85.4 %    Platelets 173 135 - 420 K/uL    MPV 10.5 9.2 - 11.8 FL    Nucleated RBCs 0.0 0 PER 100 WBC    nRBC 0.00 0.00 - 0.01 K/uL    Neutrophils % 55.2 40.0 - 73.0 %    Lymphocytes % 27.3 21.0 - 52.0 %    Monocytes % 9.5 3.0 - 10.0 %    Eosinophils % 6.3 (H) 0.0 - 5.0 %    Basophils % 1.3 0.0 - 2.0 %    Immature Granulocytes % 0.4 0.0 - 0.5 %    Neutrophils Absolute 2.92 1.80 - 8.00 K/UL    Lymphocytes Absolute 1.44 0.90 - 3.60 K/UL    Monocytes Absolute 0.50 0.05 - 1.20 K/UL    Eosinophils Absolute 0.33 0.00 - 0.40 K/UL    Basophils Absolute 0.07 0.00 - 0.10 K/UL    Immature Granulocytes Absolute 0.02 0.00 - 0.04 K/UL    Differential Type AUTOMATED       We reviewed the patient's labs from the last several visits to point out trends in the numbers        Patient Active Problem List   Diagnosis    CKD (chronic kidney disease) stage 3, GFR 30-59 ml/min (HCC)    Coronary atherosclerosis of native coronary artery    Elevated prostate specific antigen (PSA)    BPH with obstruction/lower urinary tract symptoms    Hyperlipidemia    Colon polyp    Primary hypertension    Impaired fasting glucose    Thyroid mass    Vocal cord paralysis     Assessment and plan:  1.  CHD.  F/U Dr Rea  2.  BPH/LUTS, elev psa.  F/u PA Aloia  3.  HLP.  Continue current regimen.  4.  HTN.  Continue current but will add diuretic for edema and possibly cut down amlo dosing in future; follow up 4 mos  5.  IFG.  Lifestyle and dietary measures. Weight loss would be ideal  6.  CKD.  Stable since 2016 at least  7.  Colon polyps.   Fiber, colonoscopy 2026  8.  Thyroid nodules/mass.  Per Dr Gladis  9.  Vocal cord paralysis, chronic hoarseness, s/p surgery.  Follow up Dr Sinacori  10.  Anemia and borderline leukocytosis. Per Dr Dorrie  11.  Allergies.  Add antihis  12.  Advocated RSV      RTC 2/26    Instructions above were handwritten on the lab results sheet that I gave the patient today    Above conditions discussed at length and patient vocalized understanding.  All questions answered to patient satisfaction     Diagnosis Orders   1. Physical exam  2. Primary hypertension  chlorthalidone (HYGROTON) 25 MG tablet      3. Edema, unspecified type  chlorthalidone (HYGROTON) 25 MG tablet      4. Atherosclerosis of native coronary artery of native heart without angina pectoris        5. Impaired fasting glucose  Basic Metabolic Panel    Hemoglobin A1C      6. Stage 3a chronic kidney disease (HCC)        7. Hyperlipidemia, unspecified hyperlipidemia type  Lipid Panel      8. Thyroid mass

## 2023-11-22 ENCOUNTER — Ambulatory Visit: Admit: 2023-11-22 | Discharge: 2023-11-22 | Payer: PRIVATE HEALTH INSURANCE | Primary: Internal Medicine

## 2023-11-22 DIAGNOSIS — N138 Other obstructive and reflux uropathy: Principal | ICD-10-CM

## 2023-11-22 NOTE — Progress Notes (Signed)
"  Raymond [name] presents today for lab draw per PA Aloia order.   NP Cuba Chandra was present in the clinic as incident to.     PSA obtained via venipuncture without any difficulty.    Patient will be notified with lab results.       Orders Placed This Encounter   Procedures    COLLECTION VENOUS BLOOD,VENIPUNCTURE    PSA,  ([facility])       Raymond [name]   "

## 2023-11-23 LAB — PSA,  (DIAGNOSTIC UVA): PSA: 5.38 ng/mL — ABNORMAL HIGH (ref 0.00–4.00)

## 2023-11-26 NOTE — Result Encounter Note (Signed)
"  Will review at follow up  "

## 2023-11-27 ENCOUNTER — Ambulatory Visit
Admit: 2023-11-27 | Discharge: 2023-11-27 | Payer: BLUE CROSS/BLUE SHIELD | Attending: Interventional Cardiology | Primary: Internal Medicine

## 2023-11-27 DIAGNOSIS — I2510 Atherosclerotic heart disease of native coronary artery without angina pectoris: Principal | ICD-10-CM

## 2023-11-27 NOTE — Progress Notes (Signed)
 "Room: 4    Raymond [name],03-08-1945,78 y.o. presents today for   Chief Complaint   Patient presents with    6 Month Follow-Up     ERE:Ilfjmjw, Jones RAMAN, MD  Last office Visit:05/16/23  Last EKG:05/16/23        Cardiac Symptoms:lower extremity edema and dizziness    Wt Readings from Last 3 Encounters:   11/27/23 83.5 kg (184 lb)   11/08/23 85.3 kg (188 lb)   05/16/23 85.3 kg (188 lb)        Vitals:    11/27/23 0831   BP: 125/62   BP Site: Left Upper Arm   Patient Position: Sitting   Pulse: 52   SpO2: 99%   Weight: 83.5 kg (184 lb)   Height: 1.753 m (5' 9)        Since your last visit:  Have you been to the ER or Hospitalized? no      Have you had cardiac tests at non [facility] facilities? no     Recent Testing  Labs:11/08/23      Prior to Admission medications   Medication Sig Start Date End Date Taking? Authorizing Provider   chlorthalidone  (HYGROTON ) 25 MG tablet Take 1 tablet by mouth daily 11/08/23  Yes Dumaran, [name], MD   simvastatin  (ZOCOR ) 40 MG tablet Take 1 tablet by mouth nightly 10/17/23  Yes Dumaran, [name], MD   carvedilol  (COREG ) 12.5 MG tablet TAKE 1 TABLET BY MOUTH TWICE DAILY WITH MEALS 08/23/23  Yes Dumaran, [name], MD   amLODIPine  (NORVASC ) 10 MG tablet Take 1 tablet by mouth daily 05/16/23  Yes Chung, Jun K, MD   losartan  (COZAAR ) 50 MG tablet TAKE 2 TABLETS BY MOUTH IN THE MORNING AND 1 TABLET IN THE EVENING 04/30/23  Yes Pagtalunan, Maria E, APRN - NP   Icosapent  Ethyl (VASCEPA ) 1 g CAPS capsule Take 2 capsules by mouth 2 times daily 04/13/23  Yes Hunte, Noel L, MD   Multiple Vitamin (MULTIVITAMIN PO) Take 1 tablet by mouth daily   Yes Automatic Reconciliation, Ar   aspirin 81 MG chewable tablet Take 1 tablet by mouth daily   Yes [provider]   triamcinolone (NASACORT) 55 MCG/ACT nasal inhaler INSTILL 2 SPRAYS INTO EACH NOSTRIL DAILY 10/25/12  Yes [provider]      ASSESSMENT and PLAN     The primary encounter diagnosis was Ventricular tachycardia (HCC). Diagnoses of  Atherosclerosis of native coronary artery without angina pectoris, unspecified whether native or transplanted heart and Shortness of breath were also pertinent to this visit.     [name], previous Dr. Jolene patient, has history of CAD.  Back in January 2006, he presented with sinking feeling and nonsustained VT.  Ultimately, he underwent CABG with LIMA to LAD as well as sequential SVG to diagonal and OM branch.  Since that time, he has had nuclear scan in 2012 which showed EF of 55% with fixed inferior defect.  His nuclear scan in February 2018 showed inferior fixed defect without ischemia.  His EF was noted to be 52%.  Because of bradycardia, he did have Holter monitor in February 2018 which showed asymptomatic sinus bradycardia.  His minimum heart rate was 41 bpm.  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  He does have history hypertension, and hyperlipidemia.  He denies active tobacco use.  He has chronic renal  disease with his creatinine in July 2020 to be about 1.65 from baseline of 1.36.  Diuretic regimen was decreased at that time.  In March 2022, he had nuclear scan performed which revealed EF 52% with medium size inferior/inferolateral fixed defect.  This was unchanged from 2012 study.  In 2024, he was diagnosed with vocal cord paralysis.  He is scheduled to undergo laryngoplasty by Dr. Norleen Peasant in October 2024.  In May 2024, his nuclear scan showed EF 48% with fixed inferobasal defect unchanged from 2012, and 2022 study.  His echocardiogram in May 2024 revealed EF 45-50% with mild MR.     Medication regimen reviewed and current cardiac regimen will be continued.  All new testing since the last office visit was independently reviewed by me.     CAD:    Clinically stable.  HTN:   Initially elevated at 170/82.  On recheck, his blood pressure was down to 140/80.  I did increase his amlodipine  from 5 mg  daily up to 10 mg daily.  Rhythm:    Sinus bradycardia at 53 bpm with first-degree AVB, rare PVC.  Chronic RBBB pattern noted.  CHF:    There is no evidence of decompensated CHF noted.  BMI:     His weight today is 188 pounds.  His baseline weight is 190 pounds.  Hyperlipidemia:   Target LDL <70.  Continue Zocor  40 mg daily.  Anti-platelet:   Remains on ASA.      They have noted a mass behind his thyroid.  Both his echocardiogram and nuclear scan in May 2024 were unremarkable.     I will see him back in 6 months.  Thank you.                                                   "

## 2023-11-27 NOTE — Progress Notes (Signed)
 "HISTORY OF PRESENT ILLNESS  Raymond [name]  78 y.o. male     Chief Complaint   Patient presents with    6 Month Follow-Up       ASSESSMENT and PLAN    The primary encounter diagnosis was Atherosclerosis of native coronary artery without angina pectoris, unspecified whether native or transplanted heart. Diagnoses of Ventricular tachycardia (HCC) and Shortness of breath were also pertinent to this visit.    Raymond [name], previous Dr. Jolene patient, has history of CAD.  Back in January 2006, he presented with sinking feeling and nonsustained VT.  Ultimately, he underwent CABG with LIMA to LAD as well as sequential SVG to diagonal and OM branch.  Since that time, he has had nuclear scan in 2012 which showed EF of 55% with fixed inferior defect.  His nuclear scan in February 2018 showed inferior fixed defect without ischemia.  His EF was noted to be 52%.  Because of bradycardia, he did have Holter monitor in February 2018 which showed asymptomatic sinus bradycardia.  His minimum heart rate was 41 bpm.  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  He does have history hypertension, and hyperlipidemia.  He denies active tobacco use.  He has chronic renal disease with his creatinine in July 2020 to be about 1.65 from baseline of 1.36.  Diuretic regimen was decreased at that time.  In March 2022, he had nuclear scan performed which revealed EF 52% with medium size inferior/inferolateral fixed defect.  This was unchanged from 2012 study.  In 2024, he was diagnosed with vocal cord paralysis.  He is scheduled to undergo laryngoplasty by Dr. Norleen Peasant in October 2024.  In May 2024, his nuclear scan showed EF 48% with fixed inferobasal defect unchanged from 2012, and 2022 study.  His echocardiogram in May 2024 revealed EF 45-50% with mild MR.    Medication regimen reviewed and current cardiac regimen will be continued.  All new testing  since the last office visit was independently reviewed by me.     CAD:    Symptomatically stable.  HTN:   Well-controlled at 125/62.  He had chlorthalidone  added which has been quite effective.  For the time being, would continue current BP regimen.  I did discuss with him about possibly decreasing amlodipine  if she noted symptomatic hypotension.  All questions were answered.  If he does have hypotension or dizziness, he will let us  know and we would decrease his amlodipine  from 10 mg down to 5 mg daily.  Rhythm:    Asymptomatic sinus bradycardia at 52 bpm with first-degree AVB.  He has chronic RBBB pattern.  If he develops irreversible symptomatic bradycardia, will proceed with permanent pacemaker insertion.  CHF:    There is no evidence of decompensated CHF noted.  BMI:     Weight today is 184 pounds.  His baseline weight is 190 pounds.  Hyperlipidemia:   Target LDL <70.  Continue Zocor  40 mg daily.  Anti-platelet:   Remains on ASA.      I have encouraged him to stay active physically.  If he develops exertional angina or dizzy spells, he will let us  know.     I will see him back in 6 months.  Thank you.    Orders Placed This Encounter   Procedures    EKG 12 Lead     Reason for Exam?:   Other  HPI  Today, Raymond [name] has no complaints of chest pains, or increased dyspnea.  He has not noted any exertional component to chest discomfort or worsening DOE.  He denies any changes in his exercise capacity.  He has noted single episode of dizzy spells after being started on chlorthalidone .  He did not lose consciousness.  He has not noted any exertional dizziness.  He denies any orthopnea or PND.  He denies any palpitation sensations.    Review of Systems  14 point review of system is negative unless mentioned in HPI    Physical Exam  Vitals and nursing note reviewed.   Constitutional:       Appearance: Normal appearance.   HENT:      Head: Normocephalic.   Eyes:      Conjunctiva/sclera: Conjunctivae normal.   Neck:       Vascular: No carotid bruit.   Cardiovascular:      Rate and Rhythm: Regular rhythm. Bradycardia present.   Pulmonary:      Breath sounds: Normal breath sounds.   Abdominal:      Palpations: Abdomen is soft.   Musculoskeletal:         General: No swelling.      Cervical back: No rigidity.   Skin:     General: Skin is warm and dry.   Neurological:      General: No focal deficit present.      Mental Status: He is alert and oriented to person, place, and time.   Psychiatric:         Mood and Affect: Mood normal.         Behavior: Behavior normal.            PCP: Dumaran, [name], MD    Past Medical History:   Diagnosis Date    Allergic rhinitis     Anemia     Dr Dorrie (5/25)    BPH with obstruction/lower urinary tract symptoms     CAD (coronary artery disease)     IMI w stent of RCA 9/03; s/p CABG X3 1/16    Cervical disc disease 2023    on xray [facility]    Cholelithiasis     CT 5/24    CKD (chronic kidney disease)     pre2016    Colon polyp 05/09/2019    Dr Gerlean    COVID-19 vaccine dose declined 04/2021    boosters    Diastasis recti     Dyslipidemia     Elevated prostate specific antigen (PSA)     [facility]    H/O cardiac catheterization 02/29/2004    oRCA 100%.  LM patent.  p/mLAD 85%.  D1 90%.  D2 90%.  CX 45%.  LVEDP 12 mmHg.  EF 45%.  CABG recommended.    H/O cardiovascular stress test     Intermed risk, basal inf infarct w/mild-mod peri-infarct isch, mid inf/prox & mid inf/lat walls.  EF unreadable (3/15); NST ef 56%, tid 0.96, inf/lat fixed defect, neg isch (3/22); NST low risk, ef 48%, TID 0.88 (8/24)    H/O echocardiogram     LVE.  EF 45-50%.  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    presented with NSVT; LIMA to  LAD, seq SVG to DM and OM; ef 45%    SCC (squamous cell carcinoma)     Thyroid mass 02/2022    [facility]; LEFT 4.8cm  and 1.6cm; bx neg (4/24) [facility]; Dr Gladis now following    Ventricular tachycardia, nonsustained [facility])     Vocal cord paralysis 2024    RIGHT; [facility] Gao/Dr Adele; Dr Gladis; Dr Fidel       Past Surgical History:   Procedure Laterality Date    COLONOSCOPY N/A 05/09/2019    cologuard+ (1/21); Dr Gerlean (05/09/19) hyperplastic    CORONARY ANGIOPLASTY WITH STENT PLACEMENT  10/12/01    RCA stented using 3.5 x 13 mm Express 2 stent    CORONARY ARTERY BYPASS GRAFT  03/03/04    LIMA to the LAD. Sequential SVG to the diagonal and obtuse marginal branch    OTHER SURGICAL HISTORY  2015    s/p resection neurofibroma RLE    OTHER SURGICAL HISTORY  2015    R kidney mass removal    TONSILLECTOMY      VOCAL CORD SURGERY  2024    Dr Fidel       Current Outpatient Medications   Medication Sig Dispense Refill    chlorthalidone  (HYGROTON ) 25 MG tablet Take 1 tablet by mouth daily 30 tablet 5    simvastatin  (ZOCOR ) 40 MG tablet Take 1 tablet by mouth nightly 90 tablet 3    carvedilol  (COREG ) 12.5 MG tablet TAKE 1 TABLET BY MOUTH TWICE DAILY WITH MEALS 180 tablet 3    amLODIPine  (NORVASC ) 10 MG tablet Take 1 tablet by mouth daily 90 tablet 3    losartan  (COZAAR ) 50 MG tablet TAKE 2 TABLETS BY MOUTH IN THE MORNING AND 1 TABLET IN THE EVENING 270 tablet 3    Icosapent  Ethyl (VASCEPA ) 1 g CAPS capsule Take 2 capsules by mouth 2 times daily 120 capsule 11    Multiple Vitamin (MULTIVITAMIN PO) Take 1 tablet by mouth daily      aspirin 81 MG chewable tablet Take 1 tablet by mouth daily      triamcinolone (NASACORT) 55 MCG/ACT nasal inhaler INSTILL 2 SPRAYS INTO EACH NOSTRIL DAILY       No current facility-administered medications for this visit.       The patient has a family history of    Social History     Tobacco Use    Smoking status: Never     Passive exposure: Never    Smokeless tobacco: Never   Vaping Use    Vaping status: Never Used   Substance Use Topics    Alcohol use: Yes     Alcohol/week: 2.0 standard drinks of alcohol     Types: 2  Glasses of wine per week    Drug use: No       Lab Results   Component Value Date/Time    CHOL 137 10/31/2023 08:04 AM    HDL 57 10/31/2023 08:04 AM    LDL 70 10/31/2023 08:04 AM    LDL 56.2 10/19/2021 08:20 AM        BP Readings from Last 3 Encounters:   11/27/23 125/62   11/08/23 136/75   05/16/23 (!) 140/80        Pulse Readings from Last 3 Encounters:   11/27/23 52   11/08/23 (!) 49   05/16/23 53       Wt Readings from Last 3 Encounters:   11/27/23 83.5 kg (184 lb)   11/08/23 85.3 kg (188  lb)   05/16/23 85.3 kg (188 lb)         EKG: sinus bradycardia, RBBB, 1st degree AV block, unchanged from previous tracings.     Folashade Gamboa K Dory Verdun, MD   11/27/2023   "

## 2023-11-27 NOTE — Progress Notes (Deleted)
 "Room:    Raymond [name],January 19, 1946,78 y.o. presents today for No chief complaint on file.    ERE:Ilfjmjw, Raymond [name], [name]  Last office Visit:05/16/23  Last EKG:05/16/23    Device:  INR:    Cardiac Symptoms:{cardiac symptoms:83754}    Wt Readings from Last 3 Encounters:   11/08/23 85.3 kg (188 lb)   05/16/23 85.3 kg (188 lb)   05/03/23 84.8 kg (187 lb)        There were no vitals filed for this visit.     Since your last visit:  Have you been to the ER or Hospitalized? no        Have you had cardiac tests at non [employer] facilities? no     Recent Testing  Echo:  Stress test:  Carotids:  Ct Chest/CA Score:  Event monitor:  Cardiac MRI:  Vascular:  Labs:11/08/23      Prior to Admission medications   Medication Sig Start Date End Date Taking? Authorizing Provider   chlorthalidone  (HYGROTON ) 25 MG tablet Take 1 tablet by mouth daily 11/08/23   [name], Raymond [name], [name]   simvastatin  (ZOCOR ) 40 MG tablet Take 1 tablet by mouth nightly 10/17/23   [name], Raymond [name], [name]   carvedilol  (COREG ) 12.5 MG tablet TAKE 1 TABLET BY MOUTH TWICE DAILY WITH MEALS 08/23/23   [name], Raymond [name], [name]   amLODIPine  (NORVASC ) 10 MG tablet Take 1 tablet by mouth daily 05/16/23   [name], Raymond K, [name]   losartan  (COZAAR ) 50 MG tablet TAKE 2 TABLETS BY MOUTH IN THE MORNING AND 1 TABLET IN THE EVENING 04/30/23   [name], Raymond E, APRN - NP   Icosapent  Ethyl (VASCEPA ) 1 g CAPS capsule Take 2 capsules by mouth 2 times daily 04/13/23   [name], Raymond L, [name]   Multiple Vitamin (MULTIVITAMIN PO) Take 1 tablet by mouth daily    Automatic Reconciliation, Ar   aspirin 81 MG chewable tablet Take 1 tablet by mouth daily    Provider, Historical, [name]   triamcinolone (NASACORT) 55 MCG/ACT nasal inhaler INSTILL 2 SPRAYS INTO EACH NOSTRIL DAILY 10/25/12   Provider, Historical, [name]      ASSESSMENT and PLAN     The primary encounter diagnosis was Ventricular tachycardia (HCC). Diagnoses of Atherosclerosis of native coronary artery without angina pectoris, unspecified whether native or  transplanted heart and Shortness of breath were also pertinent to this visit.     Raymond [name], previous Dr. Jolene patient, has history of CAD.  Back in January 2006, he presented with sinking feeling and nonsustained VT.  Ultimately, he underwent CABG with LIMA to LAD as well as sequential SVG to diagonal and OM branch.  Since that time, he has had nuclear scan in 2012 which showed EF of 55% with fixed inferior defect.  His nuclear scan in February 2018 showed inferior fixed defect without ischemia.  His EF was noted to be 52%.  Because of bradycardia, he did have Holter monitor in February 2018 which showed asymptomatic sinus bradycardia.  His minimum heart rate was 41 bpm.  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  He does have history hypertension, and hyperlipidemia.  He denies active tobacco use.  He has chronic renal disease with his creatinine in July 2020 to be about 1.65 from baseline of 1.36.  Diuretic regimen was decreased at that time.  In March 2022, he had nuclear scan  performed which revealed EF 52% with medium size inferior/inferolateral fixed defect.  This was unchanged from 2012 study.  In 2024, he was diagnosed with vocal cord paralysis.  He is scheduled to undergo laryngoplasty by Dr. Norleen [name] in October 2024.  In May 2024, his nuclear scan showed EF 48% with fixed inferobasal defect unchanged from 2012, and 2022 study.  His echocardiogram in May 2024 revealed EF 45-50% with mild MR.     Medication regimen reviewed and current cardiac regimen will be continued.  All new testing since the last office visit was independently reviewed by me.     CAD:    Clinically stable.  HTN:   Initially elevated at 170/82.  On recheck, his blood pressure was down to 140/80.  I did increase his amlodipine  from 5 mg daily up to 10 mg daily.  Rhythm:    Sinus bradycardia at 53 bpm with first-degree AVB, rare PVC.   Chronic RBBB pattern noted.  CHF:    There is no evidence of decompensated CHF noted.  BMI:     His weight today is 188 pounds.  His baseline weight is 190 pounds.  Hyperlipidemia:   Target LDL <70.  Continue Zocor  40 mg daily.  Anti-platelet:   Remains on ASA.      They have noted a mass behind his thyroid.  Both his echocardiogram and nuclear scan in May 2024 were unremarkable.     I will see him back in 6 months.  Thank you.                                                   "

## 2023-11-29 ENCOUNTER — Ambulatory Visit: Admit: 2023-11-29 | Payer: PRIVATE HEALTH INSURANCE | Attending: Physician Assistant | Primary: Internal Medicine

## 2023-11-29 VITALS — Ht 69.0 in | Wt 185.0 lb

## 2023-11-29 DIAGNOSIS — N400 Enlarged prostate without lower urinary tract symptoms: Principal | ICD-10-CM

## 2023-11-29 LAB — AMB POC URINALYSIS DIP STICK AUTO W/O MICRO
Bilirubin, Urine, POC: NEGATIVE
Glucose, Urine, POC: NEGATIVE
Ketones, Urine, POC: NEGATIVE
Leukocyte Esterase, Urine, POC: NEGATIVE
Nitrite, Urine, POC: NEGATIVE
Protein, Urine, POC: NEGATIVE
Specific Gravity, Urine, POC: 1.015 (ref 1.001–1.035)
Urobilinogen, POC: 2
pH, Urine, POC: 7 (ref 4.6–8.0)

## 2023-11-29 LAB — AMB POC PVR, MEAS,POST-VOID RES,US,NON-IMAGING: PVR, POC: 0 cc

## 2023-11-29 NOTE — Progress Notes (Signed)
 "Raymond [name]  1945/10/23        DR. Hellen is the supervising physician for this day.       Diagnosis Orders   1. BPH without obstruction/lower urinary tract symptoms  AMB POC PVR, MEAS,POST-VOID RES,US ,NON-IMAGING    AMB POC URINALYSIS DIP STICK AUTO W/O MICRO      2. Elevated PSA        3. Microhematuria        4. Prostate cancer screening                  ASSESSMENT:   UA today: trace blood  PVR today: 0 cc      1. BPH wo LUTS              No medical therapy           2. Elevated PSA              Reviewed PSA from 11/22/23- 5.38  10/12/22- 4.5     04/07/22- 3.99  09/30/21- 5.05  09/30/20- 4.42   07/09/2019 - 3.89ng/mL   04/15/19- 4.87   02/10/19- 5.3              DRE 11/29/23: 40 grams, benign   Repeat PSA in 6 months, if high then MRI    3. Microhematuria   Microscopy 10/24/21- no blood   Urine sent for microscopy, if + then CTU and cysto      4. Hx of vocalized vocal chord surgery 11/2022     5. Sees hematologist- DR. Deeb- unsure of what is going on- may need bone marrow analysis          Patient has undulating PSA- stable, still below max for age range.     PSA on nurse schedule in 6 months      RTC in 1 year with PSA prior and DRE, pending labs    Follow-up and Dispositions    Return for psa prior on nurse schedule in 6 month, see me in 1 year with PSA prior, PVR, DRE.              DISCUSSION:    Chief Complaint   Patient presents with    Benign Prostatic Hypertrophy       HISTORY OF PRESENT ILLNESS:  Raymond [name] is a 78 y.o. male  who presents today in follow up for elevated PSA. History of undulating PSA.      He is not currently taking any GU  Medications, and has no urinary complaints.       Frequency: q4-5 hrs   Nocturia: 1x   Urgency: no, no UUI   Feels like emptying bladder well   FOS: fair, weaker at night      No new bone or back pain. Old left hip pain   No unintentional weight loss   Good appetite      Denies flank pain, gross hematuria, dysuria, asymptomatic for infection. No f/c/n/v.       No family  history of prostate, bladder or kidney cancer   Never smoker   Worked in clinical research associate in [employer]            REVIEW OF LABS & IMAGING:  Lab Results   Component Value Date/Time    PSA 5.38 11/22/2023 02:01 PM    PSA 4.50 10/12/2022 10:50 AM    PSA 3.99 04/07/2022 09:48 AM    PSA 5.3 02/10/2019 11:00 AM    PSA 2.9  09/10/2017 09:35 AM                No data to display                  Past Medical History:   Diagnosis Date    Allergic rhinitis     Anemia     Dr Dorrie (5/25)    BPH with obstruction/lower urinary tract symptoms     CAD (coronary artery disease)     IMI w stent of RCA 9/03; s/p CABG X3 1/16    Cervical disc disease 2023    on xray [facility]    Cholelithiasis     CT 5/24    CKD (chronic kidney disease)     pre2016    Colon polyp 05/09/2019    Dr Gerlean    COVID-19 vaccine dose declined 04/2021    boosters    Diastasis recti     Dyslipidemia     Elevated prostate specific antigen (PSA)     [facility]    H/O cardiac catheterization 02/29/2004    oRCA 100%.  LM patent.  p/mLAD 85%.  D1 90%.  D2 90%.  CX 45%.  LVEDP 12 mmHg.  EF 45%.  CABG recommended.    H/O cardiovascular stress test     Intermed risk, basal inf infarct w/mild-mod peri-infarct isch, mid inf/prox & mid inf/lat walls.  EF unreadable (3/15); NST ef 56%, tid 0.96, inf/lat fixed defect, neg isch (3/22); NST low risk, ef 48%, TID 0.88 (8/24)    H/O echocardiogram     LVE.  EF 45-50%.  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    presented with NSVT; LIMA to LAD, seq SVG to DM and OM; ef 45%    SCC (squamous cell carcinoma)     Thyroid mass 02/2022    [facility] L[facility]; LEFT 4.8cm and 1.6cm; bx neg (4/24) [facility]; Dr Gladis now following    Ventricular tachycardia, nonsustained [facility])     Vocal cord paralysis 2024    RIGHT; [facility] Gao/Dr Adele; Dr Gladis; Dr Fidel       Past Surgical History:    Procedure Laterality Date    COLONOSCOPY N/A 05/09/2019    cologuard+ (1/21); Dr Gerlean (05/09/19) hyperplastic    CORONARY ANGIOPLASTY WITH STENT PLACEMENT  10/12/01    RCA stented using 3.5 x 13 mm Express 2 stent    CORONARY ARTERY BYPASS GRAFT  03/03/04    LIMA to the LAD. Sequential SVG to the diagonal and obtuse marginal branch    OTHER SURGICAL HISTORY  2015    s/p resection neurofibroma RLE    OTHER SURGICAL HISTORY  2015    R kidney mass removal    TONSILLECTOMY      VOCAL CORD SURGERY  2024    Dr Fidel       Social History     Tobacco Use    Smoking status: Never     Passive exposure: Never    Smokeless tobacco: Never   Vaping Use    Vaping status: Never Used   Substance Use Topics    Alcohol use: Yes     Alcohol/week: 2.0 standard drinks of alcohol     Types: 2 Glasses of  wine per week    Drug use: No       Allergies   Allergen Reactions    Ace Inhibitors Cough    Bee Venom Hives       Family History   Problem Relation Age of Onset    Heart Disease Father         x3 heart bypass    Hypertension Sister     Osteoarthritis Mother        Current Outpatient Medications   Medication Sig Dispense Refill    chlorthalidone  (HYGROTON ) 25 MG tablet Take 1 tablet by mouth daily 30 tablet 5    simvastatin  (ZOCOR ) 40 MG tablet Take 1 tablet by mouth nightly 90 tablet 3    carvedilol  (COREG ) 12.5 MG tablet TAKE 1 TABLET BY MOUTH TWICE DAILY WITH MEALS 180 tablet 3    amLODIPine  (NORVASC ) 10 MG tablet Take 1 tablet by mouth daily 90 tablet 3    losartan  (COZAAR ) 50 MG tablet TAKE 2 TABLETS BY MOUTH IN THE MORNING AND 1 TABLET IN THE EVENING 270 tablet 3    Icosapent  Ethyl (VASCEPA ) 1 g CAPS capsule Take 2 capsules by mouth 2 times daily 120 capsule 11    Multiple Vitamin (MULTIVITAMIN PO) Take 1 tablet by mouth daily      aspirin 81 MG chewable tablet Take 1 tablet by mouth daily      triamcinolone (NASACORT) 55 MCG/ACT nasal inhaler INSTILL 2 SPRAYS INTO EACH NOSTRIL DAILY       No current facility-administered  medications for this visit.         PHYSICAL EXAMINATION:   Ht 1.753 m (5' 9)   Wt 83.9 kg (185 lb)   BMI 27.32 kg/m    Constitutional: WDWN, Pleasant and appropriate affect, No acute distress.     CV:  No peripheral swelling noted   Respiratory: No respiratory distress or difficulties   Abdomen:  No abdominal distension noted   GU Male:  11/29/23   IMZ:Ezmpwzlf normal to visual inspection, no erythema or irritation, Sphincter with good tone, Rectum with no hemorrhoids,  fissures or masses.  Prostate is 40 grams. Anodular and smooth.    Skin: No jaundice.     Neuro/Psych:  Alert and oriented x 3. Affect appropriate.    LE: No edema   MSK: FROM      LABS TODAY:    Recent Results (from the past 12 hours)   AMB POC PVR, MEAS,POST-VOID RES,US ,NON-IMAGING    Collection Time: 11/29/23  1:37 PM   Result Value Ref Range    PVR, POC 0 cc          A copy of today's office visit with all pertinent imaging results and labs were sent to the referring physician.        Wanda Meals, PA-C  [facility]     [address] [place], [address]  [place], [place] [address]  P: [phone]   F: [phone]              "

## 2023-11-29 NOTE — Addendum Note (Signed)
"  Addended by: Eunice Winecoff on: 11/29/2023 04:02 PM     Modules accepted: Orders    "

## 2023-11-30 LAB — URINALYSIS WITH MICROSCOPIC
Bilirubin, Urine: NEGATIVE
Blood, Urine: NEGATIVE
Glucose, Ur: NEGATIVE
Ketones, Urine: NEGATIVE
Leukocyte Esterase, Urine: NEGATIVE
Nitrite, Urine: NEGATIVE
Protein, UA: NEGATIVE
Specific Gravity, UA: 1.014 (ref 1.005–1.030)
Urobilinogen, Urine: 1 mg/dL (ref 0.2–1.0)
pH, Urine: 7 (ref 5.0–7.5)

## 2023-11-30 LAB — MICROSCOPIC EXAMINATION
Bacteria, UA: NONE SEEN
Casts UA: NONE SEEN /LPF
WBC, UA: NONE SEEN /HPF (ref 0–5)

## 2023-11-30 MED ORDER — AMLODIPINE BESYLATE 5 MG PO TABS
5 | ORAL_TABLET | Freq: Every day | ORAL | 3 refills | 90.00000 days | Status: AC
Start: 2023-11-30 — End: ?

## 2023-11-30 NOTE — Telephone Encounter (Signed)
"  Patient was last seen in the office by Dr. Rea on 11/27/23. Per Dr. Donneta note:     HTN: Well-controlled at 125/62. Chlorthalidone  recently added with good effect. Plan to continue current BP regimen. Discussed decreasing amlodipine  if symptomatic hypotension occurs. Patient instructed to notify office if he develops low blood pressure or dizziness, in which case amlodipine  to be reduced from 10 mg to 5 mg daily.    Patient contacted office reporting continued low blood pressure readings accompanied by dizziness. Discussed plan per Dr. Donneta note. Amlodipine  reduced to 5 mg once daily. Prescription sent to provider for signature.    Patient advised to monitor blood pressure daily, report persistent hypotension, worsening dizziness, syncope, chest pain, or other concerns.  "

## 2023-12-03 NOTE — Telephone Encounter (Signed)
 Error

## 2023-12-03 NOTE — Result Encounter Note (Signed)
 Urine showed blood on dipstick in office, sent for microscopy, no worrisome blood seen in urine under microscope. No need for further work-up. Follow up as scheduled.

## 2024-03-03 ENCOUNTER — Ambulatory Visit: Admit: 2024-03-03 | Discharge: 2024-03-03 | Payer: BLUE CROSS/BLUE SHIELD | Primary: Internal Medicine

## 2024-03-03 ENCOUNTER — Inpatient Hospital Stay: Admit: 2024-03-03 | Payer: BLUE CROSS/BLUE SHIELD | Primary: Internal Medicine

## 2024-03-03 DIAGNOSIS — R7301 Impaired fasting glucose: Secondary | ICD-10-CM

## 2024-03-03 LAB — BASIC METABOLIC PANEL
Anion Gap: 6 mmol/L (ref 3.0–18.0)
BUN/Creatinine Ratio: 18 (ref 12–20)
BUN: 24 mg/dL — ABNORMAL HIGH (ref 6–23)
CO2: 31 mmol/L (ref 21–32)
Calcium: 9 mg/dL (ref 8.6–10.2)
Chloride: 105 mmol/L (ref 98–107)
Creatinine: 1.34 mg/dL — ABNORMAL HIGH (ref 0.6–1.3)
Est, Glom Filt Rate: 54 mL/min/{1.73_m2} — ABNORMAL LOW (ref >60)
Glucose: 113 mg/dL — ABNORMAL HIGH (ref 74–108)
Potassium: 3.6 mmol/L (ref 3.5–5.5)
Sodium: 142 mmol/L (ref 136–145)

## 2024-03-03 LAB — LIPID PANEL
Chol/HDL Ratio: 2.4 (ref 0–5.0)
Cholesterol, Total: 129 mg/dL
HDL: 53 mg/dL (ref 40–60)
LDL Cholesterol: 62 mg/dL (ref 0–100)
Triglycerides: 67 mg/dL (ref 0–150)
VLDL Cholesterol Calculated: 13 mg/dL

## 2024-03-03 LAB — HEMOGLOBIN A1C
Estimated Avg Glucose: 114 mg/dL
Hemoglobin A1C: 5.6 % (ref 4.2–5.6)

## 2024-03-10 ENCOUNTER — Encounter: Payer: BLUE CROSS/BLUE SHIELD | Attending: Internal Medicine | Primary: Internal Medicine

## 2024-03-10 NOTE — Progress Notes (Unsigned)
 79 y.o. male who presents for RPE    No cp, sob, pnd, edema, palps.  Remains active with yardwork, bowls weekly.  Saw Dr Rea 4/25 and had echo done as below.  He has noted inc swelling in the ankles after amlo was inc to 10mg     He successfully underwent vocal cord surgery by Dr Sinacori but his wife was concerned about his snoring sounding different.  He recommended using mouthguard which the pt has not started using as yet    No gi issues currently.  Sees NP Aloia for the prostate and has appt later this year    Denies polyuria, polydipsia, nocturia, vision change.  Not checking sugars at this time. Not able to lose weight     11/01/2022 11/02/2022 11/28/2022 05/03/2023 05/16/2023 11/08/2023   Vitals         Weight - Scale 189 lb  189 lb  185 lb  187 lb  188 lb  188 lb      He saw Dr Dorrie for the anemia, they talked about possible need for bm bx in the future, he has follow up in the near future     Dr Gladis is following the thyroid nodules/mass which had prior neg bx in 2024.  The allergies are flaring even on nasacort but he's not using antihistamine    He reports they will be going on a [employer] in Dec    *LAST MEDICARE WELLNESS EXAM: not medicare b    Past Medical History:   Diagnosis Date    Allergic rhinitis     Anemia     Dr Dorrie (5/25)    BPH with obstruction/lower urinary tract symptoms     CAD (coronary artery disease)     IMI w stent of RCA 9/03; s/p CABG X3 1/16    Cervical disc disease 2023    on xray [facility]OSS    Cholelithiasis     CT 5/24    CKD (chronic kidney disease)     pre2016    Colon polyp 05/09/2019    Dr Gerlean    COVID-19 vaccine dose declined 04/2021    boosters    Diastasis recti     Dyslipidemia     Elevated prostate specific antigen (PSA)     [facility]    H/O cardiac catheterization 02/29/2004    oRCA 100%.  LM patent.  p/mLAD 85%.  D1 90%.  D2 90%.  CX 45%.  LVEDP 12 mmHg.  EF 45%.  CABG recommended.    H/O cardiovascular stress test     Intermed risk, basal inf infarct w/mild-mod  peri-infarct isch, mid inf/prox & mid inf/lat walls.  EF unreadable (3/15); NST ef 56%, tid 0.96, inf/lat fixed defect, neg isch (3/22); NST low risk, ef 48%, TID 0.88 (8/24)    H/O echocardiogram     LVE.  EF 45-50%.  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    presented with NSVT; LIMA to LAD, seq SVG to DM and OM; ef 45%    SCC (squamous cell carcinoma)     Thyroid mass 02/2022    [facility] Lia; LEFT 4.8cm and 1.6cm; bx neg (4/24) [facility]; Dr Gladis now following    Ventricular tachycardia, nonsustained (  [facility])     Vocal cord paralysis 2024    RIGHT; [facility] Gao/Dr Adele; Dr Gladis; Dr Fidel     Past Surgical History:   Procedure Laterality Date    COLONOSCOPY N/A 05/09/2019    cologuard+ (1/21); Dr Gerlean (05/09/19) hyperplastic    CORONARY ANGIOPLASTY WITH STENT PLACEMENT  10/12/01    RCA stented using 3.5 x 13 mm Express 2 stent    CORONARY ARTERY BYPASS GRAFT  03/03/04    LIMA to the LAD. Sequential SVG to the diagonal and obtuse marginal branch    OTHER SURGICAL HISTORY  2015    s/p resection neurofibroma RLE    OTHER SURGICAL HISTORY  2015    R kidney mass removal    TONSILLECTOMY      VOCAL CORD SURGERY  2024    Dr Fidel     Social History     Socioeconomic History    Marital status: Married     Spouse name: Not on file    Number of children: Not on file    Years of education: Not on file    Highest education level: Not on file   Occupational History    Not on file   Tobacco Use    Smoking status: Never     Passive exposure: Never    Smokeless tobacco: Never   Vaping Use    Vaping status: Never Used   Substance and Sexual Activity    Alcohol use: Yes     Alcohol/week: 2.0 standard drinks of alcohol     Types: 2 Glasses of wine per week    Drug use: No    Sexual activity: Yes     Partners: Female   Other Topics Concern    Not on file    Social History Narrative    Not on file     Social Drivers of Health     Financial Resource Strain: Low Risk  (04/26/2022)    Overall Financial Resource Strain (CARDIA)     Difficulty of Paying Living Expenses: Not hard at all   Food Insecurity: No Food Insecurity (04/30/2023)    Hunger Vital Sign     Worried About Running Out of Food in the Last Year: Never true     Ran Out of Food in the Last Year: Never true   Transportation Needs: No Transportation Needs (04/30/2023)    PRAPARE - Therapist, Art (Medical): No     Lack of Transportation (Non-Medical): No   Physical Activity: Insufficiently Active (11/11/2021)    Exercise Vital Sign     Days of Exercise per Week: 2 days     Minutes of Exercise per Session: 60 min   Stress: Not on file   Social Connections: Not on file   Intimate Partner Violence: Not At Risk (11/11/2021)    Humiliation, Afraid, Rape, and Kick questionnaire     Fear of Current or Ex-Partner: No     Emotionally Abused: No     Physically Abused: No     Sexually Abused: No   Housing Stability: Low Risk  (04/30/2023)    Housing Stability Vital Sign     Unable to Pay for Housing in the Last Year: No     Number of Times Moved in the Last Year: 0     Homeless in the Last Year: No     Current Outpatient Medications   Medication Sig    amLODIPine  (NORVASC ) 5 MG tablet  Take 1 tablet by mouth daily    chlorthalidone  (HYGROTON ) 25 MG tablet Take 1 tablet by mouth daily    simvastatin  (ZOCOR ) 40 MG tablet Take 1 tablet by mouth nightly    carvedilol  (COREG ) 12.5 MG tablet TAKE 1 TABLET BY MOUTH TWICE DAILY WITH MEALS    losartan  (COZAAR ) 50 MG tablet TAKE 2 TABLETS BY MOUTH IN THE MORNING AND 1 TABLET IN THE EVENING    Icosapent  Ethyl (VASCEPA ) 1 g CAPS capsule Take 2 capsules by mouth 2 times daily    Multiple Vitamin (MULTIVITAMIN PO) Take 1 tablet by mouth daily    aspirin 81 MG chewable tablet Take 1 tablet by mouth daily    triamcinolone (NASACORT) 55 MCG/ACT nasal inhaler INSTILL 2  SPRAYS INTO EACH NOSTRIL DAILY     No current facility-administered medications for this visit.     Allergies   Allergen Reactions    Ace Inhibitors Cough    Bee Venom Hives     REVIEW OF SYSTEMS: colo 4/21 Dr Gerlean    There were no vitals filed for this visit.  A&O x3  Affect is appropriate.  Mood stable  No apparent distress  Anicteric, no JVD, adenopathy or thyromegaly.   No carotid bruits or radiated murmur  Lungs clear to auscultation, no wheezes or rales  Heart showed regular rate and rhythm. No murmur, rubs, gallops  Abdomen soft nontender, no hepatosplenomegaly or masses.  Diastasis noted  Extremities with 2+ bilat edema.  Pulses 1-2+ symmetrically    LABS  From 2/16 showed gluc 101, cr 1.28, gfr 56,  alt 12,    chol 143, tg 65, hdl 61, ldl-c 69, wbc 5.0, hb 13.3, plt 175, tsh 2.94, ua neg  From 2/17 showed gluc 96,   cr 1.19, gfr>60, alt 29, hba1c 5.4, umar 5.0,  chol 170, tg 65, hdl 66, ldl-c 91, wbc 5.3, hb 13.6, plt 184, tsh 2.92, ua neg  From 2/18 showed gluc 104, cr 1.33, gfr 53,  alt 13, hba1c 5.6, umar 6.0,  chol 121, tg 51, hdl 58, ldl-c 53, wbc 5.2, hb 13.6, plt 177, tsh 3.09  From 8/18 showed gluc 104, cr 1.26, gfr 56,  alt 33,    chol 135, tg 58, hdl 61, ldl-c 62, wbc 6.0, hb 13.6, plt 180  From 8/19 showed gluc 97,   cr 1.36, gfr 51,     chol 134, tg 82, hdl 51, ldl-c 67,                 psa 2.90  From 2/20 showed gluc 96,   cr 1.28, gfr 55,  alt 24,    chol 137, tg 66, hdl 57, ldl-c 67  From 7/20 showed gluc 93,   cr 1.67, gfr 40,  alt 27,     chol 151, tg 95, hdl 58, ldl-c 74  From 1/21 showed gluc 95,   cr 1.74, gfr 39,  alt 25, hba1c 5.5,   chol 134, tg 84, hdl 50, ldl-c 67, wbc 6.0, hb 12.9, plt 194,              psa 5.30  From 4/21 showed gluc 98,   cr 1.37, gfr 51  From 8/21 showed gluc 106, cr 1.28, gfr 55,  alt 21, hba1c 5.3   chol 125, tg 45, hdl 63, ldl-c 53  From 2/22 showed gluc 97,   cr 1.23, gfr 57,  hba1c 5.4,  wbc 5.6, hb 12.0, plt 175  From 3/22 showed                             fe 70, %sat 24, ferritin 70, b12 434, fol>20, spep neg  From 8/22 showed                  psa 4.42  From 9/22 showed gluc 94,   cr 1.46, gfr 47, alt 17, hba1c 5.3,   chol 133, tg 64, hdl 58, ldl-c 62, wbc 6.4, hb 12.9, plt 165  From 3/23 showed gluc 103, cr 1.29, gfr 57,             wbc 5.7, hb 13.1, plt 177  From 9/23 showed gluc 106, cr 1.33, gfr 55, alt 33, hba1c 5.2,   chol 133, tg 59, hdl 65, ldl-c 56,              ua neg  From 3/24 showed gluc 105, cr 1.28, gfr 58,  hba1c 5.4,           wbc 4.8, hb 13.2, plt 161,              psa 3.99  From 9/24 showed gluc 106, cr 1.33, gfr 55, alt 23, hba1c 5.5,              chol 124, tg 36, hdl 69, ldl-c 48, wbc 5.4, hb 12.8, plt 169,   psa 4.50, tsh 2.87, ft4 1.00  From 3/25 showed gluc 104, cr 1.31, gfr 56,             hba1c 5.4,          wbc 4.5, hb 12.8, plt 173  From 9/25 showed gluc 99,   cr 1.31, gfr 55, alt 18, hba1c 5.4,   chol 137, tg 50, hdl 57, ldl-c 70, wbc 5.3, hb 12.0, plt 173    No results found for this or any previous visit (from the past 2160 hours).    We reviewed the patient's labs from the last several visits to point out trends in the numbers        Patient Active Problem List   Diagnosis    CKD (chronic kidney disease) stage 3, GFR 30-59 ml/min (HCC)    Coronary atherosclerosis of native coronary artery    Elevated prostate specific antigen (PSA)    BPH with obstruction/lower urinary tract symptoms    Hyperlipidemia    Colon polyp    Primary hypertension    Impaired fasting glucose    Thyroid mass    Vocal cord paralysis     Assessment and plan:  1.  CHD.  F/U Dr Rea  2.  BPH/LUTS, elev psa.  F/u PA Aloia  3.  HLP.  Continue current regimen.  4.  HTN.  Continue current but will add diuretic for edema and possibly cut down amlo dosing in future; follow up 4 mos  5.  IFG.  Lifestyle and dietary measures. Weight loss would be ideal  6.  CKD.  Stable since 2016 at least  7.  Colon polyps.  Fiber, colonoscopy 2026  8.  Thyroid nodules/mass.   Per Dr Gladis  9.  Vocal cord paralysis, chronic hoarseness, s/p surgery.  Follow up Dr Sinacori  10.  Anemia and borderline leukocytosis. Per Dr Dorrie  11.  Allergies.  Add antihis  12.  Advocated RSV      RTC  2/26    Instructions above were handwritten on the lab results sheet that I gave the patient today    Above conditions discussed at length and patient vocalized understanding.  All questions answered to patient satisfaction    {No diagnosis found. (Refresh or delete this SmartLink)}

## 2024-03-13 ENCOUNTER — Ambulatory Visit
Admit: 2024-03-13 | Discharge: 2024-03-13 | Payer: BLUE CROSS/BLUE SHIELD | Attending: Internal Medicine | Primary: Internal Medicine

## 2024-03-13 DIAGNOSIS — R7301 Impaired fasting glucose: Principal | ICD-10-CM

## 2024-03-13 NOTE — Progress Notes (Signed)
 Alm Sharps presents today for   Chief Complaint   Patient presents with    4 Month Follow-Up    Hypertension       Have you been to the ER, urgent care clinic since your last visit?  Hospitalized since your last visit?    NO    Have you seen or consulted any other health care providers outside of [facility] since your last visit?    YES - When: approximately 3  weeks ago.  Where and Why: [facility].

## 2024-03-13 NOTE — Progress Notes (Signed)
 79 y.o. male who presents for evaluation     No cp, sob, pnd, edema, palps.  Remains active with yardwork when it's warm, bowls weekly. We dec amlo to 5 and added chlorthalidone  and his bp is better and the swelling is down.  Saw Dr Rea recently and no new recs, bp at home running in the 100-110 systolic by report     No gi issues currently.  Sees NP Aloia for the prostate and has appt later this spring    Denies polyuria, polydipsia, nocturia, vision change.  Not checking sugars at this time. Not able to lose weight     05/16/2023 11/08/2023 11/27/2023 11/29/2023 03/13/2024   Vitals        Weight - Scale 188 lb  188 lb  184 lb  185 lb  187 lb      He sees Dr Dorrie for the anemia    Dr Gladis is following the thyroid nodules/mass which had prior neg bx in 2024.      *LAST MEDICARE WELLNESS EXAM: not medicare b    Past Medical History:   Diagnosis Date    Allergic rhinitis     Anemia     Dr Dorrie (5/25)    BPH with obstruction/lower urinary tract symptoms     CAD (coronary artery disease)     IMI w stent of RCA 9/03; s/p CABG X3 1/16    Cervical disc disease 2023    on xray [facility]    Cholelithiasis     CT 5/24    CKD (chronic kidney disease)     pre2016    Colon polyp 05/09/2019    Dr Gerlean    COVID-19 vaccine dose declined 04/2021    boosters    Diastasis recti     Dyslipidemia     Elevated prostate specific antigen (PSA)     [facility]    H/O cardiac catheterization 02/29/2004    oRCA 100%.  LM patent.  p/mLAD 85%.  D1 90%.  D2 90%.  CX 45%.  LVEDP 12 mmHg.  EF 45%.  CABG recommended.    H/O cardiovascular stress test     Intermed risk, basal inf infarct w/mild-mod peri-infarct isch, mid inf/prox & mid inf/lat walls.  EF unreadable (3/15); NST ef 56%, tid 0.96, inf/lat fixed defect, neg isch (3/22); NST low risk, ef 48%, TID 0.88 (8/24)    H/O echocardiogram     LVE.  EF 45-50%.  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    presented with NSVT; LIMA to LAD, seq SVG to DM and OM; ef 45%    SCC (squamous cell carcinoma)     Thyroid mass 02/2022    PA Lia; LEFT 4.8cm and 1.6cm; bx neg (4/24) [facility]; Dr Gladis now following    Ventricular tachycardia, nonsustained [facility])     Vocal cord paralysis 2024    RIGHT; PA Gao/Dr Adele; Dr Gladis; Dr Fidel     Past Surgical History:   Procedure Laterality Date    COLONOSCOPY N/A 05/09/2019    cologuard+ (1/21); Dr Gerlean (05/09/19) hyperplastic    CORONARY ANGIOPLASTY WITH STENT PLACEMENT  10/12/01    RCA stented using 3.5 x 13 mm Express 2 stent  CORONARY ARTERY BYPASS GRAFT  03/03/04    LIMA to the LAD. Sequential SVG to the diagonal and obtuse marginal branch    OTHER SURGICAL HISTORY  2015    s/p resection neurofibroma RLE    OTHER SURGICAL HISTORY  2015    R kidney mass removal    TONSILLECTOMY      VOCAL CORD SURGERY  2024    Dr Fidel     Social History     Socioeconomic History    Marital status: Married     Spouse name: Not on file    Number of children: Not on file    Years of education: Not on file    Highest education level: Not on file   Occupational History    Not on file   Tobacco Use    Smoking status: Never     Passive exposure: Never    Smokeless tobacco: Never   Vaping Use    Vaping status: Never Used   Substance and Sexual Activity    Alcohol use: Yes     Alcohol/week: 2.0 standard drinks of alcohol     Types: 2 Glasses of wine per week    Drug use: No    Sexual activity: Yes     Partners: Female   Other Topics Concern    Not on file   Social History Narrative    Not on file     Social Drivers of Health     Financial Resource Strain: Low Risk  (04/26/2022)    Overall Financial Resource Strain (CARDIA)     Difficulty of Paying Living Expenses: Not hard at all   Food Insecurity: No Food Insecurity (04/30/2023)    Hunger Vital Sign     Worried About Running Out of Food in the Last Year: Never true      Ran Out of Food in the Last Year: Never true   Transportation Needs: No Transportation Needs (04/30/2023)    PRAPARE - Therapist, Art (Medical): No     Lack of Transportation (Non-Medical): No   Physical Activity: Insufficiently Active (11/11/2021)    Exercise Vital Sign     Days of Exercise per Week: 2 days     Minutes of Exercise per Session: 60 min   Stress: Not on file   Social Connections: Not on file   Intimate Partner Violence: Not At Risk (11/11/2021)    Humiliation, Afraid, Rape, and Kick questionnaire     Fear of Current or Ex-Partner: No     Emotionally Abused: No     Physically Abused: No     Sexually Abused: No   Housing Stability: Low Risk  (04/30/2023)    Housing Stability Vital Sign     Unable to Pay for Housing in the Last Year: No     Number of Times Moved in the Last Year: 0     Homeless in the Last Year: No     Current Outpatient Medications   Medication Sig    Loratadine-Pseudoephedrine (CLARITIN-D 12 HOUR PO) Take 1 tablet by mouth daily as needed    amLODIPine  (NORVASC ) 5 MG tablet Take 1 tablet by mouth daily    chlorthalidone  (HYGROTON ) 25 MG tablet Take 1 tablet by mouth daily    simvastatin  (ZOCOR ) 40 MG tablet Take 1 tablet by mouth nightly    carvedilol  (COREG ) 12.5 MG tablet TAKE 1 TABLET BY MOUTH TWICE DAILY WITH MEALS    losartan  (COZAAR ) 50  MG tablet TAKE 2 TABLETS BY MOUTH IN THE MORNING AND 1 TABLET IN THE EVENING    Icosapent  Ethyl (VASCEPA ) 1 g CAPS capsule Take 2 capsules by mouth 2 times daily    Multiple Vitamin (MULTIVITAMIN PO) Take 1 tablet by mouth daily    aspirin 81 MG chewable tablet Take 1 tablet by mouth daily    triamcinolone (NASACORT) 55 MCG/ACT nasal inhaler 1 spray by Nasal route as needed     No current facility-administered medications for this visit.     Allergies   Allergen Reactions    Ace Inhibitors Cough    Bee Venom Hives     REVIEW OF SYSTEMS: colo 4/21 Dr Gerlean    Vitals:    03/13/24 1321 03/13/24 1324   BP: (!) 148/76 135/70    BP Site: Left Upper Arm Right Upper Arm   Patient Position: Sitting Sitting   BP Cuff Size: Medium Adult Medium Adult   Pulse: 51 50   Resp: 16    Temp: 98.1 F (36.7 C)    TempSrc: Temporal    SpO2: 100%    Weight: 84.8 kg (187 lb)    Height: 1.753 m (5' 9)    I got 118/67 manually  A&O x3  Affect is appropriate.  Mood stable  No apparent distress  Anicteric, no JVD, adenopathy or thyromegaly.   No carotid bruits or radiated murmur  Lungs clear to auscultation, no wheezes or rales  Heart showed regular rate and rhythm. No murmur, rubs, gallops  Abdomen soft nontender, no hepatosplenomegaly or masses.  Diastasis noted  Extremities with 2+ bilat edema.  Pulses 1-2+ symmetrically    LABS  From 2/16 showed gluc 101, cr 1.28, gfr 56,  alt 12,    chol 143, tg 65, hdl 61, ldl-c 69, wbc 5.0, hb 13.3, plt 175, tsh 2.94, ua neg  From 2/17 showed gluc 96,   cr 1.19, gfr>60, alt 29, hba1c 5.4, umar 5.0,  chol 170, tg 65, hdl 66, ldl-c 91, wbc 5.3, hb 13.6, plt 184, tsh 2.92, ua neg  From 2/18 showed gluc 104, cr 1.33, gfr 53,  alt 13, hba1c 5.6, umar 6.0,  chol 121, tg 51, hdl 58, ldl-c 53, wbc 5.2, hb 13.6, plt 177, tsh 3.09  From 8/18 showed gluc 104, cr 1.26, gfr 56,  alt 33,    chol 135, tg 58, hdl 61, ldl-c 62, wbc 6.0, hb 13.6, plt 180  From 8/19 showed gluc 97,   cr 1.36, gfr 51,     chol 134, tg 82, hdl 51, ldl-c 67,                 psa 2.90  From 2/20 showed gluc 96,   cr 1.28, gfr 55,  alt 24,    chol 137, tg 66, hdl 57, ldl-c 67  From 7/20 showed gluc 93,   cr 1.67, gfr 40,  alt 27,     chol 151, tg 95, hdl 58, ldl-c 74  From 1/21 showed gluc 95,   cr 1.74, gfr 39,  alt 25, hba1c 5.5,   chol 134, tg 84, hdl 50, ldl-c 67, wbc 6.0, hb 12.9, plt 194,              psa 5.30  From 4/21 showed gluc 98,   cr 1.37, gfr 51  From 8/21 showed gluc 106, cr 1.28, gfr 55,  alt 21, hba1c 5.3   chol 125, tg 45, hdl 63, ldl-c  53  From 2/22 showed gluc 97,   cr 1.23, gfr 57,  hba1c 5.4,           wbc 5.6, hb 12.0, plt 175  From  3/22 showed                            fe 70, %sat 24, ferritin 70, b12 434, fol>20, spep neg  From 8/22 showed                  psa 4.42  From 9/22 showed gluc 94,   cr 1.46, gfr 47, alt 17, hba1c 5.3,   chol 133, tg 64, hdl 58, ldl-c 62, wbc 6.4, hb 12.9, plt 165  From 3/23 showed gluc 103, cr 1.29, gfr 57,             wbc 5.7, hb 13.1, plt 177  From 9/23 showed gluc 106, cr 1.33, gfr 55, alt 33, hba1c 5.2,   chol 133, tg 59, hdl 65, ldl-c 56,              ua neg  From 3/24 showed gluc 105, cr 1.28, gfr 58,  hba1c 5.4,           wbc 4.8, hb 13.2, plt 161,             psa 3.99  From 9/24 showed gluc 106, cr 1.33, gfr 55, alt 23, hba1c 5.5,              chol 124, tg 36, hdl 69, ldl-c 48, wbc 5.4, hb 12.8, plt 169,             psa 4.50, tsh 2.87, ft4 1.00  From 3/25 showed gluc 104, cr 1.31, gfr 56,             hba1c 5.4,          wbc 4.5, hb 12.8, plt 173  From 9/25 showed gluc 99,   cr 1.31, gfr 55, alt 18, hba1c 5.4,   chol 137, tg 50, hdl 57, ldl-c 70, wbc 5.3, hb 12.0, plt 173    Results for orders placed or performed during the hospital encounter of 03/03/24 (from the past 2160 hours)   Basic Metabolic Panel   Result Value Ref Range    Sodium 142 136 - 145 mmol/L    Potassium 3.6 3.5 - 5.5 mmol/L    Chloride 105 98 - 107 mmol/L    CO2 31 21 - 32 mmol/L    Anion Gap 6 3.0 - 18.0 mmol/L    Glucose 113 (H) 74 - 108 mg/dL    BUN 24 (H) 6 - 23 MG/DL    Creatinine 8.65 (H) 0.6 - 1.3 MG/DL    BUN/Creatinine Ratio 18 12 - 20      Est, Glom Filt Rate 54 (L) >60 ml/min/1.80m2    Calcium 9.0 8.6 - 10.2 MG/DL   Hemoglobin J8R   Result Value Ref Range    Hemoglobin A1C 5.6 4.2 - 5.6 %    Estimated Avg Glucose 114 mg/dL   Lipid Panel   Result Value Ref Range    Cholesterol, Total 129 MG/DL    Triglycerides 67 0 - 150 MG/DL    HDL 53 40 - 60 MG/DL    LDL Cholesterol 62 0 - 100 MG/DL    VLDL Cholesterol Calculated 13 MG/DL    Chol/HDL Ratio 2.4 0 - 5.0  We reviewed the patient's labs from the last several visits to point  out trends in the numbers      Patient Active Problem List   Diagnosis    CKD (chronic kidney disease) stage 3, GFR 30-59 ml/min (HCC)    Coronary atherosclerosis of native coronary artery    Elevated prostate specific antigen (PSA)    BPH with obstruction/lower urinary tract symptoms    Hyperlipidemia    Colon polyp    Primary hypertension    Impaired fasting glucose    Thyroid mass    Vocal cord paralysis     Assessment and plan:  1.  CHD.  F/U Dr Rea  2.  BPH/LUTS, elev psa.  F/u PA Aloia  3.  HLP.  Continue current regimen.  4.  HTN.  Continue current, can try weaning down or off the amlo if the edema is worse assuming bp ok  5.  IFG.  Lifestyle and dietary measures. Weight loss would be ideal  6.  CKD.  Stable since 2016 at least  7.  Colon polyps.  Fiber, colonoscopy 2026  8.  Thyroid nodules/mass.  Per Dr Gladis  9.  Vocal cord paralysis, chronic hoarseness, s/p surgery.  Follow up Dr Sinacori  10.  Anemia and borderline leukocytosis. Per Dr Dorrie  11.  Allergies.  Add antihis      RTC 8/26    Instructions above were handwritten on the lab results sheet that I gave the patient today    Above conditions discussed at length and patient vocalized understanding.  All questions answered to patient satisfaction     Diagnosis Orders   1. Impaired fasting glucose  Comprehensive Metabolic Panel    Hemoglobin A1C      2. Atherosclerosis of native coronary artery of native heart without angina pectoris        3. Primary hypertension        4. BPH with obstruction/lower urinary tract symptoms        5. Stage 3a chronic kidney disease (HCC)        6. Hyperlipidemia, unspecified hyperlipidemia type
